# Patient Record
Sex: Male | Born: 1955 | Race: White | Hispanic: No | Marital: Married | Smoking: Never smoker
Health system: Southern US, Community
[De-identification: ages and names within clinical notes are randomized; demographics above are authoritative.]

## PROBLEM LIST (undated history)

## (undated) DIAGNOSIS — T7840XA Allergy, unspecified, initial encounter: Secondary | ICD-10-CM

## (undated) DIAGNOSIS — E039 Hypothyroidism, unspecified: Secondary | ICD-10-CM

## (undated) DIAGNOSIS — R7303 Prediabetes: Secondary | ICD-10-CM

## (undated) DIAGNOSIS — F419 Anxiety disorder, unspecified: Secondary | ICD-10-CM

## (undated) DIAGNOSIS — N50819 Testicular pain, unspecified: Secondary | ICD-10-CM

## (undated) DIAGNOSIS — E785 Hyperlipidemia, unspecified: Secondary | ICD-10-CM

## (undated) DIAGNOSIS — D496 Neoplasm of unspecified behavior of brain: Secondary | ICD-10-CM

## (undated) DIAGNOSIS — F32A Depression, unspecified: Secondary | ICD-10-CM

## (undated) DIAGNOSIS — F329 Major depressive disorder, single episode, unspecified: Secondary | ICD-10-CM

## (undated) DIAGNOSIS — K219 Gastro-esophageal reflux disease without esophagitis: Secondary | ICD-10-CM

## (undated) DIAGNOSIS — R51 Headache: Secondary | ICD-10-CM

## (undated) DIAGNOSIS — R0600 Dyspnea, unspecified: Secondary | ICD-10-CM

## (undated) DIAGNOSIS — E079 Disorder of thyroid, unspecified: Secondary | ICD-10-CM

## (undated) DIAGNOSIS — I669 Occlusion and stenosis of unspecified cerebral artery: Secondary | ICD-10-CM

## (undated) DIAGNOSIS — J189 Pneumonia, unspecified organism: Secondary | ICD-10-CM

## (undated) DIAGNOSIS — J45909 Unspecified asthma, uncomplicated: Secondary | ICD-10-CM

## (undated) DIAGNOSIS — M1990 Unspecified osteoarthritis, unspecified site: Secondary | ICD-10-CM

## (undated) DIAGNOSIS — C61 Malignant neoplasm of prostate: Principal | ICD-10-CM

## (undated) HISTORY — PX: OTHER SURGICAL HISTORY: SHX169

## (undated) HISTORY — DX: Unspecified asthma, uncomplicated: J45.909

## (undated) HISTORY — DX: Hyperlipidemia, unspecified: E78.5

## (undated) HISTORY — DX: Neoplasm of unspecified behavior of brain: D49.6

## (undated) HISTORY — DX: Allergy, unspecified, initial encounter: T78.40XA

## (undated) HISTORY — DX: Gastro-esophageal reflux disease without esophagitis: K21.9

## (undated) HISTORY — DX: Major depressive disorder, single episode, unspecified: F32.9

## (undated) HISTORY — DX: Depression, unspecified: F32.A

---

## 1991-12-31 DIAGNOSIS — D496 Neoplasm of unspecified behavior of brain: Secondary | ICD-10-CM

## 1991-12-31 HISTORY — PX: OTHER SURGICAL HISTORY: SHX169

## 1991-12-31 HISTORY — DX: Neoplasm of unspecified behavior of brain: D49.6

## 1999-10-11 ENCOUNTER — Encounter: Admission: RE | Admit: 1999-10-11 | Discharge: 1999-10-11 | Payer: Self-pay | Admitting: *Deleted

## 2001-05-08 ENCOUNTER — Encounter: Payer: Self-pay | Admitting: Endocrinology

## 2001-05-08 ENCOUNTER — Encounter: Admission: RE | Admit: 2001-05-08 | Discharge: 2001-05-08 | Payer: Self-pay | Admitting: Endocrinology

## 2003-07-08 ENCOUNTER — Encounter: Payer: Self-pay | Admitting: Endocrinology

## 2003-07-08 ENCOUNTER — Encounter: Admission: RE | Admit: 2003-07-08 | Discharge: 2003-07-08 | Payer: Self-pay | Admitting: Endocrinology

## 2004-11-15 ENCOUNTER — Ambulatory Visit: Payer: Self-pay | Admitting: Internal Medicine

## 2004-12-17 ENCOUNTER — Ambulatory Visit: Payer: Self-pay | Admitting: Internal Medicine

## 2006-02-10 ENCOUNTER — Ambulatory Visit: Payer: Self-pay | Admitting: Internal Medicine

## 2006-03-12 ENCOUNTER — Ambulatory Visit: Payer: Self-pay | Admitting: Internal Medicine

## 2006-03-13 ENCOUNTER — Encounter: Admission: RE | Admit: 2006-03-13 | Discharge: 2006-03-13 | Payer: Self-pay | Admitting: Internal Medicine

## 2006-07-29 ENCOUNTER — Ambulatory Visit (HOSPITAL_COMMUNITY): Admission: RE | Admit: 2006-07-29 | Discharge: 2006-07-29 | Payer: Self-pay | Admitting: Endocrinology

## 2006-11-18 ENCOUNTER — Ambulatory Visit: Payer: Self-pay | Admitting: Internal Medicine

## 2007-04-23 ENCOUNTER — Ambulatory Visit: Payer: Self-pay | Admitting: Internal Medicine

## 2008-03-18 ENCOUNTER — Ambulatory Visit: Payer: Self-pay | Admitting: Internal Medicine

## 2008-03-18 DIAGNOSIS — M25519 Pain in unspecified shoulder: Secondary | ICD-10-CM

## 2008-03-18 DIAGNOSIS — M542 Cervicalgia: Secondary | ICD-10-CM

## 2008-03-18 DIAGNOSIS — E039 Hypothyroidism, unspecified: Secondary | ICD-10-CM | POA: Insufficient documentation

## 2008-03-18 DIAGNOSIS — J45909 Unspecified asthma, uncomplicated: Secondary | ICD-10-CM

## 2008-05-02 ENCOUNTER — Telehealth (INDEPENDENT_AMBULATORY_CARE_PROVIDER_SITE_OTHER): Payer: Self-pay | Admitting: *Deleted

## 2008-05-05 ENCOUNTER — Telehealth (INDEPENDENT_AMBULATORY_CARE_PROVIDER_SITE_OTHER): Payer: Self-pay | Admitting: *Deleted

## 2008-06-27 ENCOUNTER — Telehealth (INDEPENDENT_AMBULATORY_CARE_PROVIDER_SITE_OTHER): Payer: Self-pay | Admitting: *Deleted

## 2008-07-08 ENCOUNTER — Encounter: Admission: RE | Admit: 2008-07-08 | Discharge: 2008-07-08 | Payer: Self-pay | Admitting: Neurosurgery

## 2008-10-13 ENCOUNTER — Ambulatory Visit: Payer: Self-pay | Admitting: Internal Medicine

## 2008-10-13 DIAGNOSIS — D179 Benign lipomatous neoplasm, unspecified: Secondary | ICD-10-CM | POA: Insufficient documentation

## 2008-10-14 ENCOUNTER — Telehealth (INDEPENDENT_AMBULATORY_CARE_PROVIDER_SITE_OTHER): Payer: Self-pay | Admitting: *Deleted

## 2008-11-02 ENCOUNTER — Encounter: Payer: Self-pay | Admitting: Internal Medicine

## 2008-12-15 ENCOUNTER — Telehealth (INDEPENDENT_AMBULATORY_CARE_PROVIDER_SITE_OTHER): Payer: Self-pay | Admitting: *Deleted

## 2009-03-14 ENCOUNTER — Telehealth (INDEPENDENT_AMBULATORY_CARE_PROVIDER_SITE_OTHER): Payer: Self-pay | Admitting: *Deleted

## 2009-07-24 ENCOUNTER — Ambulatory Visit: Payer: Self-pay | Admitting: Internal Medicine

## 2009-07-24 DIAGNOSIS — R351 Nocturia: Secondary | ICD-10-CM | POA: Insufficient documentation

## 2009-07-24 DIAGNOSIS — H6980 Other specified disorders of Eustachian tube, unspecified ear: Secondary | ICD-10-CM

## 2009-09-12 ENCOUNTER — Telehealth (INDEPENDENT_AMBULATORY_CARE_PROVIDER_SITE_OTHER): Payer: Self-pay | Admitting: *Deleted

## 2009-09-14 ENCOUNTER — Ambulatory Visit: Payer: Self-pay | Admitting: Internal Medicine

## 2009-09-14 DIAGNOSIS — J019 Acute sinusitis, unspecified: Secondary | ICD-10-CM | POA: Insufficient documentation

## 2009-12-06 ENCOUNTER — Ambulatory Visit: Payer: Self-pay | Admitting: Internal Medicine

## 2009-12-06 DIAGNOSIS — J069 Acute upper respiratory infection, unspecified: Secondary | ICD-10-CM | POA: Insufficient documentation

## 2010-02-19 ENCOUNTER — Telehealth (INDEPENDENT_AMBULATORY_CARE_PROVIDER_SITE_OTHER): Payer: Self-pay | Admitting: *Deleted

## 2010-10-23 ENCOUNTER — Encounter: Payer: Self-pay | Admitting: Internal Medicine

## 2010-12-20 ENCOUNTER — Telehealth: Payer: Self-pay | Admitting: Internal Medicine

## 2011-01-31 NOTE — Progress Notes (Signed)
Summary: Refill (Not on med list)  Phone Note Refill Request Message from:  Fax from Pharmacy on December 20, 2010 9:50 AM  Refills Requested: Medication #1:  PROAIR HFA 90 MCG INHALER USE 1 OR 2 SPRAYS EVERY 4 HOURS AS NEEDED [facility] - fax [phone]  Initial call taken by: Okey Regal [place],  December 20, 2010 9:52 AM  Follow-up for Phone Call        Not on med list, [facility]opp please advise.    **Patient is due for appointment** Follow-up by: Shonna Chock CMA,  December 20, 2010 10:56 AM  Additional Follow-up for Phone Call Additional follow up Details #1::        OK X1 Additional Follow-up by: Marga Melnick MD,  December 20, 2010 4:37 PM    New/Updated Medications: PROAIR HFA 108 (90 BASE) MCG/ACT AERS (ALBUTEROL SULFATE) USE 1 OR 2 SPRAYS EVERY 4 HOURS AS NEEDED Prescriptions: PROAIR HFA 108 (90 BASE) MCG/ACT AERS (ALBUTEROL SULFATE) USE 1 OR 2 SPRAYS EVERY 4 HOURS AS NEEDED  #1 month x 1   Entered by:   Lucious Groves CMA   Authorized by:   Marga Melnick MD   Signed by:   Lucious Groves CMA on 12/20/2010   Method used:   Faxed to ...       [facility]* (retail)       [address]       [place], [place]  [id number]       Ph: [phone]       Fax: [phone]   RxID:   [id number] PROAIR HFA 108 (90 BASE) MCG/ACT AERS (ALBUTEROL SULFATE) USE 1 OR 2 SPRAYS EVERY 4 HOURS AS NEEDED  #1 month x 1   Entered by:   Lucious Groves CMA   Authorized by:   Marga Melnick MD   Signed by:   Lucious Groves CMA on 12/20/2010   Method used:   Faxed to ...       [facility]* (retail)       [address]       [place], [place]  [id number]       Ph: [phone]       Fax: [phone]   RxID:   [id number]

## 2011-01-31 NOTE — Progress Notes (Signed)
Summary: Refill Request  Phone Note Refill Request Message from:  Pharmacy on [facility] Fax #: [phone]  Refills Requested: Medication #1:  ADVAIR DISKUS 250-50 MCG/DOSE  MISC 1 puff q 12 hours   Dosage confirmed as above?Dosage Confirmed   Last Refilled: 01/08/2010 Initial call taken by: Harold Barban,  February 19, 2010 8:57 AM    Prescriptions: ADVAIR DISKUS 250-50 MCG/DOSE  MISC (FLUTICASONE-SALMETEROL) 1 puff q 12 hours  #60 Each x 2   Entered by:   Shonna Chock   Authorized by:   Marga Melnick MD   Signed by:   Shonna Chock on 02/19/2010   Method used:   Electronically to        [facility]* (retail)       [address]       [place], [place]  [id number]       Ph: [phone]       Fax: [phone]   RxID:   [id number]

## 2011-01-31 NOTE — Miscellaneous (Signed)
Summary: [facility]  [facility]   Imported By: Lanelle Bal 11/07/2010 08:38:01  _____________________________________________________________________  External Attachment:    Type:   Image     Comment:   External Document

## 2011-03-29 ENCOUNTER — Other Ambulatory Visit: Payer: Self-pay | Admitting: Internal Medicine

## 2011-03-29 MED ORDER — FEXOFENADINE HCL 60 MG PO TABS: 60.0000 mg | ORAL_TABLET | Freq: Every day | ORAL | Status: DC

## 2011-03-29 MED ORDER — CARISOPRODOL 350 MG PO TABS: 350.0000 mg | ORAL_TABLET | Freq: Four times a day (QID) | ORAL | Status: AC | PRN

## 2011-03-29 MED ORDER — FLUTICASONE-SALMETEROL 250-50 MCG/DOSE IN AEPB: INHALATION_SPRAY | RESPIRATORY_TRACT | Status: DC

## 2011-03-29 MED ORDER — MONTELUKAST SODIUM 10 MG PO TABS: 10.0000 mg | ORAL_TABLET | Freq: Every day | ORAL | Status: DC

## 2011-05-17 NOTE — Assessment & Plan Note (Signed)
[facility]                        [place] OFFICE NOTE   Shannon, Kirkendall DREWEY BEGUE                         MRN:          [id number]  DATE:04/23/2007                            DOB:          01/13/1956    [facility]); date of birth [birth year]; unit #[id number] was  seen for followup of his reactive airways disease and extrinsic  rhinoconjunctivitis symptoms April 23, 2007.  His asthma is stable,  manifested by absence of paroxysmal nocturnal dyspnea > 2X /month or use  of a rescue inhaler more than twice a week.  He refills the albuterol  metered dose inhaler less than three times per year.   He has minor extrinsic symptoms for which he takes Allegra 60 mg as  needed.   He is intolerant to [facility] and has reactive airway symptoms with  feathers or dust.   His past medical history is unchanged.  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  Upper  endoscopy in 2005 was negative.  He also has dyslipidemia and  hypothyroidism and is followed by Dr. Ardyth Harps, Endocrinologist.   Uncles may have had asthma.  His father has had prostate cancer and  treated with radioactive implants.  His mother has had five-vessel  bypass.   He has never smoked and rarely drinks.   Other medications include:  1. Advair 100/50 every 12 hours.  2. Singulair 10 mg at bedtime.  3. Prevacid 30 mg daily.  4. Levothroid 75 mcg daily.  5. Neurontin 300 mg twice a day.  6. Etodolac 400 mg as needed.  7. Vytorin 10/40 at bedtime.   The review of systems reveals that his reflux symptoms are well  controlled with the Prevacid.  He specifically denies dysphagia or  rectal bleeding.Triggers for reflux were discussed; these would include  Etodolac.   He has had no prostate symptoms.  He believes that Dr. Evlyn Kanner has checked  his prostate within the last year.  He is unsure whether a PSA was done.   He is 5 feet 6 inches  and weighs 180, which is stable.  The  otolaryngologic exam was unremarkable.  Specifically, he does not have  nasal polyps.  Dental hygiene is good.   He has no lymphadenopathy about the head, neck or axilla.   An S4 is noted.  Chest is clear with no increased work of breathing or  wheezing.   Abdomen is nontender with no organomegaly.   Medications will be renewed for a year.  To guarantee continuity of  care, I will send a copy of this to Dr. Adrian Prince to verify that  digital rectal exam and PSA surveillance has been conducted in view of  his father's history of prostate cancer.  With such a history, it would  be important to begin the PSAs prior to age 77.     Titus Dubin. Alwyn Ren, MD,FACP,FCCP  Electronically Signed    [facility]  DD: 04/23/2007  DT: 04/23/2007  Job #: [id number]   cc:  Tera Mater. Evlyn Kanner, M.D.

## 2011-06-04 ENCOUNTER — Encounter: Payer: Self-pay | Admitting: Internal Medicine

## 2011-06-04 ENCOUNTER — Ambulatory Visit (INDEPENDENT_AMBULATORY_CARE_PROVIDER_SITE_OTHER): Payer: 59 | Admitting: Internal Medicine

## 2011-06-04 VITALS — BP 116/80 | HR 64 | Temp 98.2°F | Resp 14 | Ht 65.75 in | Wt 180.4 lb

## 2011-06-04 DIAGNOSIS — J45909 Unspecified asthma, uncomplicated: Secondary | ICD-10-CM

## 2011-06-04 DIAGNOSIS — R51 Headache: Secondary | ICD-10-CM

## 2011-06-04 DIAGNOSIS — K219 Gastro-esophageal reflux disease without esophagitis: Secondary | ICD-10-CM

## 2011-06-04 DIAGNOSIS — E785 Hyperlipidemia, unspecified: Secondary | ICD-10-CM

## 2011-06-04 DIAGNOSIS — Z0000 Encounter for general adult medical examination without abnormal findings: Secondary | ICD-10-CM

## 2011-06-04 DIAGNOSIS — D332 Benign neoplasm of brain, unspecified: Secondary | ICD-10-CM

## 2011-06-04 DIAGNOSIS — R519 Headache, unspecified: Secondary | ICD-10-CM

## 2011-06-04 DIAGNOSIS — E039 Hypothyroidism, unspecified: Secondary | ICD-10-CM

## 2011-06-04 MED ORDER — MONTELUKAST SODIUM 10 MG PO TABS: 10.0000 mg | ORAL_TABLET | Freq: Every day | ORAL | Status: DC

## 2011-06-04 MED ORDER — FLUTICASONE-SALMETEROL 250-50 MCG/DOSE IN AEPB: INHALATION_SPRAY | RESPIRATORY_TRACT | Status: DC

## 2011-06-04 MED ORDER — ALBUTEROL SULFATE HFA 108 (90 BASE) MCG/ACT IN AERS: 2.0000 | INHALATION_SPRAY | Freq: Four times a day (QID) | RESPIRATORY_TRACT | Status: DC | PRN

## 2011-06-04 NOTE — Progress Notes (Signed)
  Subjective:    Patient ID: Adam Sullivan, male    DOB: January 15, 1956, 55 y.o.   MRN: [id number]  HPI Adam Sullivan is here for refill of Asthma meds. Sputum production:no Hemoptysis:no Dyspnea (rest/exertional/PND):only DOE , but stable Wheezing:no Chest pain, edema, palpitations:no Treatment/efficacy: Use of rescue inhaler:2 X / month on average Use of maintenance inhaler:Advair bid with control of asthma Smoking:never Past medical history:  Allergies; seasonal : Singulair prn only Family history pulmonary disease: ? Uncle had asthma    Review of Systems GERD is well controlled with PPI; no dysphagia, hoarseness, rectal bleeding or melena. No GU symptoms ; Dr Evlyn Kanner monitors PSA. No Colonoscopy to date. SOC reviewed.     Objective:   Physical Exam General appearance is of good health and nourishment; no acute distress or increased work of breathing is present.  No  lymphadenopathy about the head, neck, or axilla noted. Pattern  alopecia  Eyes: No conjunctival inflammation or lid edema is present. There is no scleral icterus.  Ears:  External ear exam shows no significant lesions or deformities.  Otoscopic examination reveals clear canals, tympanic membranes are intact bilaterally without bulging, retraction, inflammation or discharge.  Nose:  External nasal examination shows no deformity or inflammation. Nasal mucosa are pink and moist without lesions or exudates. No septal dislocation or dislocation.No obstruction to airflow.   Oral exam: Dental hygiene is good; lips and gums are healthy appearing.There is no oropharyngeal erythema or exudate noted.   Neck:  No deformities, thyromegaly, masses, or tenderness noted.   Supple with full range of motion without pain. Shunt palpable R anterior neck  Heart:  Normal rate and regular rhythm. S1 and S2 normal without gallop, click, rub or other extra sounds.  Grade 1/2 systolic   R base   Lungs:Chest clear to auscultation; no wheezes,  rhonchi,rales ,or rubs present.No increased work of breathing.    Extremities:  No cyanosis, edema, or clubbing  noted    Skin: Warm & dry w/o jaundice or tenting. Fleshly polyp R neck area. Striae over abdomen          Assessment & Plan:  #1 asthma , well controlled #2 GERD #3 hypothyroidism #4 dyslipidemia Plan : renew meds Please consider Screening Colonoscopy

## 2011-06-04 NOTE — Patient Instructions (Signed)
Preventive Health Care: Attempt exercise at least 30-45 minutes a day,  3-4 days a week.  Eat a low-fat diet with lots of fruits and vegetables, up to 7-9 servings per day. Avoid obesity; your goal is waist measurement < 40 inches.Consume less than 40 grams of sugar per day from foods & drinks with High Fructose Corn Sugar as #2,3 or # 4 on label. Health Care Power of Attorney & Living Will. Complete if not in place ; these place you in charge of your health care decisions. Verify prescribing MD for all meds. Consider Screening Colonoscopy. Verify  PSA monitored by Dr Evlyn Kanner due to father's history.

## 2011-06-04 NOTE — Assessment & Plan Note (Signed)
As per Dr Evlyn Kanner

## 2011-07-22 ENCOUNTER — Other Ambulatory Visit: Payer: Self-pay | Admitting: Internal Medicine

## 2012-02-16 ENCOUNTER — Ambulatory Visit (INDEPENDENT_AMBULATORY_CARE_PROVIDER_SITE_OTHER): Payer: 59 | Admitting: Family Medicine

## 2012-02-16 DIAGNOSIS — G43909 Migraine, unspecified, not intractable, without status migrainosus: Secondary | ICD-10-CM

## 2012-02-16 MED ORDER — KETOROLAC TROMETHAMINE 60 MG/2ML IM SOLN
60.0000 mg | Freq: Once | INTRAMUSCULAR | Status: AC
  Administered 2012-02-16: 60 mg via INTRAMUSCULAR

## 2012-02-16 MED ORDER — PROMETHAZINE HCL 25 MG/ML IJ SOLN
25.0000 mg | Freq: Four times a day (QID) | INTRAMUSCULAR | Status: DC | PRN
  Administered 2012-02-16: 25 mg via INTRAMUSCULAR

## 2012-02-16 NOTE — Progress Notes (Signed)
  Subjective:    Patient ID: Adam Sullivan, male    DOB: [birth year], 56 y.o.   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  HA specialist was Dr. Charlott Holler, has since retired ([facility]).  On gabapentin for prophylactic med.  Ran out of etodolac, which he takes prn.  Started yesterday.  Making him nauseated.  Hasn't had migraine in several years.  Does have frequent headaches, but not like this one.  This one worse than usual headache - like a migraine.  Hurts to lay on right side of head - side shunt is on.  Pain bilateral, left > right.  Pressure.  Nausea.  Some disequilibrium.  NO vision disturbance.   No recent check up of shunt - hasn't seen neurosurgeon in years.    Review of Systems Negative except as per HPI     Objective:   Physical Exam  Constitutional: He is oriented to person, place, and time. He appears well-developed and well-nourished.  HENT:  Head: Normocephalic.  Right Ear: External ear normal.  Mouth/Throat: Oropharynx is clear and moist.  Eyes: Conjunctivae are normal. Pupils are equal, round, and reactive to light.  Fundoscopic exam:      The right eye shows no papilledema.       The left eye shows no papilledema.  Neck: Normal range of motion. Neck supple.  Cardiovascular: Normal rate, regular rhythm, normal heart sounds and intact distal pulses.   No murmur heard. Pulmonary/Chest: Effort normal and breath sounds normal.  Neurological: He is alert and oriented to person, place, and time. He has normal strength. No cranial nerve deficit.  Skin: Skin is warm and dry.          Assessment & Plan:  Migraine, complicated by shunt from h/o benign brain tumor.  Well-appearing here today with normal exam.  Toradol and phenergan.  If HA still there tomorrow, call and set up for CT scan +/- shunt series.  Also, advised [facility].

## 2012-02-20 ENCOUNTER — Telehealth: Payer: Self-pay

## 2012-02-20 NOTE — Telephone Encounter (Signed)
Patient of Dr. Georgiana Shore would like for her to do a referral for him to see a specialist Dr. Gerlene Fee concerning a shunt, that they discussed when he was her for an appt.

## 2012-02-20 NOTE — Telephone Encounter (Signed)
Please get more information - headache better or not?  What hospital system/office is this doctor with?

## 2012-02-22 NOTE — Telephone Encounter (Signed)
LMOM for pt to CB with status.

## 2012-02-25 NOTE — Telephone Encounter (Signed)
LMOM at H and C #s to CB

## 2012-03-16 ENCOUNTER — Other Ambulatory Visit: Payer: Self-pay | Admitting: Internal Medicine

## 2012-09-01 ENCOUNTER — Telehealth: Payer: Self-pay | Admitting: Internal Medicine

## 2012-09-01 MED ORDER — FLUTICASONE-SALMETEROL 250-50 MCG/DOSE IN AEPB: INHALATION_SPRAY | RESPIRATORY_TRACT | Status: DC

## 2012-09-01 NOTE — Telephone Encounter (Signed)
Patient will need to schedule a CPX  

## 2012-09-01 NOTE — Telephone Encounter (Signed)
Refill: Advair 250-50 diskus. Use 1 inhalation every 12 hours. Qty 60. Last fill 07-12-12 °

## 2012-10-02 ENCOUNTER — Other Ambulatory Visit: Payer: Self-pay

## 2012-10-02 ENCOUNTER — Other Ambulatory Visit: Payer: Self-pay | Admitting: Internal Medicine

## 2012-10-02 MED ORDER — FLUTICASONE-SALMETEROL 250-50 MCG/DOSE IN AEPB: INHALATION_SPRAY | RESPIRATORY_TRACT | Status: DC

## 2012-10-02 NOTE — Telephone Encounter (Signed)
I responded to this rx electronically. I also responded to a manual fax that was received, I informed pharmacy on manual fax that the system would not allow me to link patient due to difference in names, they have the patient listed as [name] (middle name) and we have him by his first and last name. After several attempts to link patient (they only way to respond to rx) I had to decline it for it would not accept it.  I called Kim ( pharmacist) and explained this process and she verbalized understanding and stated she finally received the response where we approved rx x 1.

## 2012-10-02 NOTE — Telephone Encounter (Signed)
Kim from [facility] is calling very upset that prescription has been denied multiple times stating that Alwyn Ren is not the patient's doctor. She would like you to call her back, sorry amy

## 2012-11-03 ENCOUNTER — Other Ambulatory Visit: Payer: Self-pay | Admitting: Internal Medicine

## 2012-11-23 ENCOUNTER — Ambulatory Visit (INDEPENDENT_AMBULATORY_CARE_PROVIDER_SITE_OTHER): Payer: 59 | Admitting: Internal Medicine

## 2012-11-23 ENCOUNTER — Encounter: Payer: Self-pay | Admitting: Internal Medicine

## 2012-11-23 VITALS — BP 126/80 | HR 63 | Temp 97.5°F | Wt 181.4 lb

## 2012-11-23 DIAGNOSIS — Z1211 Encounter for screening for malignant neoplasm of colon: Secondary | ICD-10-CM

## 2012-11-23 DIAGNOSIS — J45909 Unspecified asthma, uncomplicated: Secondary | ICD-10-CM

## 2012-11-23 MED ORDER — FLUTICASONE-SALMETEROL 250-50 MCG/DOSE IN AEPB: INHALATION_SPRAY | RESPIRATORY_TRACT | Status: DC

## 2012-11-23 NOTE — Assessment & Plan Note (Signed)
Despite having to use his rescue inhaler twice a week; he wants to stay with Advair. If rescue inhaler use increases or if another agent has a favorable position on insurance ;change in therapy will be pursued.

## 2012-11-23 NOTE — Progress Notes (Signed)
  Subjective:    Patient ID: Adam Sullivan, male    DOB: Jun 29, 1956, 56 y.o.   MRN: [id number]  HPI  His asthma has been essentially well controlled on his maintenance Advair. He does use his rescue inhaler on average of 2 times per week or less.   Review of Systems  Allergic:Some minor itchy eyes.No sneezing, postnasal drainage,  angioedema Constitution:No fever, chills, sweats ENT: No frontal headache, facial pain, nasal purulence, sore throat, dental pain Cardiovascular: No palpitations, edema, calf swelling/ pain Respiratory: No  sputum, pleuritic chest pain, PNDyspnea. Intermittent dyspnea & occasional  wheezing GI: Some heartburn/dyspepsia. No  dysphagia. Control good with Protonix. He has deferred a colonoscopy; but he wishes to pursue it at this time. He has no unexplained weight loss, melena, rectal bleeding. Derm: No rash, urticaria/hives Heme/lymph: No lymphadenopathy       Objective:   Physical Exam General appearance:good health ;well nourished; no acute distress or increased work of breathing is present.  No  lymphadenopathy about the head, neck, or axilla noted.   Eyes: No conjunctival inflammation or lid edema is present. There is no scleral icterus.  Ears:  External ear exam shows no significant lesions or deformities.  Otoscopic examination reveals clear canals, tympanic membranes are intact bilaterally without bulging, retraction, inflammation or discharge.  Nose:  External nasal examination shows no deformity or inflammation. Nasal mucosa are pink and moist without lesions or exudates Small orifices ; R septal  deviation.Slight obstruction to airflow on R.   Oral exam: Dental hygiene is good; lips and gums are healthy appearing.There is no oropharyngeal erythema or exudate noted.   Neck:  No deformities,  masses, or tenderness noted.     Heart:  Normal rate and regular rhythm. S1 and S2 normal without gallop, murmur, click, rub or other extra sounds.   Lungs:Chest  clear to auscultation; no wheezes, rhonchi,rales ,or rubs present.No increased work of breathing.    Extremities:  No cyanosis or clubbing  noted . Trace ankle edema   Skin: Warm & dry               Assessment & Plan:

## 2012-11-23 NOTE — Patient Instructions (Addendum)
Review and correct the record as indicated. Please share record with all medical staff seen.   If you activate My Chart; the results can be released to you as soon as they populate from the lab. If you choose not to use this program; the labs have to be reviewed, copied & mailed   causing a delay in getting the results to you.  

## 2012-12-01 ENCOUNTER — Encounter: Payer: Self-pay | Admitting: Internal Medicine

## 2013-01-22 ENCOUNTER — Encounter: Payer: 59 | Admitting: Internal Medicine

## 2013-02-09 ENCOUNTER — Ambulatory Visit (AMBULATORY_SURGERY_CENTER): Payer: 59 | Admitting: *Deleted

## 2013-02-09 ENCOUNTER — Encounter: Payer: Self-pay | Admitting: Internal Medicine

## 2013-02-09 VITALS — Ht 66.0 in | Wt 184.8 lb

## 2013-02-09 DIAGNOSIS — Z1211 Encounter for screening for malignant neoplasm of colon: Secondary | ICD-10-CM

## 2013-02-09 MED ORDER — MOVIPREP 100 G PO SOLR: ORAL | Status: DC

## 2013-02-18 ENCOUNTER — Encounter: Payer: Self-pay | Admitting: Internal Medicine

## 2013-02-18 ENCOUNTER — Ambulatory Visit (AMBULATORY_SURGERY_CENTER): Payer: 59 | Admitting: Internal Medicine

## 2013-02-18 VITALS — BP 153/76 | HR 57 | Temp 97.6°F | Resp 16 | Ht 66.0 in | Wt 184.0 lb

## 2013-02-18 DIAGNOSIS — Z1211 Encounter for screening for malignant neoplasm of colon: Secondary | ICD-10-CM

## 2013-02-18 MED ORDER — SODIUM CHLORIDE 0.9 % IV SOLN: 500.0000 mL | INTRAVENOUS | Status: DC

## 2013-02-18 NOTE — Op Note (Signed)
 [facility] [address] [place], [address]   COLONOSCOPY PROCEDURE REPORT  PATIENT: Adam Sullivan, Limbach  MR#: [id number] BIRTHDATE: 07/22/1956 , 56  yrs. old GENDER: Male ENDOSCOPIST: Roxy Cedar, MD REFERRED XB:[id number] Alwyn Ren, M.D. PROCEDURE DATE:  02/18/2013 PROCEDURE:   Colonoscopy, screening ASA CLASS:   Class II INDICATIONS:Average risk patient for colon cancer. MEDICATIONS: MAC sedation, administered by CRNA and propofol (Diprivan) 250mg  IV  DESCRIPTION OF PROCEDURE:   After the risks benefits and alternatives of the procedure were thoroughly explained, informed consent was obtained.  A digital rectal exam revealed no abnormalities of the rectum.   The LB [id number]-[id number]  endoscope was introduced through the anus and advanced to the cecum, which was identified by both the appendix and ileocecal valve. No adverse events experienced.   The quality of the prep was excellent, using MoviPrep  The instrument was then slowly withdrawn as the colon was fully examined.      COLON FINDINGS: The mucosa appeared normal in the terminal ileum. A normal appearing cecum, ileocecal valve, and appendiceal orifice were identified.  The ascending, hepatic flexure, transverse, splenic flexure, descending, sigmoid colon and rectum appeared unremarkable.  No polyps or cancers were seen.  Retroflexed views revealed no abnormalities. The time to cecum=3 minutes 26 seconds. Withdrawal time=8 minutes 09 seconds.  The scope was withdrawn and the procedure completed. COMPLICATIONS: There were no complications.  ENDOSCOPIC IMPRESSION: 1.   Normal mucosa in the terminal ileum 2.   Normal colon  RECOMMENDATIONS: 1. Continue current colorectal screening recommendations for "routine risk" patients with a repeat colonoscopy in 10 years.   eSigned:  Roxy Cedar, MD 02/18/2013 11:57 AM   cc: Pecola Lawless, MD and The Patient

## 2013-02-18 NOTE — Progress Notes (Signed)
Patient did not experience any of the following events: a burn prior to discharge; a fall within the facility; wrong site/side/patient/procedure/implant event; or a hospital transfer or hospital admission upon discharge from the facility. (G8907) Patient did not have preoperative order for IV antibiotic SSI prophylaxis. (G8918)  

## 2013-02-18 NOTE — Patient Instructions (Addendum)
Discharge instructions given with instructions given with verbal understanding. Normal exam. Resume previous medications. YOU HAD AN ENDOSCOPIC PROCEDURE TODAY AT THE [facility]: Refer to the procedure report that was given to you for any specific questions about what was found during the examination.  If the procedure report does not answer your questions, please call your gastroenterologist to clarify.  If you requested that your care partner not be given the details of your procedure findings, then the procedure report has been included in a sealed envelope for you to review at your convenience later.  YOU SHOULD EXPECT: Some feelings of bloating in the abdomen. Passage of more gas than usual.  Walking can help get rid of the air that was put into your GI tract during the procedure and reduce the bloating. If you had a lower endoscopy (such as a colonoscopy or flexible sigmoidoscopy) you may notice spotting of blood in your stool or on the toilet paper. If you underwent a bowel prep for your procedure, then you may not have a normal bowel movement for a few days.  DIET: Your first meal following the procedure should be a light meal and then it is ok to progress to your normal diet.  A half-sandwich or bowl of soup is an example of a good first meal.  Heavy or fried foods are harder to digest and may make you feel nauseous or bloated.  Likewise meals heavy in dairy and vegetables can cause extra gas to form and this can also increase the bloating.  Drink plenty of fluids but you should avoid alcoholic beverages for 24 hours.  ACTIVITY: Your care partner should take you home directly after the procedure.  You should plan to take it easy, moving slowly for the rest of the day.  You can resume normal activity the day after the procedure however you should NOT DRIVE or use heavy machinery for 24 hours (because of the sedation medicines used during the test).    SYMPTOMS TO REPORT  IMMEDIATELY: A gastroenterologist can be reached at any hour.  During normal business hours, 8:30 AM to 5:00 PM Monday through Friday, call [phone].  After hours and on weekends, please call the GI answering service at [phone] who will take a message and have the physician on call contact you.   Following lower endoscopy (colonoscopy or flexible sigmoidoscopy):  Excessive amounts of blood in the stool  Significant tenderness or worsening of abdominal pains  Swelling of the abdomen that is new, acute  Fever of 100F or higher  FOLLOW UP: If any biopsies were taken you will be contacted by phone or by letter within the next 1-3 weeks.  Call your gastroenterologist if you have not heard about the biopsies in 3 weeks.  Our staff will call the home number listed on your records the next business day following your procedure to check on you and address any questions or concerns that you may have at that time regarding the information given to you following your procedure. This is a courtesy call and so if there is no answer at the home number and we have not heard from you through the emergency physician on call, we will assume that you have returned to your regular daily activities without incident.  SIGNATURES/CONFIDENTIALITY: You and/or your care partner have signed paperwork which will be entered into your electronic medical record.  These signatures attest to the fact that that the information above on your After Visit Summary  has been reviewed and is understood.  Full responsibility of the confidentiality of this discharge information lies with you and/or your care-partner. 

## 2013-02-19 ENCOUNTER — Telehealth: Payer: Self-pay

## 2013-02-19 NOTE — Telephone Encounter (Signed)
  Follow up Call-  Call back number 02/18/2013  Post procedure Call Back phone  # [phone]  Permission to leave phone message Yes     Patient questions:  Do you have a fever, pain , or abdominal swelling? no Pain Score  0 *  Have you tolerated food without any problems? yes  Have you been able to return to your normal activities? yes  Do you have any questions about your discharge instructions: Diet   no Medications  no Follow up visit  no  Do you have questions or concerns about your Care? no  Actions: * If pain score is 4 or above: No action needed, pain <4.

## 2013-07-01 DIAGNOSIS — C61 Malignant neoplasm of prostate: Secondary | ICD-10-CM | POA: Insufficient documentation

## 2013-07-01 HISTORY — DX: Malignant neoplasm of prostate: C61

## 2013-08-25 ENCOUNTER — Encounter: Payer: Self-pay | Admitting: Radiation Oncology

## 2013-08-25 NOTE — Progress Notes (Signed)
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 no  Pacemaker/ICD? no  Possible current pregnancy? na  Is the patient on methotrexate? no  Current Complaints / other details: librarian, married, 2 sons

## 2013-08-26 ENCOUNTER — Encounter: Payer: Self-pay | Admitting: Radiation Oncology

## 2013-08-26 ENCOUNTER — Ambulatory Visit
Admission: RE | Admit: 2013-08-26 | Discharge: 2013-08-26 | Disposition: A | Discharge status: Home / Self Care | Payer: 59 | Source: Ambulatory Visit | Attending: Radiation Oncology | Admitting: Radiation Oncology

## 2013-08-26 VITALS — BP 137/69 | HR 63 | Temp 97.9°F | Ht 66.0 in | Wt 180.9 lb

## 2013-08-26 DIAGNOSIS — C61 Malignant neoplasm of prostate: Secondary | ICD-10-CM

## 2013-08-26 HISTORY — DX: Malignant neoplasm of prostate: C61

## 2013-08-26 HISTORY — DX: Disorder of thyroid, unspecified: E07.9

## 2013-08-26 HISTORY — DX: Testicular pain, unspecified: N50.819

## 2013-08-26 NOTE — Progress Notes (Signed)
[facility] Radiation Oncology NEW PATIENT EVALUATION  Name: Adam Sullivan MRN: [id number]  Date:   08/26/2013           DOB: 1956-03-17  Status: outpatient   CC: Marga Melnick, MD  Sebastian Ache, MD Dr. Adrian Prince   REFERRING PHYSICIAN: Sebastian Ache, MD   DIAGNOSIS:  Stage TI C. favorable risk adenocarcinoma prostate.  HISTORY OF PRESENT ILLNESS:  Adam Sullivan is a 57 y.o. male who is seen today for the courtesy of Dr. Berneice Heinrich for discussion of possible radiation in the management of his stage TI C. favorable risk adenocarcinoma prostate. He underwent PSA screening with a positive family history of prostate cancer in his father who underwent seed implantation many years ago here at [facility]. His PSA was 2.14 in  2008, rising to 2.73 by 2011, and more recently 4.2 in June 2014. He was seen by Dr. Berneice Heinrich who performed ultrasound-guided biopsies on 07/01/2013. He had Gleason score 6 (3+3) involving less than 5% of one core from right lateral mid gland, 60% of one core from the right lateral apex, 20% of one core from the right apex. His gland volume was 13.4 cc. He is doing well from a GU and GI standpoint. His I PSS score is  6. 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  PREVIOUS RADIATION THERAPY: History of postoperative radiation therapy to his pineal tumor bed approximately 13 years ago at [facility]. I may have given his postoperative radiation therapy and his records will be reviewed.   PAST MEDICAL HISTORY:  has a past medical history of Asthma; Allergy; Depression; GERD (gastroesophageal reflux disease); Hyperlipidemia; Hypertension; Brain tumor (1993); Prostate cancer (07/01/13); Thyroid disease; and Orchalgia.     PAST SURGICAL HISTORY:  Past Surgical History  Procedure Laterality Date  . Removal brain tumor   1993    pineal tumor, benign  . Brain shunt  1993     FAMILY HISTORY: family history includes Asthma in his maternal uncle; Cancer in his father; Coronary artery disease (age of onset: 77) in his mother; Testicular cancer in his father. There is no history of Colon cancer or Stomach cancer.   SOCIAL HISTORY:  reports that he has never smoked. He has never used smokeless tobacco. He reports that  drinks alcohol. He reports that he does not use illicit drugs.   ALLERGIES: Dust mite extract and Erythromycin   MEDICATIONS:  Current Outpatient Prescriptions  Medication Sig Dispense Refill  . metaxalone (SKELAXIN) 800 MG tablet Take 800 mg by mouth 3 (three) times daily.      . montelukast (SINGULAIR) 10 MG tablet Take 10 mg by mouth at bedtime.      Marland Kitchen albuterol (VENTOLIN HFA) 108 (90 BASE) MCG/ACT inhaler Inhale 2 puffs into the lungs every 6 (six) hours as needed for wheezing. 1-2 puffs every 4-6 hrs prn  18 g  2  . etodolac (LODINE) 400 MG tablet Take 400 mg by mouth as needed. Dr.Freeman      . ezetimibe-simvastatin (VYTORIN) 10-40 MG per tablet Take 1 tablet by mouth at bedtime.        . fexofenadine (ALLEGRA) 180 MG tablet Take 180 mg by mouth as needed.      . Fluticasone-Salmeterol (ADVAIR DISKUS) 250-50 MCG/DOSE AEPB one inhalation every 12 hours; gargle and spit after use  60 each  11  . gabapentin (NEURONTIN) 300 MG capsule Take 300 mg by mouth 3 (three) times daily.        Marland Kitchen levothyroxine (SYNTHROID, LEVOTHROID) 75 MCG tablet Take 75 mcg by mouth daily.        . pantoprazole (PROTONIX) 40 MG tablet Take 40 mg by mouth 2 (two) times daily.        Marland Kitchen tiZANidine (ZANAFLEX) 4 MG tablet Take 4 mg by mouth. 1/2-2 by mouth three times daily as needed for headache. RX'ed by Dr.Freeman       No current facility-administered medications for this encounter.     REVIEW OF SYSTEMS:  Pertinent items are noted in HPI.    PHYSICAL EXAM:  height is 5\' 6"  (1.676 m) and weight is 180 lb 14.4  oz (82.056 kg). His temperature is 97.9 F (36.6 C). His blood pressure is 137/69 and his pulse is 63.   Alert and oriented. He's not examined today.   LABORATORY DATA:  No results found for this basename: WBC, HGB, HCT, MCV, PLT   No results found for this basename: NA, K, CL, CO2   No results found for this basename: ALT, AST, GGT, ALKPHOS, BILITOT   PSA 4.2 from 06/07/2013   IMPRESSION: Clinical stage TI C. favorable risk adenocarcinoma prostate. I explained to the patient and his wife that his prognosis is related to his stage, PSA level, and Gleason score. All are favorable. Other prognostic factors include PSA doubling time and disease volume, and these are also favorable. We discussed surgery versus close surveillance versus radiation therapy. Considering his relatively young age of 24, I do recommend curative treatment. Radiation therapy options are limited to external beam/IMRT since his gland is too small for seed implantation. We discussed the potential acute and late toxicities of external beam/IMRT. My personal bias is towards surgery in patients under age 21.  Surgery  is well tolerated. His prognosis is excellent. It would be extremely unlikely that he would require post prostatectomy radiation therapy.   PLAN: As discussed above.  I spent 40 minutes minutes face to face with the patient and more than 50% of that time was spent in counseling and/or coordination of care.

## 2013-08-26 NOTE — Addendum Note (Signed)
Encounter addended by: Delynn Flavin, RN on: 08/26/2013  2:19 PM<BR>     Documentation filed: Inpatient Document Flowsheet

## 2013-09-09 ENCOUNTER — Other Ambulatory Visit: Payer: Self-pay | Admitting: Urology

## 2013-09-20 ENCOUNTER — Other Ambulatory Visit: Payer: Self-pay | Admitting: Urology

## 2013-09-27 ENCOUNTER — Other Ambulatory Visit: Payer: Self-pay | Admitting: Urology

## 2013-09-29 ENCOUNTER — Encounter (HOSPITAL_COMMUNITY): Admission: RE | Payer: Self-pay | Source: Ambulatory Visit

## 2013-09-29 ENCOUNTER — Inpatient Hospital Stay (HOSPITAL_COMMUNITY): Admission: RE | Admit: 2013-09-29 | Payer: 59 | Source: Ambulatory Visit | Admitting: Urology

## 2013-09-29 SURGERY — ROBOTIC ASSISTED LAPAROSCOPIC RADICAL PROSTATECTOMY
Anesthesia: General

## 2013-10-19 ENCOUNTER — Encounter (HOSPITAL_COMMUNITY): Payer: Self-pay | Admitting: Pharmacy Technician

## 2013-10-19 NOTE — Patient Instructions (Signed)
[address]OS[address]FLEET  10/19/2013   Your procedure is scheduled on: 10/22/13  Report to [facility] at 5:15 AM.  Call this number if you have problems the morning of surgery [phone]: [phone]   Remember: please bring inhaler on the day of surgery   Do not eat food or drink liquids After Midnight.     Take these medicines the morning of surgery with A SIP OF WATER: gabapentin, synthroid, protonix, Bactrim    Do not wear jewelry, make-up or nail polish.  Do not wear lotions, powders, or perfumes. You may wear deodorant.  Do not shave 48 hours prior to surgery. Men may shave face and neck.  Do not bring valuables to the hospital.  Contacts, dentures or bridgework may not be worn into surgery.  Leave suitcase in the car. After surgery it may be brought to your room.  For patients admitted to the hospital, checkout time is 11:00 AM the day of discharge.    Please read over the following fact sheets that you were given: blood fact sheet Birdie Sons, RN  pre op nurse call if needed [phone]    FAILURE TO FOLLOW THESE INSTRUCTIONS MAY RESULT IN CANCELLATION OF YOUR SURGERY   Patient Signature: ___________________________________________

## 2013-10-19 NOTE — Progress Notes (Signed)
LOV note 09/20/13 Dr. Evlyn Kanner on chart

## 2013-10-20 ENCOUNTER — Encounter (HOSPITAL_COMMUNITY): Payer: Self-pay

## 2013-10-20 ENCOUNTER — Encounter (HOSPITAL_COMMUNITY)
Admission: RE | Admit: 2013-10-20 | Discharge: 2013-10-20 | Disposition: A | Discharge status: Home / Self Care | Payer: 59 | Source: Ambulatory Visit | Attending: Urology | Admitting: Urology

## 2013-10-20 ENCOUNTER — Ambulatory Visit (HOSPITAL_COMMUNITY)
Admission: RE | Admit: 2013-10-20 | Discharge: 2013-10-20 | Disposition: A | Discharge status: Home / Self Care | Payer: 59 | Source: Ambulatory Visit | Attending: Urology | Admitting: Urology

## 2013-10-20 DIAGNOSIS — Z01818 Encounter for other preprocedural examination: Secondary | ICD-10-CM | POA: Insufficient documentation

## 2013-10-20 DIAGNOSIS — Z01812 Encounter for preprocedural laboratory examination: Secondary | ICD-10-CM | POA: Insufficient documentation

## 2013-10-20 HISTORY — DX: Pneumonia, unspecified organism: J18.9

## 2013-10-20 HISTORY — DX: Occlusion and stenosis of unspecified cerebral artery: I66.9

## 2013-10-20 HISTORY — DX: Headache: R51

## 2013-10-20 HISTORY — DX: Anxiety disorder, unspecified: F41.9

## 2013-10-20 LAB — BASIC METABOLIC PANEL
BUN: 13 mg/dL (ref 6–23)
CO2: 27 mEq/L (ref 19–32)
Chloride: 96 mEq/L (ref 96–112)
Glucose, Bld: 94 mg/dL (ref 70–99)
Potassium: 4.3 mEq/L (ref 3.5–5.1)
Sodium: 132 mEq/L — ABNORMAL LOW (ref 135–145)

## 2013-10-20 LAB — ABO/RH: ABO/RH(D): O POS

## 2013-10-20 LAB — CBC
HCT: 38 % — ABNORMAL LOW (ref 39.0–52.0)
Hemoglobin: 12.9 g/dL — ABNORMAL LOW (ref 13.0–17.0)
RBC: 4.37 MIL/uL (ref 4.22–5.81)

## 2013-10-21 MED ORDER — GENTAMICIN SULFATE 40 MG/ML IJ SOLN
360.0000 mg | INTRAVENOUS | Status: AC
  Administered 2013-10-22: 360 mg via INTRAVENOUS
  Filled 2013-10-21: qty 9

## 2013-10-21 NOTE — Anesthesia Preprocedure Evaluation (Addendum)
Anesthesia Evaluation  Patient identified by MRN, date of birth, ID band Patient awake    Reviewed: Allergy & Precautions, H&P , NPO status , Patient's Chart, lab work & pertinent test results  Airway Mallampati: II TM Distance: >3 FB Neck ROM: full    Dental no notable dental hx. (+) Teeth Intact and Dental Advisory Given   Pulmonary asthma ,  Severe asthma breath sounds clear to auscultation  Pulmonary exam normal       Cardiovascular Exercise Tolerance: Good negative cardio ROS  Rhythm:regular Rate:Normal     Neuro/Psych  Headaches, Anxiety Depression Brain tumor.  Blood clots in brain negative psych ROS   GI/Hepatic negative GI ROS, Neg liver ROS, GERD-  Medicated and Controlled,  Endo/Other  negative endocrine ROSHypothyroidism   Renal/GU negative Renal ROS  negative genitourinary   Musculoskeletal   Abdominal   Peds  Hematology negative hematology ROS (+)   Anesthesia Other Findings   Reproductive/Obstetrics negative OB ROS                          Anesthesia Physical Anesthesia Plan  ASA: III  Anesthesia Plan: General   Post-op Pain Management:    Induction: Intravenous  Airway Management Planned: Oral ETT  Additional Equipment:   Intra-op Plan:   Post-operative Plan: Extubation in OR  Informed Consent: I have reviewed the patients History and Physical, chart, labs and discussed the procedure including the risks, benefits and alternatives for the proposed anesthesia with the patient or authorized representative who has indicated his/her understanding and acceptance.   Dental Advisory Given  Plan Discussed with: CRNA and Surgeon  Anesthesia Plan Comments:         Anesthesia Quick Evaluation

## 2013-10-22 ENCOUNTER — Encounter (HOSPITAL_COMMUNITY)
Admission: RE | Disposition: A | Discharge status: Home / Self Care | Payer: Self-pay | Source: Ambulatory Visit | Attending: Urology

## 2013-10-22 ENCOUNTER — Inpatient Hospital Stay (HOSPITAL_COMMUNITY)
Admission: RE | Admit: 2013-10-22 | Discharge: 2013-10-23 | DRG: 708 | Disposition: A | Discharge status: Home / Self Care | Payer: 59 | Source: Ambulatory Visit | Attending: Urology | Admitting: Urology

## 2013-10-22 ENCOUNTER — Encounter (HOSPITAL_COMMUNITY): Payer: Self-pay | Admitting: *Deleted

## 2013-10-22 ENCOUNTER — Encounter (HOSPITAL_COMMUNITY): Payer: 59 | Admitting: Anesthesiology

## 2013-10-22 ENCOUNTER — Inpatient Hospital Stay (HOSPITAL_COMMUNITY): Payer: 59 | Admitting: Anesthesiology

## 2013-10-22 DIAGNOSIS — C61 Malignant neoplasm of prostate: Principal | ICD-10-CM | POA: Diagnosis present

## 2013-10-22 DIAGNOSIS — Z79899 Other long term (current) drug therapy: Secondary | ICD-10-CM

## 2013-10-22 DIAGNOSIS — K219 Gastro-esophageal reflux disease without esophagitis: Secondary | ICD-10-CM | POA: Diagnosis present

## 2013-10-22 DIAGNOSIS — F411 Generalized anxiety disorder: Secondary | ICD-10-CM | POA: Diagnosis present

## 2013-10-22 DIAGNOSIS — F329 Major depressive disorder, single episode, unspecified: Secondary | ICD-10-CM | POA: Diagnosis present

## 2013-10-22 DIAGNOSIS — E039 Hypothyroidism, unspecified: Secondary | ICD-10-CM | POA: Diagnosis present

## 2013-10-22 DIAGNOSIS — J45909 Unspecified asthma, uncomplicated: Secondary | ICD-10-CM | POA: Diagnosis present

## 2013-10-22 DIAGNOSIS — F3289 Other specified depressive episodes: Secondary | ICD-10-CM | POA: Diagnosis present

## 2013-10-22 DIAGNOSIS — E785 Hyperlipidemia, unspecified: Secondary | ICD-10-CM | POA: Diagnosis present

## 2013-10-22 HISTORY — PX: ROBOT ASSISTED LAPAROSCOPIC RADICAL PROSTATECTOMY: SHX5141

## 2013-10-22 HISTORY — PX: LYMPHADENECTOMY: SHX5960

## 2013-10-22 LAB — CBC
HCT: 36.9 % — ABNORMAL LOW (ref 39.0–52.0)
Hemoglobin: 13.1 g/dL (ref 13.0–17.0)
MCH: 30.7 pg (ref 26.0–34.0)
MCV: 86.4 fL (ref 78.0–100.0)
RBC: 4.27 MIL/uL (ref 4.22–5.81)
WBC: 8.2 10*3/uL (ref 4.0–10.5)

## 2013-10-22 LAB — BASIC METABOLIC PANEL
BUN: 9 mg/dL (ref 6–23)
CO2: 26 mEq/L (ref 19–32)
Calcium: 8.6 mg/dL (ref 8.4–10.5)
Chloride: 93 mEq/L — ABNORMAL LOW (ref 96–112)
Creatinine, Ser: 0.93 mg/dL (ref 0.50–1.35)
Glucose, Bld: 160 mg/dL — ABNORMAL HIGH (ref 70–99)
Sodium: 130 mEq/L — ABNORMAL LOW (ref 135–145)

## 2013-10-22 LAB — TYPE AND SCREEN: Antibody Screen: NEGATIVE

## 2013-10-22 SURGERY — ROBOTIC ASSISTED LAPAROSCOPIC RADICAL PROSTATECTOMY
Anesthesia: General | Site: Abdomen | Wound class: Clean Contaminated

## 2013-10-22 MED ORDER — LACTATED RINGERS IV SOLN
INTRAVENOUS | Status: DC | PRN
  Administered 2013-10-22 (×2): via INTRAVENOUS

## 2013-10-22 MED ORDER — SODIUM CHLORIDE 0.9 % IJ SOLN
INTRAMUSCULAR | Status: AC
  Filled 2013-10-22: qty 20

## 2013-10-22 MED ORDER — PANTOPRAZOLE SODIUM 40 MG PO TBEC
40.0000 mg | DELAYED_RELEASE_TABLET | Freq: Two times a day (BID) | ORAL | Status: DC
  Administered 2013-10-22 – 2013-10-23 (×2): 40 mg via ORAL
  Filled 2013-10-22 (×3): qty 1

## 2013-10-22 MED ORDER — MIDAZOLAM HCL 5 MG/5ML IJ SOLN
INTRAMUSCULAR | Status: DC | PRN
  Administered 2013-10-22: 2 mg via INTRAVENOUS

## 2013-10-22 MED ORDER — KCL IN DEXTROSE-NACL 20-5-0.9 MEQ/L-%-% IV SOLN
INTRAVENOUS | Status: DC
  Administered 2013-10-22 – 2013-10-23 (×2): via INTRAVENOUS
  Filled 2013-10-22 (×4): qty 1000

## 2013-10-22 MED ORDER — DEXAMETHASONE SODIUM PHOSPHATE 10 MG/ML IJ SOLN
INTRAMUSCULAR | Status: DC | PRN
  Administered 2013-10-22: 10 mg via INTRAVENOUS

## 2013-10-22 MED ORDER — MOMETASONE FURO-FORMOTEROL FUM 100-5 MCG/ACT IN AERO
2.0000 | INHALATION_SPRAY | Freq: Two times a day (BID) | RESPIRATORY_TRACT | Status: DC
  Administered 2013-10-22 – 2013-10-23 (×2): 2 via RESPIRATORY_TRACT
  Filled 2013-10-22: qty 8.8

## 2013-10-22 MED ORDER — BUPIVACAINE LIPOSOME 1.3 % IJ SUSP
INTRAMUSCULAR | Status: DC | PRN
  Administered 2013-10-22: 20 mL

## 2013-10-22 MED ORDER — SULFAMETHOXAZOLE-TMP DS 800-160 MG PO TABS
1.0000 | ORAL_TABLET | Freq: Two times a day (BID) | ORAL | Status: DC
  Administered 2013-10-22 – 2013-10-23 (×2): 1 via ORAL
  Filled 2013-10-22 (×3): qty 1

## 2013-10-22 MED ORDER — FENTANYL CITRATE 0.05 MG/ML IJ SOLN
INTRAMUSCULAR | Status: DC | PRN
  Administered 2013-10-22 (×2): 100 ug via INTRAVENOUS
  Administered 2013-10-22: 50 ug via INTRAVENOUS

## 2013-10-22 MED ORDER — ACETAMINOPHEN 500 MG PO TABS
1000.0000 mg | ORAL_TABLET | Freq: Four times a day (QID) | ORAL | Status: AC
  Administered 2013-10-22 – 2013-10-23 (×3): 1000 mg via ORAL
  Filled 2013-10-22 (×3): qty 2

## 2013-10-22 MED ORDER — OXYCODONE HCL 5 MG PO TABS: 5.0000 mg | ORAL_TABLET | ORAL | Status: DC | PRN

## 2013-10-22 MED ORDER — POTASSIUM CHLORIDE CRYS ER 20 MEQ PO TBCR
20.0000 meq | EXTENDED_RELEASE_TABLET | Freq: Once | ORAL | Status: AC
  Administered 2013-10-22: 20 meq via ORAL
  Filled 2013-10-22 (×2): qty 1

## 2013-10-22 MED ORDER — VANCOMYCIN HCL IN DEXTROSE 1-5 GM/200ML-% IV SOLN
INTRAVENOUS | Status: AC
  Filled 2013-10-22: qty 200

## 2013-10-22 MED ORDER — ALBUTEROL SULFATE HFA 108 (90 BASE) MCG/ACT IN AERS
INHALATION_SPRAY | RESPIRATORY_TRACT | Status: DC | PRN
  Administered 2013-10-22: 5 via RESPIRATORY_TRACT

## 2013-10-22 MED ORDER — PROPOFOL 10 MG/ML IV BOLUS
INTRAVENOUS | Status: DC | PRN
  Administered 2013-10-22: 170 mg via INTRAVENOUS

## 2013-10-22 MED ORDER — BUPIVACAINE LIPOSOME 1.3 % IJ SUSP
20.0000 mL | Freq: Once | INTRAMUSCULAR | Status: DC
  Filled 2013-10-22: qty 20

## 2013-10-22 MED ORDER — LACTATED RINGERS IV SOLN: INTRAVENOUS | Status: DC

## 2013-10-22 MED ORDER — LIDOCAINE HCL (CARDIAC) 20 MG/ML IV SOLN
INTRAVENOUS | Status: DC | PRN
  Administered 2013-10-22: 80 mg via INTRAVENOUS

## 2013-10-22 MED ORDER — VANCOMYCIN HCL IN DEXTROSE 1-5 GM/200ML-% IV SOLN
1000.0000 mg | INTRAVENOUS | Status: AC
  Administered 2013-10-22: 1000 mg via INTRAVENOUS

## 2013-10-22 MED ORDER — HYDROMORPHONE HCL PF 1 MG/ML IJ SOLN: 0.2500 mg | INTRAMUSCULAR | Status: DC | PRN

## 2013-10-22 MED ORDER — HEPARIN SODIUM (PORCINE) 5000 UNIT/ML IJ SOLN
5000.0000 [IU] | Freq: Three times a day (TID) | INTRAMUSCULAR | Status: DC
  Administered 2013-10-22 – 2013-10-23 (×3): 5000 [IU] via SUBCUTANEOUS
  Filled 2013-10-22 (×5): qty 1

## 2013-10-22 MED ORDER — ONDANSETRON HCL 4 MG/2ML IJ SOLN
4.0000 mg | INTRAMUSCULAR | Status: DC | PRN
  Administered 2013-10-22: 4 mg via INTRAVENOUS
  Filled 2013-10-22: qty 2

## 2013-10-22 MED ORDER — DOCUSATE SODIUM 100 MG PO CAPS
100.0000 mg | ORAL_CAPSULE | Freq: Two times a day (BID) | ORAL | Status: DC
  Administered 2013-10-22 – 2013-10-23 (×2): 100 mg via ORAL
  Filled 2013-10-22 (×3): qty 1

## 2013-10-22 MED ORDER — METAXALONE 800 MG PO TABS
800.0000 mg | ORAL_TABLET | Freq: Three times a day (TID) | ORAL | Status: DC
  Administered 2013-10-22 – 2013-10-23 (×3): 800 mg via ORAL
  Filled 2013-10-22 (×5): qty 1

## 2013-10-22 MED ORDER — SENNA 8.6 MG PO TABS
1.0000 | ORAL_TABLET | Freq: Two times a day (BID) | ORAL | Status: DC
  Administered 2013-10-22 – 2013-10-23 (×2): 8.6 mg via ORAL
  Filled 2013-10-22 (×2): qty 1

## 2013-10-22 MED ORDER — SUCCINYLCHOLINE CHLORIDE 20 MG/ML IJ SOLN
INTRAMUSCULAR | Status: DC | PRN
  Administered 2013-10-22: 100 mg via INTRAVENOUS

## 2013-10-22 MED ORDER — GABAPENTIN 300 MG PO CAPS
300.0000 mg | ORAL_CAPSULE | Freq: Three times a day (TID) | ORAL | Status: DC
  Administered 2013-10-22 – 2013-10-23 (×4): 300 mg via ORAL
  Filled 2013-10-22 (×5): qty 1

## 2013-10-22 MED ORDER — HYDROMORPHONE HCL PF 1 MG/ML IJ SOLN
INTRAMUSCULAR | Status: DC | PRN
  Administered 2013-10-22 (×2): 0.5 mg via INTRAVENOUS

## 2013-10-22 MED ORDER — LEVOTHYROXINE SODIUM 75 MCG PO TABS
75.0000 ug | ORAL_TABLET | Freq: Every day | ORAL | Status: DC
  Administered 2013-10-23: 08:00:00 75 ug via ORAL
  Filled 2013-10-22 (×2): qty 1

## 2013-10-22 MED ORDER — HYDROMORPHONE HCL PF 1 MG/ML IJ SOLN: 0.5000 mg | INTRAMUSCULAR | Status: DC | PRN

## 2013-10-22 MED ORDER — EZETIMIBE-SIMVASTATIN 10-40 MG PO TABS
1.0000 | ORAL_TABLET | Freq: Every day | ORAL | Status: DC
  Administered 2013-10-22: 1 via ORAL
  Filled 2013-10-22 (×2): qty 1

## 2013-10-22 MED ORDER — EPHEDRINE SULFATE 50 MG/ML IJ SOLN
INTRAMUSCULAR | Status: DC | PRN
  Administered 2013-10-22: 10 mg via INTRAVENOUS
  Administered 2013-10-22: 15 mg via INTRAVENOUS
  Administered 2013-10-22: 10 mg via INTRAVENOUS
  Administered 2013-10-22: 5 mg via INTRAVENOUS

## 2013-10-22 MED ORDER — GLYCOPYRROLATE 0.2 MG/ML IJ SOLN
INTRAMUSCULAR | Status: DC | PRN
  Administered 2013-10-22: .8 mg via INTRAVENOUS
  Administered 2013-10-22: 0.2 mg via INTRAVENOUS

## 2013-10-22 MED ORDER — NEOSTIGMINE METHYLSULFATE 1 MG/ML IJ SOLN
INTRAMUSCULAR | Status: DC | PRN
  Administered 2013-10-22: 5 mg via INTRAVENOUS

## 2013-10-22 MED ORDER — KETAMINE HCL 10 MG/ML IJ SOLN
INTRAMUSCULAR | Status: DC | PRN
  Administered 2013-10-22: 20 mg via INTRAVENOUS

## 2013-10-22 MED ORDER — LACTATED RINGERS IR SOLN
Status: DC | PRN
  Administered 2013-10-22: 1000 mL

## 2013-10-22 MED ORDER — ALBUTEROL SULFATE HFA 108 (90 BASE) MCG/ACT IN AERS
2.0000 | INHALATION_SPRAY | Freq: Four times a day (QID) | RESPIRATORY_TRACT | Status: DC | PRN
  Filled 2013-10-22: qty 6.7

## 2013-10-22 MED ORDER — ROCURONIUM BROMIDE 100 MG/10ML IV SOLN
INTRAVENOUS | Status: DC | PRN
  Administered 2013-10-22: 5 mg via INTRAVENOUS
  Administered 2013-10-22: 60 mg via INTRAVENOUS

## 2013-10-22 MED ORDER — ONDANSETRON HCL 4 MG/2ML IJ SOLN
INTRAMUSCULAR | Status: DC | PRN
  Administered 2013-10-22: 4 mg via INTRAVENOUS

## 2013-10-22 SURGICAL SUPPLY — 58 items
ADH SKN CLS APL DERMABOND .7 (GAUZE/BANDAGES/DRESSINGS) ×2
CABLE HIGH FREQUENCY MONO STRZ (ELECTRODE) ×1 IMPLANT
CANISTER SUCTION 2500CC (MISCELLANEOUS) ×3 IMPLANT
CATH FOLEY 2WAY SLVR 18FR 30CC (CATHETERS) ×3 IMPLANT
CATH TIEMANN FOLEY 18FR 5CC (CATHETERS) ×3 IMPLANT
CHLORAPREP W/TINT 26ML (MISCELLANEOUS) ×3 IMPLANT
CLIP LIGATING HEM O LOK PURPLE (MISCELLANEOUS) ×6 IMPLANT
CLIP LIGATING HEMO LOK XL GOLD (MISCELLANEOUS) ×3 IMPLANT
CLOTH BEACON ORANGE TIMEOUT ST (SAFETY) ×3 IMPLANT
CONT SPECI 4OZ STER CLIK (MISCELLANEOUS) ×3 IMPLANT
CORD HIGH FREQUENCY UNIPOLAR (ELECTROSURGICAL) ×2 IMPLANT
COVER SURGICAL LIGHT HANDLE (MISCELLANEOUS) ×3 IMPLANT
COVER TIP SHEARS 8 DVNC (MISCELLANEOUS) ×2 IMPLANT
COVER TIP SHEARS 8MM DA VINCI (MISCELLANEOUS) ×1
CUTTER ECHEON FLEX ENDO 45 340 (ENDOMECHANICALS) ×3 IMPLANT
DECANTER SPIKE VIAL GLASS SM (MISCELLANEOUS) ×3 IMPLANT
DERMABOND ADVANCED (GAUZE/BANDAGES/DRESSINGS) ×1
DERMABOND ADVANCED .7 DNX12 (GAUZE/BANDAGES/DRESSINGS) ×2 IMPLANT
DISSECT BALLN SPACEMKR + OVL (BALLOONS) ×3
DISSECTOR BALLN SPACEMKR + OVL (BALLOONS) IMPLANT
DRAPE CAMERA CLOSED 9X96 (DRAPES) ×1 IMPLANT
DRAPE SURG IRRIG POUCH 19X23 (DRAPES) ×3 IMPLANT
DRSG TEGADERM 2-3/8X2-3/4 SM (GAUZE/BANDAGES/DRESSINGS) ×12 IMPLANT
DRSG TEGADERM 4X4.75 (GAUZE/BANDAGES/DRESSINGS) ×6 IMPLANT
DRSG TEGADERM 6X8 (GAUZE/BANDAGES/DRESSINGS) ×6 IMPLANT
ELECT REM PT RETURN 9FT ADLT (ELECTROSURGICAL) ×3
ELECTRODE REM PT RTRN 9FT ADLT (ELECTROSURGICAL) ×2 IMPLANT
GAUZE SPONGE 2X2 8PLY STRL LF (GAUZE/BANDAGES/DRESSINGS) ×2 IMPLANT
GLOVE BIO SURGEON STRL SZ 6.5 (GLOVE) ×3 IMPLANT
GLOVE BIOGEL M STRL SZ7.5 (GLOVE) ×9 IMPLANT
GOWN PREVENTION PLUS LG XLONG (DISPOSABLE) ×6 IMPLANT
GOWN STRL REIN XL XLG (GOWN DISPOSABLE) ×6 IMPLANT
HOLDER FOLEY CATH W/STRAP (MISCELLANEOUS) ×3 IMPLANT
IV LACTATED RINGERS 1000ML (IV SOLUTION) ×3 IMPLANT
KIT ACCESSORY DA VINCI DISP (KITS) ×1
KIT ACCESSORY DVNC DISP (KITS) ×2 IMPLANT
KIT PROCEDURE DA VINCI SI (MISCELLANEOUS) ×1
KIT PROCEDURE DVNC SI (MISCELLANEOUS) ×2 IMPLANT
NDL INSUFFLATION 14GA 120MM (NEEDLE) ×1 IMPLANT
NEEDLE INSUFFLATION 14GA 120MM (NEEDLE) ×3 IMPLANT
NEEDLE SPNL 22GX7 SPINOC (NEEDLE) ×3 IMPLANT
PACK ROBOT UROLOGY CUSTOM (CUSTOM PROCEDURE TRAY) ×3 IMPLANT
RELOAD GREEN ECHELON 45 (STAPLE) ×3 IMPLANT
SCISSORS LAP 5X45 EPIX DISP (ENDOMECHANICALS) ×2 IMPLANT
SET TUBE IRRIG SUCTION NO TIP (IRRIGATION / IRRIGATOR) ×3 IMPLANT
SOLUTION ELECTROLUBE (MISCELLANEOUS) ×3 IMPLANT
SPONGE GAUZE 2X2 STER 10/PKG (GAUZE/BANDAGES/DRESSINGS) ×1
SPONGE LAP 4X18 X RAY DECT (DISPOSABLE) ×3 IMPLANT
SUT ETHILON 3 0 PS 1 (SUTURE) ×3 IMPLANT
SUT MNCRL AB 4-0 PS2 18 (SUTURE) ×6 IMPLANT
SUT PDS AB 1 CT1 27 (SUTURE) ×6 IMPLANT
SUT VICRYL 0 UR6 27IN ABS (SUTURE) ×3 IMPLANT
SUT VLOC BARB 180 ABS3/0GR12 (SUTURE) ×9
SUTURE VLOC BRB 180 ABS3/0GR12 (SUTURE) IMPLANT
SYR 27GX1/2 1ML LL SAFETY (SYRINGE) ×3 IMPLANT
TOWEL OR NON WOVEN STRL DISP B (DISPOSABLE) ×3 IMPLANT
TROCAR 12M 150ML BLUNT (TROCAR) ×3 IMPLANT
WATER STERILE IRR 1500ML POUR (IV SOLUTION) ×6 IMPLANT

## 2013-10-22 NOTE — H&P (Signed)
Adam Sullivan is an 57 y.o. male.    Chief Complaint: PRe-OP Robotic Prostatectomy  HPI:   1 -  Prostate Cancer - Pt wtih 3 cores Gleason 6 (up to 60%) by prostate biopsy 06/2013 on w/y PSA 4.2. Pt's father with prostate cancer s/p brachy. We have previously discussed managment opitons in detail and he obtained very informative opinion form Adam Sullivan with radiation oncology as well.   PMH sig for hypothyroid, resection of benign brain tumor and VP shunt in 1990s. No CV disesease. No blood thinners.   Today Adam Sullivan is seen to proceed with robotic prostatectomy.   Past Medical History  Diagnosis Date  . Allergy   . Hyperlipidemia   . Brain tumor 1993  . Prostate cancer 07/01/13    gleason 6  . Thyroid disease     hypothyroidism  . Orchalgia   . Anxiety   . Depression     situational  . Asthma     severe, Dr. Alwyn Sullivan  . GERD (gastroesophageal reflux disease)     severe  . Headache(784.0)   . Blood clots in brain     history of   . Pneumonia     hx of    Past Surgical History  Procedure Laterality Date  . Removal brain tumor  1993    pineal tumor, benign  . Brain shunt  1993    put in, removed, put in again  . Removed blood clot      x2    Family History  Problem Relation Age of Onset  . Asthma Maternal Uncle   . Cancer Father     prostate  cancer  . Testicular cancer Father   . Coronary artery disease Mother 24    5 vessel CABG  . Colon cancer Neg Hx   . Stomach cancer Neg Hx    Social History:  reports that he has never smoked. He has never used smokeless tobacco. He reports that he drinks alcohol. He reports that he does not use illicit drugs.  Allergies:  Allergies  Allergen Reactions  . Chocolate   . Cola   . Dust Mite Extract   . Erythromycin Other (See Comments)    Pt unsure of what the reaction was. Happen as a child.     Medications Prior to Admission  Medication Sig Dispense Refill  . albuterol (PROVENTIL HFA;VENTOLIN HFA) 108 (90 BASE) MCG/ACT  inhaler Inhale 2 puffs into the lungs every 6 (six) hours as needed for wheezing.      Marland Kitchen etodolac (LODINE) 400 MG tablet Take 400 mg by mouth daily as needed (pain). Dr.Freeman      . ezetimibe-simvastatin (VYTORIN) 10-40 MG per tablet Take 1 tablet by mouth at bedtime.        . Fluticasone-Salmeterol (ADVAIR) 250-50 MCG/DOSE AEPB Inhale 1 puff into the lungs every 12 (twelve) hours.      . gabapentin (NEURONTIN) 300 MG capsule Take 300 mg by mouth 3 (three) times daily.        Marland Kitchen levothyroxine (SYNTHROID, LEVOTHROID) 75 MCG tablet Take 75 mcg by mouth daily before breakfast.       . metaxalone (SKELAXIN) 800 MG tablet Take 800 mg by mouth 3 (three) times daily as needed for pain.       . montelukast (SINGULAIR) 10 MG tablet Take 10 mg by mouth at bedtime.      . pantoprazole (PROTONIX) 40 MG tablet Take 40 mg by mouth 2 (two) times daily.        Marland Kitchen  sulfamethoxazole-trimethoprim (BACTRIM DS) 800-160 MG per tablet Take 1 tablet by mouth 2 (two) times daily.        Results for orders placed during the hospital encounter of 10/20/13 (from the past 48 hour(s))  CBC     Status: Abnormal   Collection Time    10/20/13  9:40 AM      Result Value Range   WBC 5.3  4.0 - 10.5 K/uL   RBC 4.37  4.22 - 5.81 MIL/uL   Hemoglobin 12.9 (*) 13.0 - 17.0 g/dL   HCT 65.7 (*) 84.6 - 96.2 %   MCV 87.0  78.0 - 100.0 fL   MCH 29.5  26.0 - 34.0 pg   MCHC 33.9  30.0 - 36.0 g/dL   RDW 95.2  84.1 - 32.4 %   Platelets 256  150 - 400 K/uL  BASIC METABOLIC PANEL     Status: Abnormal   Collection Time    10/20/13  9:40 AM      Result Value Range   Sodium 132 (*) 135 - 145 mEq/L   Potassium 4.3  3.5 - 5.1 mEq/L   Chloride 96  96 - 112 mEq/L   CO2 27  19 - 32 mEq/L   Glucose, Bld 94  70 - 99 mg/dL   BUN 13  6 - 23 mg/dL   Creatinine, Ser 4.01  0.50 - 1.35 mg/dL   Calcium 9.5  8.4 - 02.7 mg/dL   GFR calc non Af Amer >90  >90 mL/min   GFR calc Af Amer >90  >90 mL/min   Comment: (NOTE)     The eGFR has been  calculated using the CKD EPI equation.     This calculation has not been validated in all clinical situations.     eGFR's persistently <90 mL/min signify possible Chronic Kidney     Disease.  TYPE AND SCREEN     Status: None   Collection Time    10/20/13  9:40 AM      Result Value Range   ABO/RH(D) O POS     Antibody Screen NEG     Sample Expiration 11/03/2013    ABO/RH     Status: None   Collection Time    10/20/13  9:40 AM      Result Value Range   ABO/RH(D) O POS     Dg Chest 2 View  10/20/2013   CLINICAL DATA:  Preop for prostatectomy 03/13/2006  EXAM: CHEST  2 VIEW  COMPARISON:  03/13/2006  FINDINGS: Cardiomediastinal silhouette is stable. No acute infiltrate or pleural effusion. No pulmonary edema. Mild degenerative changes thoracic spine. Stable right chest wall artifact probable previous shunt catheter.  IMPRESSION: No active cardiopulmonary disease.   Electronically Signed   By: Adam Sullivan M.D.   On: 10/20/2013 11:00    Review of Systems  Constitutional: Negative.  Negative for fever and chills.  HENT: Negative.   Eyes: Negative.   Respiratory: Negative.   Cardiovascular: Negative.   Gastrointestinal: Negative.   Genitourinary: Negative.   Musculoskeletal: Negative.   Skin: Negative.   Neurological: Negative.   Endo/Heme/Allergies: Negative.   Psychiatric/Behavioral: Negative.     Blood pressure 149/92, pulse 71, temperature 97.5 F (36.4 C), temperature source Oral, resp. rate 18, SpO2 100.00%. Physical Exam  Constitutional: He is oriented to person, place, and time. He appears well-developed and well-nourished.  HENT:  Head: Normocephalic and atraumatic.  Eyes: EOM are normal. Pupils are equal, round, and reactive to light.  Neck: Normal range of motion. Neck supple.  Cardiovascular: Normal rate.   Respiratory: Effort normal.  GI: Soft. Bowel sounds are normal.  Genitourinary: Penis normal.  Musculoskeletal: Normal range of motion.  Neurological: He is  alert and oriented to person, place, and time.  Skin: Skin is warm and dry.  Psychiatric: He has a normal mood and affect. His behavior is normal. Judgment and thought content normal.     Assessment/Plan  1 - Prostate Cancer -  We rediscussed the diagnosis of prostate cancer and managment options including the inciteful points raised by Dr. Dayton Scrape. He wants to proceed with surgery.   We rediscussed prostatectomy and specifically robotic prostatectomy with bilateral pelvic lymphadenectomy being the technique that I most commonly perform. I showed the patient on their abdomen the approximately 6 small incision (trocar) sites as well as presumed extraction sites with robotic approach as well as possible open incision sites should open conversion be necessary. We rediscussed peri-operative risks including bleeding, infection, deep vein thrombosis, pulmonary embolism, compartment syndrome, nuropathy / neuropraxia, heart attack, stroke, death, as well as long-term risks such as non-cure / need for additional therapy. We specifically readdressed that the procedure would compromise urinary control leading to stress incontinence which typically resolves with time and pelvic rehabilitation (Kegel's, etc..), but can sometimes be permanent and require additional therapy including surgery. We also specifically readdressed sexual sequellae including significant erectile dysfunction which typically partially resolves with time but can also be permanent and require additional therapy including surgery.   We rediscussed the typical hospital course including usual 1-2 night hospitalization, discharge with foley catheter in place usually for 1-2 weeks before voiding trial as well as usually 2 week recovery until able to perform most non-strenuous activity and 6 weeks until able to return to most jobs and more strenuous activity such as exercise.   Given his h/o VP shunt. He started Bactrim 3 days prior, and will have Vanc  + Gent peri-op prophylaxis. Additionally I will attempt pre-peritoneal approach if possible. He understands he will be at higher risk of shunt infection perioperatively regardless of approach.   Sir Mallis 10/22/2013, 5:59 AM

## 2013-10-22 NOTE — Preoperative (Signed)
Beta Blockers   Reason not to administer Beta Blockers:Not Applicable 

## 2013-10-22 NOTE — Brief Op Note (Signed)
10/22/2013  11:11 AM  PATIENT:  Adam Sullivan  57 y.o. male  PRE-OPERATIVE DIAGNOSIS:  PROSTATE CANCER  POST-OPERATIVE DIAGNOSIS:  PROSTATE CANCER  PROCEDURE:  Procedure(s) with comments: ROBOTIC ASSISTED LAPAROSCOPIC RADICAL PROSTATECTOMY   PRE- PERITONEAL APPROACH (N/A) - 3.5 HRS  LYMPHADENECTOMY (Bilateral)  SURGEON:  Surgeon(s) and Role:    * Sebastian Ache, MD - Primary    * Crist Fat, MD - Assisting  PHYSICIAN ASSISTANT:   ASSISTANTS: Cordella Register MD  ANESTHESIA:   local and general  EBL:  Total I/O In: -  Out: 150 [Blood:150]  BLOOD ADMINISTERED:none  DRAINS: JP in RLQ to bulb suction   LOCAL MEDICATIONS USED:  MARCAINE     SPECIMEN:  Source of Specimen:  1 - Rt obturator nodes, 2 - Lt obturator nodes, 3 - prostatectomy  DISPOSITION OF SPECIMEN:  PATHOLOGY  COUNTS:  YES  TOURNIQUET:  * No tourniquets in log *  DICTATION: .Other Dictation: Dictation Number [id number]  PLAN OF CARE: Admit to inpatient   PATIENT DISPOSITION:  PACU - hemodynamically stable.   Delay start of Pharmacological VTE agent (>24hrs) due to surgical blood loss or risk of bleeding: yes

## 2013-10-22 NOTE — Transfer of Care (Signed)
Immediate Anesthesia Transfer of Care Note  Patient: Adam Sullivan  Procedure(s) Performed: Procedure(s) with comments: ROBOTIC ASSISTED LAPAROSCOPIC RADICAL PROSTATECTOMY   PRE- PERITONEAL APPROACH (N/A) - 3.5 HRS  LYMPHADENECTOMY (Bilateral)  Patient Location: PACU  Anesthesia Type:General  Level of Consciousness: sedated, patient cooperative and responds to stimulation  Airway & Oxygen Therapy: Patient connected to face mask  Post-op Assessment: Report given to PACU RN and Post -op Vital signs reviewed and stable  Post vital signs: Reviewed and stable  Complications: No apparent anesthesia complications

## 2013-10-22 NOTE — Progress Notes (Signed)
Pt has done well since arriving to unit, he has however been very sleepy. He does arouse and answer questions, awake for 2-4 minutes and then back to sleep. Wife at bedside.

## 2013-10-22 NOTE — Anesthesia Postprocedure Evaluation (Signed)
  Anesthesia Post-op Note  Patient: Adam Sullivan  Procedure(s) Performed: Procedure(s) (LRB): ROBOTIC ASSISTED LAPAROSCOPIC RADICAL PROSTATECTOMY   PRE- PERITONEAL APPROACH (N/A) LYMPHADENECTOMY (Bilateral)  Patient Location: PACU  Anesthesia Type: General  Level of Consciousness: awake and alert   Airway and Oxygen Therapy: Patient Spontanous Breathing  Post-op Pain: mild  Post-op Assessment: Post-op Vital signs reviewed, Patient's Cardiovascular Status Stable, Respiratory Function Stable, Patent Airway and No signs of Nausea or vomiting  Last Vitals:  Filed Vitals:   10/22/13 1230  BP: 136/80  Pulse: 66  Temp: 36.4 C  Resp: 12    Post-op Vital Signs: stable   Complications: No apparent anesthesia complications

## 2013-10-23 LAB — BASIC METABOLIC PANEL
BUN: 9 mg/dL (ref 6–23)
CO2: 24 mEq/L (ref 19–32)
Calcium: 8.2 mg/dL — ABNORMAL LOW (ref 8.4–10.5)
Chloride: 97 mEq/L (ref 96–112)
Creatinine, Ser: 0.73 mg/dL (ref 0.50–1.35)
GFR calc non Af Amer: >90 mL/min (ref >90)
Glucose, Bld: 152 mg/dL — ABNORMAL HIGH (ref 70–99)
Potassium: 4.2 mEq/L (ref 3.5–5.1)
Sodium: 130 mEq/L — ABNORMAL LOW (ref 135–145)

## 2013-10-23 MED ORDER — SULFAMETHOXAZOLE-TMP DS 800-160 MG PO TABS: 1.0000 | ORAL_TABLET | Freq: Two times a day (BID) | ORAL | Status: DC

## 2013-10-23 MED ORDER — SENNOSIDES-DOCUSATE SODIUM 8.6-50 MG PO TABS: 1.0000 | ORAL_TABLET | Freq: Two times a day (BID) | ORAL | Status: DC

## 2013-10-23 MED ORDER — OXYCODONE-ACETAMINOPHEN 5-325 MG PO TABS: 1.0000 | ORAL_TABLET | ORAL | Status: DC | PRN

## 2013-10-23 NOTE — Discharge Summary (Signed)
Physician Discharge Summary  Patient ID: Adam Sullivan MRN: [id number] DOB/AGE: January 29, 1956 57 y.o.  Admit date: 10/22/2013 Discharge date: 10/23/2013  Admission Diagnoses: Low-Risk Prostate Cancer  Discharge Diagnoses: Low-Risk Prostate Cancer Active Problems:   * No active hospital problems. *   Discharged Condition: good  Hospital Course:   1 - Low-Risk Prostate Cancer - pt underwent robotic radical prostatectomy with bilateral pelvic lymphadenectomy on the day of admission without acute complications using pre-peritoneal technique. He was admitted the the 4th floor Urology service post-op. By POD1, the day of discharge, he was ambulatory, pain controlled, making excellent urine via foley with minimal JP output, tollerating regular diet, and felt to be adequate for discharge.   Consults: None  Significant Diagnostic Studies: labs: pathology - pending  Treatments: surgery:  robotic radical prostatectomy with bilateral pelvic lymphadenectomy  10/22/2013  Discharge Exam: Blood pressure 143/68, pulse 75, temperature 98 F (36.7 C), temperature source Oral, resp. rate 18, height 5\' 6"  (1.676 m), weight 82.3 kg (181 lb 7 oz), SpO2 100.00%. General appearance: alert, cooperative, appears stated age and family at bedside Head: Normocephalic, without obvious abnormality, atraumatic Eyes: conjunctivae/corneas clear. PERRL, EOM's intact. Fundi benign. Ears: normal TM's and external ear canals both ears Nose: Nares normal. Septum midline. Mucosa normal. No drainage or sinus tenderness. Throat: lips, mucosa, and tongue normal; teeth and gums normal Neck: no adenopathy, no carotid bruit, no JVD, supple, symmetrical, trachea midline and thyroid not enlarged, symmetric, no tenderness/mass/nodules Back: symmetric, no curvature. ROM normal. No CVA tenderness. Resp: clear to auscultation bilaterally Chest wall: no tenderness Cardio: regular rate and rhythm, S1, S2 normal, no murmur, click, rub or  gallop GI: soft, non-tender; bowel sounds normal; no masses,  no organomegaly Male genitalia: normal, foley c/d/i with clear urine.  Extremities: extremities normal, atraumatic, no cyanosis or edema Pulses: 2+ and symmetric Skin: Skin color, texture, turgor normal. No rashes or lesions Lymph nodes: Cervical, supraclavicular, and axillary nodes normal. Neurologic: Grossly normal Incision/Wound: Incision sites all c/d/i. JP removed and dry dressing applied.   Disposition: Final discharge disposition not confirmed     Medication List         albuterol 108 (90 BASE) MCG/ACT inhaler  Commonly known as:  PROVENTIL HFA;VENTOLIN HFA  Inhale 2 puffs into the lungs every 6 (six) hours as needed for wheezing.     etodolac 400 MG tablet  Commonly known as:  LODINE  Take 400 mg by mouth daily as needed (pain). Dr.Freeman     ezetimibe-simvastatin 10-40 MG per tablet  Commonly known as:  VYTORIN  Take 1 tablet by mouth at bedtime.     Fluticasone-Salmeterol 250-50 MCG/DOSE Aepb  Commonly known as:  ADVAIR  Inhale 1 puff into the lungs every 12 (twelve) hours.     gabapentin 300 MG capsule  Commonly known as:  NEURONTIN  Take 300 mg by mouth 3 (three) times daily.     levothyroxine 75 MCG tablet  Commonly known as:  SYNTHROID, LEVOTHROID  Take 75 mcg by mouth daily before breakfast.     metaxalone 800 MG tablet  Commonly known as:  SKELAXIN  Take 800 mg by mouth 3 (three) times daily as needed for pain.     montelukast 10 MG tablet  Commonly known as:  SINGULAIR  Take 10 mg by mouth at bedtime.     oxyCODONE-acetaminophen 5-325 MG per tablet  Commonly known as:  ROXICET  Take 1 tablet by mouth every 4 (four) hours as needed for  pain. Post-operatively     pantoprazole 40 MG tablet  Commonly known as:  PROTONIX  Take 40 mg by mouth 2 (two) times daily.     senna-docusate 8.6-50 MG per tablet  Commonly known as:  Senokot-S  Take 1 tablet by mouth 2 (two) times daily. While  taking pain meds to prevent constipation.     sulfamethoxazole-trimethoprim 800-160 MG per tablet  Commonly known as:  BACTRIM DS  Take 1 tablet by mouth 2 (two) times daily. X 7 days to prevent infection     sulfamethoxazole-trimethoprim 800-160 MG per tablet  Commonly known as:  BACTRIM DS  Take 1 tablet by mouth 2 (two) times daily.           Follow-up Information   Follow up with Sebastian Ache, MD On 11/01/2013. (at 9 AM for MD visit and catheter removal)    Specialty:  Urology   Contact information:   [address] [place] [address] [phone]       Signed: Sebastian Ache 10/23/2013, 4:56 PM

## 2013-10-23 NOTE — Progress Notes (Signed)
Pt aaox3.  Pt sit on side of bed without difficulty.  Pt ambulated in hall without difficulty

## 2013-10-23 NOTE — Progress Notes (Signed)
Given large foley bag and 2 leg bags, instructed how to use both.

## 2013-10-23 NOTE — Op Note (Signed)
NAME:  Adam Sullivan, Adam Sullivan NO.:  [id number]  MEDICAL RECORD NO.:  [id number]  LOCATION:  1442                         FACILITY:  [facility]  PHYSICIAN:  Sebastian Ache, MD     DATE OF BIRTH:  08-10-1956  DATE OF PROCEDURE:  10/22/2013 DATE OF DISCHARGE:                              OPERATIVE REPORT   PREOPERATIVE DIAGNOSIS:  Low-risk prostate cancer.  PROCEDURE: 1. Robotic-assisted laparoscopic prostatectomy, preperitoneal     approach. 2. Bilateral limited pelvic lymphadenectomy.  ASSISTANT:  Berniece Salines, MD  DRAINS:  Jackson-Pratt drain to bulb suction.  SPECIMENS: 1. Right obturator lymph nodes. 2. Left obturator lymph nodes. 3. Periprostatic fat. 4. Prostatectomy.  ESTIMATED BLOOD LOSS:  150 mL.  COMPLICATIONS:  None.  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  Options were discussed for management including active surveillance versus ablative therapies versus surgical extirpation and he had talked with several providers including radiation oncologist and decided to proceed with surgery.  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  We discussed using a preperitoneal technique to reduce risks of contamination of the shunt.  He wished to proceed.  Informed consent was obtained and placed in medical record.  PROCEDURE IN DETAIL:  The patient is being Adam Sullivan and procedure being robotic prostatectomy was confirmed.  Procedure was carried out. Time-out was performed.  Intravenous antibiotics were administered. General endotracheal anesthesia was introduced.  The patient placed in a low lithotomy position.  Sterile field was created by prepping and draping the patient's penis, perineum, and proximal thighs using iodine as well as infraxiphoid abdomen using chlorhexidine gluconate.  He was further passed on the operative  table by tucking his arms and placing segments of 3-inch tape across his chest.  He was placed in the steep Trendelenburg position and found to be suitably stable.  Foley catheter was placed per urethra to straight drain.  Next, 1.5 cm incision was made approximately 2 fingerbreadths below the area of the umbilicus. Incision was carried down to the fascia, which was incised coldly. Next, digital dissection was performed developing the space of Retzius followed by placement of the balloon dilation apparatus which was then inflated under laparoscopic guidance to 55 pumps.  This very well- developed space of Retzius and bilateral internal rings and pubic ramus could easily be seen.  This was held for 90 seconds and released.  Next, using digital guidance, additional ports were placed as follows; right paramedian 8-mm robotic port, right far lateral 12 mm assist port, left paramedian 8-mm robotic port, left far lateral 8-mm robotic port, and right paramedian 5 mm suction port.  The spacemaker camera port was then inserted with the balloon inflated to 30 mL, pneumopreperitoneum was achieved.  Robot was then docked and passed through drawing checks. Laparoscopic examination revealed excellent development of the preperitoneal space, as well as excellent placement of all ports within the preperitoneal space.  Attention was directed at pelvic lymphadenectomy, first in the right side, all of fibrofatty tissue confines the right external iliac vein pelvic side wall.  Obturator nerve  root were carefully mobilized, lymphostasis was achieved with cold clips.  This was set aside labeled with right pelvic lymphadenectomy. Mirror image pelvic lymphadenectomy was performed on the left side, taking great care to avoid injury to the obturator nerve, which did not occur on both sides.  This was inspected following lymphadenectomy.  Next, the endopelvic fascia was carefully swept away from the lateral edge  of the prostate bilaterally from the base to apex orientation. This exposed to the area of the dorsal venous complex, which was controlled with endovascular stapler.  Area of the bladder neck was identified by moving Foley catheter back and forth, periprostatic fat was removed from this area and set aside for permanent pathology.  The bladder neck was then incised in a anterior-posterior direction, thus separating it from the base of the prostate.  Posterior dissection was performed by entering a plane directly posterior to the inferior lip of the bladder neck and dissecting directly posterior.  Bilateral seminal vesicles were encountered, dissected to the tip with gentle superior traction.  Bilateral vas deferens were dissected for distance of 4 cm ligated and placed on gentle superior traction.  Posterior dissection was performed by developing the plane of Denonvilliers' from a base to apex orientation.  This exposed the bilateral prostatic pedicles.  Next, bilateral nerve sparing was performed, first on the right side by sweeping the presumed neurovascular pedicles away from the apex and lateral aspect of the prostate.  This further exposed the vascular pedicles, which had been controlled using sequential clipping.  Mirror image dissection was performed on the left side revealing left-sided nerve sparing, and cold control of the left vascular pedicles.  Next, anterior and apical dissection was performed by placing the prostate in gentle inferior-superior traction.  The membranous urethra was seen, caudally ligated leaving what appeared to be an adequate membranous urethral stump.  This completely freed up, the prostatectomy specimen was placed in EndoCatch bag for later retrieval.  Digital rectal exam was then performed by surgeon with left finger and no rectal violation was noted.  Next, posterior reconstruction was performed using a single V-Loc suture bringing the posterior  urethral plate into apposition to the posterior bladder neck.  Next, mucosa-to-mucosa anastomosis was performed using double-armed V- Loc suture from 6 o'clock to 12 o'clock position, by moving a Foley catheter back and forth.  This appeared to be watertight.  The catheter was irrigated and confirmed this at the 12 o'clock position.  Additional stitch was taken to the puboprostatic ligaments thus performing anterior reconstruction.  Following these maneuvers, hemostasis appeared excellent.  All sponge and needle counts were correct.  The far right lateral 12 mm port was closed using a Carter-Thomason suture passer and Vicryl.  Closed suction drain was brought through the previous left lateral most robotic port site into the area of the space of Retzius. Robot was then undocked.  Specimen was retrieved by extending camera port site inferiorly for a total distance of approximately 2 cm removing the prostatectomy specimen and setting aside for permanent pathology. This site was closed to the level of fascia using interrupted PDS x3 and closed the Scarpa's using running Vicryl.  All incisions were then infiltrated with dilute lipolyzed  Marcaine and closed the level of skin using subcuticular Monocryl followed by Dermabond.  Procedure was then terminated.  The patient tolerated the procedure well.  There were no immediate periprocedural complications.  The patient was taken to the postanesthesia care unit in stable condition.  ______________________________ Sebastian Ache, MD     TM/MEDQ  D:  10/22/2013  T:  10/22/2013  Job:  [id number]

## 2013-10-25 ENCOUNTER — Encounter (HOSPITAL_COMMUNITY): Payer: Self-pay | Admitting: Urology

## 2013-12-31 NOTE — Addendum Note (Signed)
Encounter addended by: Andria Rhein, RN on: 12/31/2013  8:35 AM<BR>     Documentation filed: Inpatient Document Flowsheet

## 2014-05-16 ENCOUNTER — Other Ambulatory Visit: Payer: Self-pay | Admitting: Internal Medicine

## 2014-05-19 ENCOUNTER — Encounter: Payer: Self-pay | Admitting: Internal Medicine

## 2014-05-19 ENCOUNTER — Ambulatory Visit (INDEPENDENT_AMBULATORY_CARE_PROVIDER_SITE_OTHER): Payer: 59 | Admitting: Internal Medicine

## 2014-05-19 VITALS — BP 122/94 | HR 70 | Temp 97.7°F | Resp 13 | Ht 66.0 in | Wt 181.8 lb

## 2014-05-19 DIAGNOSIS — R7309 Other abnormal glucose: Secondary | ICD-10-CM

## 2014-05-19 DIAGNOSIS — C61 Malignant neoplasm of prostate: Secondary | ICD-10-CM

## 2014-05-19 DIAGNOSIS — D62 Acute posthemorrhagic anemia: Secondary | ICD-10-CM

## 2014-05-19 DIAGNOSIS — J45909 Unspecified asthma, uncomplicated: Secondary | ICD-10-CM

## 2014-05-19 MED ORDER — MONTELUKAST SODIUM 10 MG PO TABS
10.0000 mg | ORAL_TABLET | Freq: Every day | ORAL | Status: DC
Start: 2014-05-19 — End: 2015-02-02

## 2014-05-19 MED ORDER — ALBUTEROL SULFATE HFA 108 (90 BASE) MCG/ACT IN AERS: 2.0000 | INHALATION_SPRAY | Freq: Four times a day (QID) | RESPIRATORY_TRACT | Status: DC | PRN

## 2014-05-19 MED ORDER — FLUTICASONE-SALMETEROL 250-50 MCG/DOSE IN AEPB: 1.0000 | INHALATION_SPRAY | Freq: Two times a day (BID) | RESPIRATORY_TRACT | Status: DC

## 2014-05-19 NOTE — Assessment & Plan Note (Signed)
Refill meds

## 2014-05-19 NOTE — Progress Notes (Signed)
Pre visit review using our clinic review tool, if applicable. No additional management support is needed unless otherwise documented below in the visit note. 

## 2014-05-19 NOTE — Progress Notes (Signed)
   Subjective:    Patient ID: Adam Sullivan, male    DOB: September 21, 1956, 58 y.o.   MRN: [id number]  HPI   He is here to refill his albuterol and Advair.  His asthma is well controlled. He typically uses his rescue inhaler less than once a week. At max he will use it 3 times a week.  He has been gargling after using his Advair.  He does have some exertional dyspnea but he does not exercise.  He is followed by Dr. Forde Dandy for his thyroid issues. He reaffirms his lipids are checked at that office also. He sees Dr. Domingo Cocking for his chronic headaches.  Review of the chart documents significant elevation of glucose on two occasions. On 09/27/13 his glucose was 152.  He was also documented to be anemic on 10/23/13 with hemoglobin of 11.9 hematocrit 34.1. Apparently this was in the context of prostate resection by Dr.Manny. He was also under the impression that he had a colonoscopy during that admission.     Review of Systems  He denies cough or sputum production.  He has no chest pain or palpitations.  He denies paroxysmal nocturnal dyspnea.     Objective:   Physical Exam  General appearance:good health ;well nourished; no acute distress or increased work of breathing is present.  No  lymphadenopathy about the head, neck, or axilla noted. Postsurgical cranial deficits.  Eyes: No conjunctival inflammation or lid edema is present. There is no scleral icterus.Ptosis left eye.  Ears:  External ear exam shows no significant lesions or deformities.  Otoscopic examination reveals clear canals, tympanic membranes are intact bilaterally without bulging, retraction, inflammation or discharge.  Nose:  External nasal examination shows no deformity or inflammation. Nasal mucosa are pink and moist without lesions or exudates. No septal dislocation or deviation.No obstruction to airflow.   Oral exam: Dental hygiene is good; lips and gums are healthy appearing.There is no oropharyngeal erythema or exudate  noted.   Neck:  No deformities, thyromegaly, masses, or tenderness noted.   Supple with full range of motion without pain.   Heart:  Normal rate and regular rhythm. S1 and S2 normal without gallop, murmur, click, rub or other extra sounds.   Lungs:Low grade scattered wheezes w/o rhonchi,rales ,or rubs .No increased work of breathing.    Extremities:  No cyanosis or clubbing  noted .  Trace edema at the sock line.   Skin: Warm & dry w/o jaundice or tenting.         Assessment & Plan:  #1 asthma, history suggests good control despite some wheezing today #2 elevated glucose; A1c declined . He'll F/U with Dr Forde Dandy #3 anemia, post op. Verification of colonoscopy status requested

## 2014-05-19 NOTE — Patient Instructions (Addendum)
Asthma involves both smooth muscle spasm as well as airway inflammation. The rescue inhaler albuterol will treat the muscle spasm but should not be used more than every 4 - 6 hours on an as needed basis. Excessive use can lead to cardiac irregularities which can be significant. Medications containing anti-inflammatory agents such as Symbicort, Dulera, Advair, or the inhaled steroids do treat the inflammatory component. The should be used at maximal doses during acute flares. If stable they can be used at the lowest maintenance dose as prevention. Infection is a secondary complication of asthma and the main thrust should be treating the inflammation and bronchospasm. Please verify colonoscopy done in 2014. See Dr Forde Dandy about elevated glucose

## 2014-05-20 ENCOUNTER — Telehealth: Payer: Self-pay

## 2014-05-20 ENCOUNTER — Encounter: Payer: Self-pay | Admitting: Internal Medicine

## 2014-05-20 DIAGNOSIS — E119 Type 2 diabetes mellitus without complications: Secondary | ICD-10-CM | POA: Insufficient documentation

## 2014-05-20 DIAGNOSIS — R739 Hyperglycemia, unspecified: Secondary | ICD-10-CM | POA: Insufficient documentation

## 2014-05-20 NOTE — Telephone Encounter (Signed)
05/19/14 office note has been faxed to Dr Carrolyn Meiers

## 2014-05-20 NOTE — Telephone Encounter (Signed)
Message copied by Shelly Coss on Fri May 20, 2014  8:07 AM ------      Message from: Hendricks Limes      Created: Fri May 20, 2014  6:23 AM       Please FAX office visit 5/21 to Dr Carrolyn Meiers ------

## 2015-01-19 ENCOUNTER — Institutional Professional Consult (permissible substitution): Payer: 59 | Admitting: Pulmonary Disease

## 2015-01-25 ENCOUNTER — Ambulatory Visit: Payer: Self-pay | Admitting: Internal Medicine

## 2015-02-02 ENCOUNTER — Institutional Professional Consult (permissible substitution): Payer: Self-pay | Admitting: Pulmonary Disease

## 2015-02-02 ENCOUNTER — Ambulatory Visit (INDEPENDENT_AMBULATORY_CARE_PROVIDER_SITE_OTHER): Payer: 59 | Admitting: Internal Medicine

## 2015-02-02 ENCOUNTER — Encounter: Payer: Self-pay | Admitting: Internal Medicine

## 2015-02-02 VITALS — BP 124/80 | HR 70 | Temp 98.7°F | Ht 65.0 in | Wt 181.0 lb

## 2015-02-02 DIAGNOSIS — J4520 Mild intermittent asthma, uncomplicated: Secondary | ICD-10-CM

## 2015-02-02 DIAGNOSIS — L309 Dermatitis, unspecified: Secondary | ICD-10-CM | POA: Insufficient documentation

## 2015-02-02 DIAGNOSIS — R739 Hyperglycemia, unspecified: Secondary | ICD-10-CM

## 2015-02-02 DIAGNOSIS — K219 Gastro-esophageal reflux disease without esophagitis: Secondary | ICD-10-CM

## 2015-02-02 MED ORDER — FLUTICASONE-SALMETEROL 250-50 MCG/DOSE IN AEPB: 1.0000 | INHALATION_SPRAY | Freq: Two times a day (BID) | RESPIRATORY_TRACT | Status: DC

## 2015-02-02 MED ORDER — MOMETASONE FUROATE 0.1 % EX OINT: TOPICAL_OINTMENT | Freq: Two times a day (BID) | CUTANEOUS | Status: DC

## 2015-02-02 MED ORDER — MONTELUKAST SODIUM 10 MG PO TABS: 10.0000 mg | ORAL_TABLET | Freq: Every day | ORAL | Status: DC

## 2015-02-02 NOTE — Assessment & Plan Note (Signed)
No change in therapy

## 2015-02-02 NOTE — Assessment & Plan Note (Signed)
[place] bid

## 2015-02-02 NOTE — Progress Notes (Signed)
Pre visit review using our clinic review tool, if applicable. No additional management support is needed unless otherwise documented below in the visit note. 

## 2015-02-02 NOTE — Assessment & Plan Note (Signed)
A1c

## 2015-02-02 NOTE — Patient Instructions (Addendum)
Please sign a release of records to Dr Forde Dandy for records related to diabetes & cholesterol

## 2015-02-02 NOTE — Progress Notes (Signed)
   Subjective:    Patient ID: Adam Sullivan, male    DOB: [birth year], 59 y.o.   MRN: [id number]  HPI  His chief concern is rash that he's had intermittently over the last 1.5 years at the right medial malleolar area. This is pruritic and burning. He had used Neosporin remotely without benefit. He's used cortisone 10 which helps if he uses it 2-3 times per day. With interruption of therapy the symptoms recur. There has been no associated fever, chills, sweats,vesicles or purulence.   His asthma has been well controlled; he uses rescue inhaler 2-3 times per month at max. He is on maintenance Advair 1 inhalation twice a day as well as generics Singulair with good control. He denies any paroxysmal nocturnal dyspnea.   Reflux is well controlled on the protein pump inhibitor. He is concerned as his brother had anal cancer and died recently. Colonoscopy done 2/14. He has no GI symptoms.   He has had hyperglycemia & dyslipidemia. He states Dr Forde Dandy monitors his thyroid , glucose & lipids. Copies of labs requested for EMR inclusion.  Review of Systems  Extrinsic symptoms of itchy, watery eyes, sneezing, or angioedema are denied. There is no significant cough, sputum production, wheezing,or  paroxysmal nocturnal dyspnea. Unexplained weight loss, abdominal pain, significant dyspepsia, dysphagia, melena, rectal bleeding, or persistently small caliber stools are denied.    Objective:   Physical Exam  Pertinent positive findings include: He has pattern alopecia. There is a operative scar which is well-healed over the superior right for head. Gynecomastia is noted. He has a grade 1 systolic murmur at the right base when supine. He has crepitus of his knees. Arches are high. Toenails are thickened and deformed. He has a dry erythematous lesion 8 x 4 cm of the right medial ankle area. There is no maceration or purulence.  General appearance :adequately nourished; in no distress. Eyes: No conjunctival  inflammation or scleral icterus is present. Oral exam: Dental hygiene is good. Lips and gums are healthy appearing.There is no oropharyngeal erythema or exudate noted.  Heart:  Normal rate and regular rhythm. S1 and S2 normal without gallop, click, rub or other extra sounds   Lungs:Chest clear to auscultation; no wheezes, rhonchi,rales ,or rubs present.No increased work of breathing.  Abdomen: bowel sounds normal, soft and non-tender without masses, organomegaly or hernias noted.  No guarding or rebound. Vascular : all pulses equal ; no bruits present. Skin:Warm & dry.  No jaundice or tenting Lymphatic: No lymphadenopathy is noted about the head, neck, axilla areas.  Neuro: Strength, tone & DTRs normal.         Assessment & Plan:  #1 eczematoid dermatitis  #2 asthma, well-controlled new progress   #3 reflux, controlled  #4 family history of anal cancer. Colonoscopy will be pursued.

## 2015-03-03 ENCOUNTER — Encounter: Payer: Self-pay | Admitting: Internal Medicine

## 2015-03-03 ENCOUNTER — Ambulatory Visit (INDEPENDENT_AMBULATORY_CARE_PROVIDER_SITE_OTHER): Payer: 59 | Admitting: Internal Medicine

## 2015-03-03 VITALS — BP 132/100 | HR 56 | Temp 97.7°F | Ht 65.0 in | Wt 180.4 lb

## 2015-03-03 DIAGNOSIS — R030 Elevated blood-pressure reading, without diagnosis of hypertension: Secondary | ICD-10-CM

## 2015-03-03 DIAGNOSIS — R51 Headache: Secondary | ICD-10-CM

## 2015-03-03 DIAGNOSIS — R509 Fever, unspecified: Secondary | ICD-10-CM

## 2015-03-03 DIAGNOSIS — R519 Headache, unspecified: Secondary | ICD-10-CM

## 2015-03-03 MED ORDER — CEPHALEXIN 500 MG PO CAPS: 500.0000 mg | ORAL_CAPSULE | Freq: Two times a day (BID) | ORAL | Status: DC

## 2015-03-03 NOTE — Progress Notes (Signed)
Pre visit review using our clinic review tool, if applicable. No additional management support is needed unless otherwise documented below in the visit note. 

## 2015-03-03 NOTE — Progress Notes (Signed)
   Subjective:    Patient ID: Adam Sullivan, male    DOB: 02/08/1956, 59 y.o.   MRN: [id number]  HPI Today he developed acute headache over the frontal areas and crown described as dull up to level VI. He took pain medicine from his headache specialist,Dr Domingo Cocking, with improvement. He had associated profound sweating; he felt hot but did not measured his fever. He's had no localizing signs or symptoms otherwise ;although he did miss a few days of pressing his interventricular shunt to equilibrate pressure. Normally he does this 2 times a week.  He has taken the flu shot.   Review of Systems  Facial pain , nasal purulence, dental pain, sore throat , otic pain or otic discharge denied.  Cough, sputum production, hemoptysis, or pleuritic pain denied.  No diarrhea present.Cola colored urine or clay colored stools denied.  Dysuria, pyuria, or hematuria not present.  No vaginal discharge or bleeding noted.  No new rashes, pustules, vesicles.  No redness or swelling of joints.  No significant travel, pets, or tick exposures.     Objective:   Physical Exam Pertinent or positive findings include : Pattern alopecia is present. Shunt is palpable in the right neck and over the right crown.No associated cellulitis,swelling or pain. Abdomen is protuberant with striae.  Reflexes are hyper reactive ,1. 5-2 plus but equal  Normal range of motion of the neck & lower extremities w/o meningismus.  General appearance:Adequately nourished; no acute distress or increased work of breathing is present.  No  lymphadenopathy about the head, neck, or axilla noted.  Eyes: No conjunctival inflammation or lid edema is present. There is no scleral icterus. Ears:  External ear exam shows no significant lesions or deformities.  Otoscopic examination reveals clear canals, tympanic membranes are intact bilaterally without bulging, retraction, inflammation or discharge. Nose:  External nasal examination shows no  deformity or inflammation. Nasal mucosa are pink and moist without lesions or exudates. No septal dislocation or deviation.No obstruction to airflow.  Oral exam: Dental hygiene is good; lips and gums are healthy appearing.There is no oropharyngeal erythema or exudate noted.  Neck:  No deformities, thyromegaly, masses, or tenderness noted.    Heart:  Normal rate and regular rhythm. S1 and S2 normal without gallop, murmur, click, rub or other extra sounds. Lungs:Chest clear to auscultation; no wheezes, rhonchi,rales ,or rubs present. Extremities:  No cyanosis, edema, or clubbing  noted  Skin: Warm & dry w/o jaundice or tenting.     Assessment & Plan:  #1 fever without localizing signs or symptoms #2 headache #3 elevated BP w/o dx of hypertension  See orders and after visit summary

## 2015-03-03 NOTE — Patient Instructions (Addendum)
  Please keep a diary recording all episodes of chills ,fever or sweats.Actually measure temperature if these occur. Also record any associated symptoms such as headache, rash, change in bowels, pain with urination, etc as this may help define cause of chills or fever.    Fill the  prescription for antibiotic if fever, discolored nasal or chest secretions or significant pain above & below eyes appear in the next 48-72 hours.    Minimal Blood Pressure Goal= AVERAGE < 140/90;  Ideal is an AVERAGE < 135/85. This AVERAGE should be calculated from @ least 5-7 BP readings taken @ different times of day on different days of week. You should not respond to isolated BP readings , but rather the AVERAGE for that week .Please bring your  blood pressure cuff to office visits to verify that it is reliable.It  can also be checked against the blood pressure device at the pharmacy. Finger or wrist cuffs are not dependable; an arm cuff is.

## 2015-12-14 ENCOUNTER — Encounter: Payer: Self-pay | Admitting: Internal Medicine

## 2015-12-14 ENCOUNTER — Ambulatory Visit (INDEPENDENT_AMBULATORY_CARE_PROVIDER_SITE_OTHER): Payer: 59 | Admitting: Internal Medicine

## 2015-12-14 VITALS — BP 134/86 | HR 66 | Temp 98.1°F | Ht 65.0 in | Wt 178.0 lb

## 2015-12-14 DIAGNOSIS — J4520 Mild intermittent asthma, uncomplicated: Secondary | ICD-10-CM

## 2015-12-14 MED ORDER — FLUTICASONE-SALMETEROL 250-50 MCG/DOSE IN AEPB: 1.0000 | INHALATION_SPRAY | Freq: Two times a day (BID) | RESPIRATORY_TRACT | Status: DC

## 2015-12-14 MED ORDER — MONTELUKAST SODIUM 10 MG PO TABS: 10.0000 mg | ORAL_TABLET | Freq: Every day | ORAL | Status: DC

## 2015-12-14 MED ORDER — ALBUTEROL SULFATE HFA 108 (90 BASE) MCG/ACT IN AERS: 2.0000 | INHALATION_SPRAY | Freq: Four times a day (QID) | RESPIRATORY_TRACT | Status: DC | PRN

## 2015-12-14 NOTE — Progress Notes (Signed)
Pre visit review using our clinic review tool, if applicable. No additional management support is needed unless otherwise documented below in the visit note. 

## 2015-12-14 NOTE — Progress Notes (Signed)
   Subjective:    Patient ID: Adam Sullivan, male    DOB: 11/03/1956, 59 y.o.   MRN: [id number]  HPI Rhinoconjunctivitis symptoms are mainly seasonal although he does have some pet exposures perennially. He will have mild intermittent symptomatology of itchy, watery eyes, and sneezing. He will have occasional shortness of breath or wheezing; but this is less than twice a week on average. He will use a rescue inhaler with the shortness of breath.  He has been stable using Advair 1 elevation twice a day and the Singulair and the evening.  He also has some reflux symptoms which are for the most part controlled by Protonix.   Review of Systems Frontal headache, facial pain , nasal purulence, dental pain, sore throat , otic pain or otic discharge denied. No fever , chills or sweats. Unexplained weight loss, abdominal pain, significant dyspepsia, dysphagia, melena, rectal bleeding, or persistently small caliber stools are denied.    Objective:   Physical Exam Pertinent or positive findings include: Pattern alopecia is present. There is a surgical deficit over the right lateral forehead. Nares are slightly dry. Striae are noted over the abdomen. He has mild crepitus of the knees.  General appearance :adequately nourished; in no distress.  Eyes: No conjunctival inflammation or scleral icterus is present.  Oral exam:  Lips and gums are healthy appearing.There is no oropharyngeal erythema or exudate noted. Dental hygiene is good.  Heart:  Normal rate and regular rhythm. S1 and S2 normal without gallop, murmur, click, rub or other extra sounds    Lungs:Chest clear to auscultation; no wheezes, rhonchi,rales ,or rubs present.No increased work of breathing.   Abdomen: bowel sounds normal, soft and non-tender without masses, organomegaly or hernias noted.  No guarding or rebound.   Vascular : all pulses equal ; no bruits present.  Skin:Warm & dry.  Intact without suspicious lesions or rashes ; no  tenting or jaundice   Lymphatic: No lymphadenopathy is noted about the head, neck, axilla.   Neuro: Strength, tone normal.    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

## 2015-12-14 NOTE — Patient Instructions (Signed)
Plain Mucinex (NOT D) for thick secretions ;force NON dairy fluids .   Nasal cleansing in the shower as discussed with lather of mild shampoo.After 10 seconds wash off lather while  exhaling through nostrils. Make sure that all residual soap is removed to prevent irritation.  Flonase OR Nasacort AQ 1 spray in each nostril twice a day as needed. Use the "crossover" technique into opposite nostril spraying toward opposite ear @ 45 degree angle, not straight up into nostril.  Plain Allegra (NOT D )  160 daily , Loratidine 10 mg , OR Zyrtec 10 mg @ bedtime  as needed for itchy eyes & sneezing.  Reflux of gastric acid may be asymptomatic as this may occur mainly during sleep.The triggers for reflux  include stress; the "aspirin family" ; alcohol; peppermint; and caffeine (coffee, tea, cola, and chocolate). The aspirin family would include aspirin and the nonsteroidal agents such as ibuprofen &  Naproxen. Tylenol would not cause reflux. If having symptoms ; food & drink should be avoided for @ least 2 hours before going to bed.  

## 2016-03-05 ENCOUNTER — Other Ambulatory Visit: Payer: Self-pay | Admitting: Internal Medicine

## 2016-03-05 NOTE — Telephone Encounter (Signed)
Patients pcp is showing to be dr south---i have routed rx request for advair to dr Forde Dandy

## 2016-09-23 ENCOUNTER — Encounter: Payer: Self-pay | Admitting: Internal Medicine

## 2016-09-23 ENCOUNTER — Ambulatory Visit (INDEPENDENT_AMBULATORY_CARE_PROVIDER_SITE_OTHER): Payer: Commercial Managed Care - HMO | Admitting: Internal Medicine

## 2016-09-23 VITALS — BP 154/90 | HR 53 | Temp 98.0°F | Resp 16 | Ht 65.0 in | Wt 178.0 lb

## 2016-09-23 DIAGNOSIS — J4520 Mild intermittent asthma, uncomplicated: Secondary | ICD-10-CM

## 2016-09-23 DIAGNOSIS — R519 Headache, unspecified: Secondary | ICD-10-CM

## 2016-09-23 DIAGNOSIS — Z23 Encounter for immunization: Secondary | ICD-10-CM

## 2016-09-23 DIAGNOSIS — R739 Hyperglycemia, unspecified: Secondary | ICD-10-CM

## 2016-09-23 DIAGNOSIS — E039 Hypothyroidism, unspecified: Secondary | ICD-10-CM

## 2016-09-23 DIAGNOSIS — E785 Hyperlipidemia, unspecified: Secondary | ICD-10-CM

## 2016-09-23 DIAGNOSIS — K219 Gastro-esophageal reflux disease without esophagitis: Secondary | ICD-10-CM | POA: Diagnosis not present

## 2016-09-23 DIAGNOSIS — R51 Headache: Secondary | ICD-10-CM

## 2016-09-23 DIAGNOSIS — Z8546 Personal history of malignant neoplasm of prostate: Secondary | ICD-10-CM | POA: Insufficient documentation

## 2016-09-23 DIAGNOSIS — I10 Essential (primary) hypertension: Secondary | ICD-10-CM | POA: Insufficient documentation

## 2016-09-23 DIAGNOSIS — R030 Elevated blood-pressure reading, without diagnosis of hypertension: Secondary | ICD-10-CM

## 2016-09-23 MED ORDER — SYNTHROID 75 MCG PO TABS: 75.0000 ug | ORAL_TABLET | Freq: Every day | ORAL | 5 refills | Status: DC

## 2016-09-23 MED ORDER — MONTELUKAST SODIUM 10 MG PO TABS: 10.0000 mg | ORAL_TABLET | Freq: Every day | ORAL | 11 refills | Status: DC

## 2016-09-23 MED ORDER — CETIRIZINE HCL 10 MG PO CAPS: 10.0000 mg | ORAL_CAPSULE | Freq: Every day | ORAL | Status: DC

## 2016-09-23 NOTE — Progress Notes (Signed)
Pre visit review using our clinic review tool, if applicable. No additional management support is needed unless otherwise documented below in the visit note. 

## 2016-09-23 NOTE — Assessment & Plan Note (Addendum)
Check tsh  Titrate med dose if needed Managed by Dr Forde Dandy, but we will check

## 2016-09-23 NOTE — Assessment & Plan Note (Signed)
GERD controlled Continue daily medication  

## 2016-09-23 NOTE — Assessment & Plan Note (Addendum)
Overall controlled Continue singulair, zyrtec, advair, albuterol as needed Flu vaccine today

## 2016-09-23 NOTE — Assessment & Plan Note (Signed)
Not following with urology  Will check psa 

## 2016-09-23 NOTE — Assessment & Plan Note (Signed)
Taking vytorin - continue Check lipid panel

## 2016-09-23 NOTE — Assessment & Plan Note (Signed)
?   Fasting or not Check a1c

## 2016-09-23 NOTE — Progress Notes (Signed)
Subjective:    Patient ID: Adam Sullivan, male    DOB: Aug 03, 1956, 60 y.o.   MRN: [id number]  HPI He is here to establish with a new pcp.   He is here for follow up.  Asthma:  He uses the advair twice a day.  He uses a albuterol inhaler twice a week.  He takes Singulair at night.    GERD:  He is taking his medication daily as prescribed.  He denies any GERD symptoms and feels his GERD is well controlled.   Hypothyroidism:  He is seeing an endocrinologist - Dr Forde Dandy. He is taking his medication daily.  He denies any recent changes in energy or weight that are unexplained.  He has been less active which may contribute to the weight gain.    Medications and allergies reviewed with patient and updated if appropriate.  Patient Active Problem List   Diagnosis Date Noted  . History of prostate cancer 09/23/2016  . Hyperglycemia 05/20/2014  . Brain tumor (benign) ([place]) 06/04/2011  . GERD (gastroesophageal reflux disease) 06/04/2011  . Dyslipidemia 06/04/2011  . Headache disorder 06/04/2011  . Hypothyroidism 03/18/2008  . Asthma 03/18/2008    Current Outpatient Prescriptions on File Prior to Visit  Medication Sig Dispense Refill  . ADVAIR DISKUS 250-50 MCG/DOSE AEPB USE 1 PUFF EVERY 12 HOURS. 60 each 0  . albuterol (PROVENTIL HFA;VENTOLIN HFA) 108 (90 BASE) MCG/ACT inhaler Inhale 2 puffs into the lungs every 6 (six) hours as needed for wheezing. 1 Inhaler 2  . ezetimibe-simvastatin (VYTORIN) 10-40 MG per tablet Take 1 tablet by mouth at bedtime.      . gabapentin (NEURONTIN) 300 MG capsule Take 300 mg by mouth 3 (three) times daily.      Marland Kitchen levothyroxine (SYNTHROID, LEVOTHROID) 75 MCG tablet Take 75 mcg by mouth daily before breakfast.     . montelukast (SINGULAIR) 10 MG tablet Take 1 tablet (10 mg total) by mouth at bedtime. 30 tablet 11  . pantoprazole (PROTONIX) 40 MG tablet Take 40 mg by mouth daily.       No current facility-administered medications on file prior to visit.      Past Medical History:  Diagnosis Date  . Allergy   . Anxiety   . Asthma    severe, Dr. Linna Darner  . Blood clots in brain    history of   . Brain tumor ([place]) 1993  . Depression    situational  . GERD (gastroesophageal reflux disease)    severe  . Headache(784.0)   . Hyperlipidemia   . Orchalgia   . Pneumonia    hx of  . Prostate cancer ([facility]) 07/01/13   gleason 6  . Thyroid disease    hypothyroidism    Past Surgical History:  Procedure Laterality Date  . brain shunt  1993   put in, removed, put in again  . LYMPHADENECTOMY Bilateral 10/22/2013   Procedure: LYMPHADENECTOMY;  Surgeon: Alexis Frock, MD;  Location: [facility];  Service: Urology;  Laterality: Bilateral;  . 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 removed blood clot     X 2  . ROBOT ASSISTED LAPAROSCOPIC RADICAL PROSTATECTOMY N/A 10/22/2013   Procedure: ROBOTIC ASSISTED LAPAROSCOPIC RADICAL PROSTATECTOMY   PRE- PERITONEAL APPROACH;  Surgeon: Alexis Frock, MD;  Location: [facility];  Service: Urology;  Laterality: N/A;  3.5 HRS     Social History   Social History  . Marital status: Married    Spouse name:  N/A  . Number of children: N/A  . Years of education: N/A   Social History Main Topics  . Smoking status: Never Smoker  . Smokeless tobacco: Never Used  . Alcohol use Yes     Comment: Rarely  . Drug use: No  . Sexual activity: Not Asked   Other Topics Concern  . None   Social History Narrative   Exercise: none    Family History  Problem Relation Age of Onset  . Cancer Brother     anal cancer  . Asthma Maternal Uncle   . Cancer Father     prostate  cancer  . Testicular cancer Father   . Coronary artery disease Mother 74    5 vessel CABG  . Colon cancer Neg Hx   . Stomach cancer Neg Hx     Review of Systems  Constitutional: Negative for chills and fever.  Eyes: Positive for visual disturbance (blurriness at times).  Respiratory: Positive for cough (infrequent), shortness of  breath and wheezing (occasional).   Cardiovascular: Negative for chest pain, palpitations and leg swelling.  Gastrointestinal: Negative for abdominal pain and nausea.  Genitourinary: Negative for difficulty urinating, dysuria and hematuria.  Neurological: Positive for light-headedness (at times) and headaches. Negative for dizziness, weakness and numbness.       Objective:   Vitals:   09/23/16 1509  BP: (!) 154/90  Pulse: (!) 53  Resp: 16  Temp: 98 F (36.7 C)   Filed Weights   09/23/16 1509  Weight: 178 lb (80.7 kg)   Body mass index is 29.62 kg/m.   Physical Exam Constitutional: Appears well-developed and well-nourished. No distress.  HENT:  Head: Normocephalic and atraumatic.  Neck: VP shunt in place on right.  Neck supple. No tracheal deviation present. No thyromegaly present.  Cardiovascular: Normal rate, regular rhythm and normal heart sounds.   No murmur heard.  No carotid bruit  Pulmonary/Chest: Effort normal and breath sounds normal. No respiratory distress. No has no wheezes. No rales.  Musculoskeletal: No edema.  Lymphadenopathy: No cervical adenopathy.  Skin: Skin is warm and dry. Not diaphoretic.  Psychiatric: Normal mood and affect. Behavior is normal.       Assessment & Plan:   See Problem List for Assessment and Plan of chronic medical problems.  Flu vaccine   F/u  Annually, sooner if needed

## 2016-09-23 NOTE — Assessment & Plan Note (Addendum)
Seeing a specialist Taking gabapentin daily Takes tizanidine as needed for severe headaches

## 2016-09-23 NOTE — Patient Instructions (Addendum)
Your goal BP is less than 140/90 on average  Test(s) ordered today. Your results will be released to [place] (or called to you) after review, usually within 72hours after test completion. If any changes need to be made, you will be notified at that same time.  All other Health Maintenance issues reviewed.   All recommended immunizations and age-appropriate screenings are up-to-date or discussed.  Flu vaccine administered today.   Medications reviewed and updated.  No changes recommended at this time.  Your prescription(s) have been submitted to your pharmacy. Please take as directed and contact our office if you believe you are having problem(s) with the medication(s).   Please followup in one year

## 2016-09-23 NOTE — Assessment & Plan Note (Signed)
Advised him to monitor - discussed goal Low sodium diet Start exercise Lose weight Return if BP elevated

## 2016-10-08 ENCOUNTER — Telehealth: Payer: Self-pay | Admitting: Internal Medicine

## 2016-10-08 NOTE — Telephone Encounter (Signed)
Rec'd from [facility] forward 5 pages to Dr.Burns

## 2016-11-26 ENCOUNTER — Institutional Professional Consult (permissible substitution): Payer: Commercial Managed Care - HMO | Admitting: Neurology

## 2017-03-19 ENCOUNTER — Encounter: Payer: Self-pay | Admitting: Internal Medicine

## 2017-03-19 ENCOUNTER — Ambulatory Visit (INDEPENDENT_AMBULATORY_CARE_PROVIDER_SITE_OTHER): Payer: Commercial Managed Care - HMO | Admitting: Internal Medicine

## 2017-03-19 ENCOUNTER — Ambulatory Visit (INDEPENDENT_AMBULATORY_CARE_PROVIDER_SITE_OTHER)
Admission: RE | Admit: 2017-03-19 | Discharge: 2017-03-19 | Disposition: A | Discharge status: Home / Self Care | Payer: Commercial Managed Care - HMO | Source: Ambulatory Visit | Attending: Internal Medicine | Admitting: Internal Medicine

## 2017-03-19 DIAGNOSIS — M25552 Pain in left hip: Secondary | ICD-10-CM | POA: Diagnosis not present

## 2017-03-19 DIAGNOSIS — M25559 Pain in unspecified hip: Secondary | ICD-10-CM | POA: Insufficient documentation

## 2017-03-19 MED ORDER — MELOXICAM 7.5 MG PO TABS: 7.5000 mg | ORAL_TABLET | Freq: Every day | ORAL | 0 refills | Status: DC

## 2017-03-19 NOTE — Assessment & Plan Note (Signed)
Left upper thigh / hip pain x 1 month No injury ? Arthritis, vs muscle/ligament strain Xray today Refer to dr Tamala Julian for further evaluation

## 2017-03-19 NOTE — Progress Notes (Signed)
Pre visit review using our clinic review tool, if applicable. No additional management support is needed unless otherwise documented below in the visit note. 

## 2017-03-19 NOTE — Progress Notes (Signed)
Subjective:    Patient ID: Adam Sullivan, male    DOB: [birth year], 61 y.o.   MRN: [id number]  HPI The patient is here for an acute visit for leg pain.  Left upper thigh pain:  It started about one month ago.  He denies any trauma or injury.  It is mostly in the upper latearl leg.  It hurts with walking, putting weight on it, up and down stairs.  No pain with laying or stitting. No radiation down leg or numbness/tingling.  no back pain.  Some weakness in left leg.  He has taking tylenol for it, which helps a little.  He has not tried advil.    Medications and allergies reviewed with patient and updated if appropriate.  Patient Active Problem List   Diagnosis Date Noted  . Hip pain 03/19/2017  . History of prostate cancer 09/23/2016  . Elevated blood pressure (not hypertension) 09/23/2016  . Hyperglycemia 05/20/2014  . Brain tumor (benign) ([facility]) 06/04/2011  . GERD (gastroesophageal reflux disease) 06/04/2011  . Dyslipidemia 06/04/2011  . Headache disorder 06/04/2011  . Hypothyroidism 03/18/2008  . Asthma 03/18/2008    Current Outpatient Prescriptions on File Prior to Visit  Medication Sig Dispense Refill  . ADVAIR DISKUS 250-50 MCG/DOSE AEPB USE 1 PUFF EVERY 12 HOURS. 60 each 0  . albuterol (PROVENTIL HFA;VENTOLIN HFA) 108 (90 BASE) MCG/ACT inhaler Inhale 2 puffs into the lungs every 6 (six) hours as needed for wheezing. 1 Inhaler 2  . ezetimibe-simvastatin (VYTORIN) 10-40 MG per tablet Take 1 tablet by mouth at bedtime.      . gabapentin (NEURONTIN) 300 MG capsule Take 300 mg by mouth 3 (three) times daily.      . montelukast (SINGULAIR) 10 MG tablet Take 1 tablet (10 mg total) by mouth at bedtime. 30 tablet 11  . pantoprazole (PROTONIX) 40 MG tablet Take 40 mg by mouth daily.      Marland Kitchen tiZANidine (ZANAFLEX) 4 MG tablet Take 4 mg by mouth every 6 (six) hours as needed for muscle spasms.     No current facility-administered medications on file prior to visit.     Past Medical  History:  Diagnosis Date  . Allergy   . Anxiety   . Asthma    severe, Dr. Linna Darner  . Blood clots in brain    history of   . Brain tumor ([facility]) 1993  . Depression    situational  . GERD (gastroesophageal reflux disease)    severe  . Headache(784.0)   . Hyperlipidemia   . Orchalgia   . Pneumonia    hx of  . Prostate cancer ([place] [facility]) 07/01/13   gleason 6  . Thyroid disease    hypothyroidism    Past Surgical History:  Procedure Laterality Date  . brain shunt  1993   put in, removed, put in again  . LYMPHADENECTOMY Bilateral 10/22/2013   Procedure: LYMPHADENECTOMY;  Surgeon: Alexis Frock, MD;  Location: [facility];  Service: Urology;  Laterality: Bilateral;  . 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 removed blood clot     X 2  . ROBOT ASSISTED LAPAROSCOPIC RADICAL PROSTATECTOMY N/A 10/22/2013   Procedure: ROBOTIC ASSISTED LAPAROSCOPIC RADICAL PROSTATECTOMY   PRE- PERITONEAL APPROACH;  Surgeon: Alexis Frock, MD;  Location: [facility];  Service: Urology;  Laterality: N/A;  3.5 HRS     Social History   Social History  . Marital status: Married    Spouse name: N/A  .  Number of children: N/A  . Years of education: N/A   Social History Main Topics  . Smoking status: Never Smoker  . Smokeless tobacco: Never Used  . Alcohol use Yes     Comment: Rarely  . Drug use: No  . Sexual activity: Not Asked   Other Topics Concern  . None   Social History Narrative   Exercise: none    Family History  Problem Relation Age of Onset  . Cancer Brother     anal cancer  . Asthma Maternal Uncle   . Cancer Father     prostate  cancer  . Testicular cancer Father   . Coronary artery disease Mother 86    5 vessel CABG  . Colon cancer Neg Hx   . Stomach cancer Neg Hx     Review of Systems  Constitutional: Negative for chills and fever.  Cardiovascular: Negative for leg swelling.  Musculoskeletal: Negative for back pain.  Skin: Negative for color change.  Neurological:  Positive for weakness (left leg  ). Negative for numbness.       Objective:   Vitals:   03/19/17 1137  BP: (!) 148/74  Pulse: 70  Temp: 98.1 F (36.7 C)   Wt Readings from Last 3 Encounters:  03/19/17 182 lb 8 oz (82.8 kg)  09/23/16 178 lb (80.7 kg)  12/14/15 178 lb (80.7 kg)   Body mass index is 30.37 kg/m.   Physical Exam  Constitutional: He appears well-developed and well-nourished. No distress.  Musculoskeletal: He exhibits no edema.  Pain with palpation proximal lateral left thigh and lateral thigh, increased pain with flexion, adduction and abduction, no pain with straight leg raise   Neurological:  Normal sensation left lower leg  Skin: Skin is warm and dry. He is not diaphoretic.         Assessment & Plan:    See Problem List for Assessment and Plan of chronic medical problems.

## 2017-03-19 NOTE — Patient Instructions (Addendum)
Have an xray of your hip.  We will call you with the results.   Start meloxicam for your hip pain.    Make an appointment with Dr Tamala Julian for further evaluation.

## 2017-03-20 NOTE — Telephone Encounter (Signed)
FYI

## 2017-04-08 ENCOUNTER — Ambulatory Visit: Payer: Commercial Managed Care - HMO | Admitting: Family Medicine

## 2017-04-30 NOTE — Progress Notes (Signed)
[facility] [address], [place] [address] Phone: [phone] Subjective:    I'm seeing this patient by the request  of:  Binnie Rail, MD   CC: left hip pain  [id number]  Adam Sullivan is a 61 y.o. male coming in with complaint of left hip pain.  Patient states that it seems to be more of the thigh pain. 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 Patient rates the severity of the pain is 6 out of 10 but does seem to be improving over the course last week. Seems to be more on the anterior thigh and groin pain. Patient states that it's somewhat worse with activity but sometimes seems to begin better as well. Denies any association with bowel habits.    patient did have x-rays taken on 03/19/2017. These were independently visualized by me. Left hip showed no significant bony abnormality but patient does have a shunt catheter fragments noted. Patient does not have moderate to severe degenerative disc disease of the lumbar spine noted.  Past Medical History:  Diagnosis Date  . Allergy   . Anxiety   . Asthma    severe, Dr. Linna Darner  . Blood clots in brain    history of   . Brain tumor ([place]) 1993  . Depression    situational  . GERD (gastroesophageal reflux disease)    severe  . Headache(784.0)   . Hyperlipidemia   . Orchalgia   . Pneumonia    hx of  . Prostate cancer ([place]) 07/01/13   gleason 6  . Thyroid disease    hypothyroidism   Past Surgical History:  Procedure Laterality Date  . brain shunt  1993   put in, removed, put in again  . LYMPHADENECTOMY Bilateral 10/22/2013   Procedure: LYMPHADENECTOMY;  Surgeon: Alexis Frock, MD;  Location: [facility];  Service: Urology;  Laterality: Bilateral;  . 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 removed blood clot     X 2  . ROBOT ASSISTED LAPAROSCOPIC RADICAL PROSTATECTOMY N/A 10/22/2013   Procedure: ROBOTIC ASSISTED  LAPAROSCOPIC RADICAL PROSTATECTOMY   PRE- PERITONEAL APPROACH;  Surgeon: Alexis Frock, MD;  Location[facility];  Service: Urology;  Laterality: N/A;  3.5 HRS    Social History   Social History  . Marital status: Married    Spouse name: N/A  . Number of children: N/A  . Years of education: N/A   Social History Main Topics  . Smoking status: Never Smoker  . Smokeless tobacco: Never Used  . Alcohol use Yes     Comment: Rarely  . Drug use: No  . Sexual activity: Not on file   Other Topics Concern  . Not on file   Social History Narrative   Exercise: none   Allergies  Allergen Reactions  . Chocolate   . Cola   . Dust Mite Extract   . Erythromycin Other (See Comments)    Pt unsure of what the reaction was. Happen as a child.    Family History  Problem Relation Age of Onset  . Cancer Brother     anal cancer  . Asthma Maternal Uncle   . Cancer Father     prostate  cancer  . Testicular cancer Father   . Coronary artery disease Mother 56    5 vessel CABG  . Colon cancer Neg Hx   .  Stomach cancer Neg Hx     Past medical history, social, surgical and family history all reviewed in electronic medical record.  No pertanent information unless stated regarding to the chief complaint.   Review of Systems:Review of systems updated and as accurate as of 04/30/17  No , visual changes, nausea, vomiting, diarrhea, constipation, dizziness, abdominal pain, skin rash, fevers, chills, night sweats, weight loss, swollen lymph nodes, body aches, joint swelling, chest pain, shortness of breath, mood changes.  Positive muscle aches positive headaches  Objective  There were no vitals taken for this visit. Systems examined below as of 04/30/17   General: No apparent distress alert and oriented x3 mood and affect normal, dressed appropriately.  HEENT: Pupils equal, extraocular movements intact  Respiratory: Patient's speak in full sentences and does not appear short of breath    Cardiovascular: No lower extremity edema, non tender, no erythema  Skin: Warm dry intact with no signs of infection or rash on extremities or on axial skeleton.  Abdomen: Soft nontender  Neuro: Cranial nerves II through XII are intact, neurovascularly intact in all extremities with 2+ DTRs and 2+ pulses.  Lymph: No lymphadenopathy of posterior or anterior cervical chain or axillae bilaterally.  Gait normal with good balance and coordination.  MSK:  Non tender with full range of motion and good stability and symmetric strength and tone of shoulders, elbows, wrist,  knee and ankles bilaterally.  Hip: Left ROM IR: 25 Deg, ER: 45 Deg, Flexion: 120 Deg, Extension: 100 Deg, Abduction: 45 Deg, Adduction: 15 Deg Strength IR: 4/5, ER: 5/5, Flexion: 5/5, Extension: 5/5, Abduction: 5/5, Adduction: 4/5 with increasing pain Pelvic alignment unremarkable to inspection and palpation. Standing hip rotation and gait without trendelenburg sign / unsteadiness. Greater trochanter without tenderness to palpation. No tenderness over piriformis and greater trochanter. No pain with FABER or FADIR. No SI joint tenderness and normal minimal SI movement.  Procedure note [id number]; 15 minutes spent for Therapeutic exercises as stated in above notes.  This included exercises focusing on stretching, strengthening, with significant focus on eccentric aspects.Hip strengthening exercises which included:  Pelvic tilt/bracing to help with proper recruitment of the lower abs and pelvic floor muscles with theraband.  Glute strengthening to properly contract glutes without over-engaging low back and hamstrings - prone hip extension and glute bridge exercises Proper stretching techniques to increase effectiveness for the hip flexors, groin, quads, piriformic and low back when appropriate      Proper technique shown and discussed handout in great detail with ATC.  All questions were discussed and answered.     Impression and  Recommendations:     This case required medical decision making of moderate complexity.      Note: This dictation was prepared with Dragon dictation along with smaller phrase technology. Any transcriptional errors that result from this process are unintentional.

## 2017-05-01 ENCOUNTER — Encounter: Payer: Self-pay | Admitting: Family Medicine

## 2017-05-01 ENCOUNTER — Ambulatory Visit (INDEPENDENT_AMBULATORY_CARE_PROVIDER_SITE_OTHER): Payer: Commercial Managed Care - HMO | Admitting: Family Medicine

## 2017-05-01 DIAGNOSIS — S76212A Strain of adductor muscle, fascia and tendon of left thigh, initial encounter: Secondary | ICD-10-CM | POA: Diagnosis not present

## 2017-05-01 NOTE — Patient Instructions (Signed)
Good to see you.   Ice 20 minutes 2 times daily. Usually after activity and before bed. Exercises 3 times a week.  Avoid heavy lifting.  Thigh compression sleeve would be great daily for next 2 weeks then with activity after that for a month Over the counter Vitamin D 2000 IU daily  Turmeric 500mg  daily but watch for reflux See me again in 4 weeks to make sure you are better

## 2017-05-01 NOTE — Assessment & Plan Note (Signed)
Patient does have what appeared to be more on the left groin strain. Patient has had back issues before and this could be some radicular symptoms. No sign of any type of hernia that can also be contributing to this. Does not appear to be a intra-articular hip pathology. Discussed with patient about home exercises and patient work with Product/process development scientist in greater detail. We discussed icing regimen in which activities to do in which ones to avoid. Patient well try conservative therapy and follow-up with me again in 4 weeks.

## 2017-05-01 NOTE — Progress Notes (Signed)
Pre-visit discussion using our clinic review tool. No additional management support is needed unless otherwise documented below in the visit note.  

## 2017-05-29 ENCOUNTER — Ambulatory Visit: Payer: Commercial Managed Care - HMO | Admitting: Family Medicine

## 2017-06-11 DIAGNOSIS — R51 Headache: Secondary | ICD-10-CM | POA: Diagnosis not present

## 2017-06-19 ENCOUNTER — Ambulatory Visit: Payer: Commercial Managed Care - HMO | Admitting: Family Medicine

## 2017-11-13 ENCOUNTER — Other Ambulatory Visit: Payer: Self-pay | Admitting: Internal Medicine

## 2017-11-13 DIAGNOSIS — J4520 Mild intermittent asthma, uncomplicated: Secondary | ICD-10-CM

## 2017-11-18 ENCOUNTER — Other Ambulatory Visit: Payer: Self-pay | Admitting: Internal Medicine

## 2017-11-18 DIAGNOSIS — J4520 Mild intermittent asthma, uncomplicated: Secondary | ICD-10-CM

## 2018-04-08 DIAGNOSIS — D649 Anemia, unspecified: Secondary | ICD-10-CM | POA: Diagnosis not present

## 2018-04-08 DIAGNOSIS — C61 Malignant neoplasm of prostate: Secondary | ICD-10-CM | POA: Diagnosis not present

## 2018-04-08 DIAGNOSIS — E230 Hypopituitarism: Secondary | ICD-10-CM | POA: Diagnosis not present

## 2018-04-08 DIAGNOSIS — E871 Hypo-osmolality and hyponatremia: Secondary | ICD-10-CM | POA: Diagnosis not present

## 2018-05-27 ENCOUNTER — Other Ambulatory Visit: Payer: Self-pay | Admitting: Internal Medicine

## 2018-05-27 DIAGNOSIS — J4520 Mild intermittent asthma, uncomplicated: Secondary | ICD-10-CM
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
## 2018-09-21 ENCOUNTER — Ambulatory Visit: Payer: 59 | Admitting: Internal Medicine

## 2018-09-21 ENCOUNTER — Encounter: Payer: Self-pay | Admitting: Internal Medicine

## 2018-09-21 VITALS — BP 178/100 | HR 63 | Temp 98.4°F | Resp 16 | Ht 65.0 in | Wt 176.0 lb

## 2018-09-21 DIAGNOSIS — R519 Headache, unspecified: Secondary | ICD-10-CM

## 2018-09-21 DIAGNOSIS — R413 Other amnesia: Secondary | ICD-10-CM

## 2018-09-21 DIAGNOSIS — R2681 Unsteadiness on feet: Secondary | ICD-10-CM | POA: Insufficient documentation

## 2018-09-21 DIAGNOSIS — I10 Essential (primary) hypertension: Secondary | ICD-10-CM

## 2018-09-21 DIAGNOSIS — R51 Headache: Secondary | ICD-10-CM | POA: Diagnosis not present

## 2018-09-21 DIAGNOSIS — J4520 Mild intermittent asthma, uncomplicated: Secondary | ICD-10-CM

## 2018-09-21 DIAGNOSIS — H02402 Unspecified ptosis of left eyelid: Secondary | ICD-10-CM | POA: Insufficient documentation

## 2018-09-21 DIAGNOSIS — M25512 Pain in left shoulder: Secondary | ICD-10-CM | POA: Insufficient documentation

## 2018-09-21 DIAGNOSIS — R269 Unspecified abnormalities of gait and mobility: Secondary | ICD-10-CM | POA: Insufficient documentation

## 2018-09-21 MED ORDER — LISINOPRIL 10 MG PO TABS: 10.0000 mg | ORAL_TABLET | Freq: Every day | ORAL | 3 refills | Status: DC

## 2018-09-21 MED ORDER — ALBUTEROL SULFATE HFA 108 (90 BASE) MCG/ACT IN AERS: INHALATION_SPRAY | RESPIRATORY_TRACT | 5 refills | Status: DC

## 2018-09-21 MED ORDER — MONTELUKAST SODIUM 10 MG PO TABS: 10.0000 mg | ORAL_TABLET | Freq: Every day | ORAL | 0 refills | Status: DC

## 2018-09-21 NOTE — Assessment & Plan Note (Signed)
Is more concerned of his family than his-has been ongoing for years Likely more a processing and attention/listening issue than true short-term memory problem Discussed possible evaluation by neuropsychology-he deferred today we will just monitor

## 2018-09-21 NOTE — Assessment & Plan Note (Signed)
Well controlled Continue Advair twice daily, Singulair nightly and albuterol as needed Refill sent

## 2018-09-21 NOTE — Patient Instructions (Addendum)
No immunizations administered today.   Medications reviewed and updated.  Changes include :   Starting lisinopril for your blood pressure.    Your prescription(s) have been submitted to your pharmacy. Please take as directed and contact our office if you believe you are having problem(s) with the medication(s).   Please followup in one month for a physical     Hypertension Hypertension, commonly called high blood pressure, is when the force of blood pumping through the arteries is too strong. The arteries are the blood vessels that carry blood from the heart throughout the body. Hypertension forces the heart to work harder to pump blood and may cause arteries to become narrow or stiff. Having untreated or uncontrolled hypertension can cause heart attacks, strokes, kidney disease, and other problems. A blood pressure reading consists of a higher number over a lower number. Ideally, your blood pressure should be below 120/80. The first ("top") number is called the systolic pressure. It is a measure of the pressure in your arteries as your heart beats. The second ("bottom") number is called the diastolic pressure. It is a measure of the pressure in your arteries as the heart relaxes. What are the causes? The cause of this condition is not known. What increases the risk? Some risk factors for high blood pressure are under your control. Others are not. Factors you can change  Smoking.  Having type 2 diabetes mellitus, high cholesterol, or both.  Not getting enough exercise or physical activity.  Being overweight.  Having too much fat, sugar, calories, or salt (sodium) in your diet.  Drinking too much alcohol. Factors that are difficult or impossible to change  Having chronic kidney disease.  Having a family history of high blood pressure.  Age. Risk increases with age.  Race. You may be at higher risk if you are African-American.  Gender. Men are at higher risk than women  before age 24. After age 38, women are at higher risk than men.  Having obstructive sleep apnea.  Stress. What are the signs or symptoms? Extremely high blood pressure (hypertensive crisis) may cause:  Headache.  Anxiety.  Shortness of breath.  Nosebleed.  Nausea and vomiting.  Severe chest pain.  Jerky movements you cannot control (seizures).  How is this diagnosed? This condition is diagnosed by measuring your blood pressure while you are seated, with your arm resting on a surface. The cuff of the blood pressure monitor will be placed directly against the skin of your upper arm at the level of your heart. It should be measured at least twice using the same arm. Certain conditions can cause a difference in blood pressure between your right and left arms. Certain factors can cause blood pressure readings to be lower or higher than normal (elevated) for a short period of time:  When your blood pressure is higher when you are in a health care provider's office than when you are at home, this is called white coat hypertension. Most people with this condition do not need medicines.  When your blood pressure is higher at home than when you are in a health care provider's office, this is called masked hypertension. Most people with this condition may need medicines to control blood pressure.  If you have a high blood pressure reading during one visit or you have normal blood pressure with other risk factors:  You may be asked to return on a different day to have your blood pressure checked again.  You may be asked to  monitor your blood pressure at home for 1 week or longer.  If you are diagnosed with hypertension, you may have other blood or imaging tests to help your health care provider understand your overall risk for other conditions. How is this treated? This condition is treated by making healthy lifestyle changes, such as eating healthy foods, exercising more, and reducing your  alcohol intake. Your health care provider may prescribe medicine if lifestyle changes are not enough to get your blood pressure under control, and if:  Your systolic blood pressure is above 130.  Your diastolic blood pressure is above 80.  Your personal target blood pressure may vary depending on your medical conditions, your age, and other factors. Follow these instructions at home: Eating and drinking  Eat a diet that is high in fiber and potassium, and low in sodium, added sugar, and fat. An example eating plan is called the DASH (Dietary Approaches to Stop Hypertension) diet. To eat this way: ? Eat plenty of fresh fruits and vegetables. Try to fill half of your plate at each meal with fruits and vegetables. ? Eat whole grains, such as whole wheat pasta, brown rice, or whole grain bread. Fill about one quarter of your plate with whole grains. ? Eat or drink low-fat dairy products, such as skim milk or low-fat yogurt. ? Avoid fatty cuts of meat, processed or cured meats, and poultry with skin. Fill about one quarter of your plate with lean proteins, such as fish, chicken without skin, beans, eggs, and tofu. ? Avoid premade and processed foods. These tend to be higher in sodium, added sugar, and fat.  Reduce your daily sodium intake. Most people with hypertension should eat less than 1,500 mg of sodium a day.  Limit alcohol intake to no more than 1 drink a day for nonpregnant women and 2 drinks a day for men. One drink equals 12 oz of beer, 5 oz of wine, or 1 oz of hard liquor. Lifestyle  Work with your health care provider to maintain a healthy body weight or to lose weight. Ask what an ideal weight is for you.  Get at least 30 minutes of exercise that causes your heart to beat faster (aerobic exercise) most days of the week. Activities may include walking, swimming, or biking.  Include exercise to strengthen your muscles (resistance exercise), such as pilates or lifting weights, as part  of your weekly exercise routine. Try to do these types of exercises for 30 minutes at least 3 days a week.  Do not use any products that contain nicotine or tobacco, such as cigarettes and e-cigarettes. If you need help quitting, ask your health care provider.  Monitor your blood pressure at home as told by your health care provider.  Keep all follow-up visits as told by your health care provider. This is important. Medicines  Take over-the-counter and prescription medicines only as told by your health care provider. Follow directions carefully. Blood pressure medicines must be taken as prescribed.  Do not skip doses of blood pressure medicine. Doing this puts you at risk for problems and can make the medicine less effective.  Ask your health care provider about side effects or reactions to medicines that you should watch for. Contact a health care provider if:  You think you are having a reaction to a medicine you are taking.  You have headaches that keep coming back (recurring).  You feel dizzy.  You have swelling in your ankles.  You have trouble with your  vision. Get help right away if:  You develop a severe headache or confusion.  You have unusual weakness or numbness.  You feel faint.  You have severe pain in your chest or abdomen.  You vomit repeatedly.  You have trouble breathing. Summary  Hypertension is when the force of blood pumping through your arteries is too strong. If this condition is not controlled, it may put you at risk for serious complications.  Your personal target blood pressure may vary depending on your medical conditions, your age, and other factors. For most people, a normal blood pressure is less than 120/80.  Hypertension is treated with lifestyle changes, medicines, or a combination of both. Lifestyle changes include weight loss, eating a healthy, low-sodium diet, exercising more, and limiting alcohol. This information is not intended to  replace advice given to you by your health care provider. Make sure you discuss any questions you have with your health care provider. Document Released: 12/16/2005 Document Revised: 11/13/2016 Document Reviewed: 11/13/2016 Elsevier Interactive Patient Education  [employer].

## 2018-09-21 NOTE — Assessment & Plan Note (Signed)
New diagnosis Blood pressure has been elevated on several occasions Encouraged him to start monitoring blood pressure at home Start lisinopril 10 mg daily-discussed possible side effect of dry cough He will follow-up in 4 weeks Blood work at that time Stressed low-sodium diet, regular exercise and weight loss
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
## 2018-09-21 NOTE — Assessment & Plan Note (Signed)
Taking gabapentin daily and tizanidine only as needed Headaches are controlled

## 2018-09-21 NOTE — Assessment & Plan Note (Signed)
He has some perceived unsteadiness typically only with going up and down stairs and getting up and down off the floor-he describes it more of a insecurity No issues with walking Not currently exercising-encouraged regular exercise His father does have Parkinson's and his fear is this is a precursor to that, but no other symptoms and unlikely related to Parkinson's Deferred neurology referral Will monitor for now

## 2018-09-21 NOTE — Assessment & Plan Note (Signed)
Range of motion of eyes and strength of eyelids intact-drooping is likely more age-related Advised seeing his eye doctor for further evaluation and consideration of treatment

## 2018-10-01 ENCOUNTER — Ambulatory Visit: Payer: 59 | Admitting: Family Medicine

## 2018-10-12 NOTE — Progress Notes (Signed)
[facility] [address], [place] [address] Phone: [phone] Subjective:    I Kandace Blitz am serving as a Education administrator for Dr. Hulan Saas.   I'm seeing this patient by the request  of:   Binnie Rail, MD   CC: Left shoulder pain  [id number]  SHEMUEL HARKLEROAD is a 62 y.o. male coming in with complaint of left shoulder pain. No neck pain noted. No numbness and tingling.   Onset- 6 months Location- Deltoid Character- Sharp Aggravating factors- Abduction Reliving factors- Tylenol, Ice  Severity-6 out of 10 in severity     Past Medical History:  Diagnosis Date  . Allergy   . Anxiety   . Asthma    severe, Dr. Linna Darner  . Blood clots in brain    history of   . Brain tumor ([place]) 1993  . Depression    situational  . GERD (gastroesophageal reflux disease)    severe  . Headache(784.0)   . Hyperlipidemia   . Orchalgia   . Pneumonia    hx of  . Prostate cancer ([facility]) 07/01/13   gleason 6  . Thyroid disease    hypothyroidism   Past Surgical History:  Procedure Laterality Date  . brain shunt  1993   put in, removed, put in again  . LYMPHADENECTOMY Bilateral 10/22/2013   Procedure: LYMPHADENECTOMY;  Surgeon: Alexis Frock, MD;  Location: [facility];  Service: Urology;  Laterality: Bilateral;  . 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 removed blood clot     X 2  . ROBOT ASSISTED LAPAROSCOPIC RADICAL PROSTATECTOMY N/A 10/22/2013   Procedure: ROBOTIC ASSISTED LAPAROSCOPIC RADICAL PROSTATECTOMY   PRE- PERITONEAL APPROACH;  Surgeon: Alexis Frock, MD;  Location: [facility];  Service: Urology;  Laterality: N/A;  3.5 HRS    Social History   Socioeconomic History  . Marital status: Married    Spouse name: Not on file  . Number of children: Not on file  . Years of education: Not on file  . Highest education level: Not on file  Occupational History  . Not on file  Social Needs  . Financial resource strain: Not on file  . Food  insecurity:    Worry: Not on file    Inability: Not on file  . Transportation needs:    Medical: Not on file    Non-medical: Not on file  Tobacco Use  . Smoking status: Never Smoker  . Smokeless tobacco: Never Used  Substance and Sexual Activity  . Alcohol use: Yes    Comment: Rarely  . Drug use: No  . Sexual activity: Not on file  Lifestyle  . Physical activity:    Days per week: Not on file    Minutes per session: Not on file  . Stress: Not on file  Relationships  . Social connections:    Talks on phone: Not on file    Gets together: Not on file    Attends religious service: Not on file    Active member of club or organization: Not on file    Attends meetings of clubs or organizations: Not on file    Relationship status: Not on file  Other Topics Concern  . Not on file  Social History Narrative   Exercise: none   Allergies  Allergen Reactions  . Chocolate   . Cola   . Dust Mite Extract   . Erythromycin Other (See Comments)    Pt  unsure of what the reaction was. Happen as a child.    Family History  Problem Relation Age of Onset  . Cancer Brother        anal cancer  . Asthma Maternal Uncle   . Cancer Father        prostate  cancer  . Testicular cancer Father   . Coronary artery disease Mother 38       5 vessel CABG  . Colon cancer Neg Hx   . Stomach cancer Neg Hx     Current Outpatient Medications (Endocrine & Metabolic):  .  SYNTHROID 100 MCG tablet,   Current Outpatient Medications (Cardiovascular):  .  ezetimibe-simvastatin (VYTORIN) 10-40 MG per tablet, Take 1 tablet by mouth at bedtime.   Marland Kitchen  lisinopril (PRINIVIL,ZESTRIL) 10 MG tablet, Take 1 tablet (10 mg total) by mouth daily.  Current Outpatient Medications (Respiratory):  Marland Kitchen  ADVAIR DISKUS 250-50 MCG/DOSE AEPB, USE 1 PUFF EVERY 12 HOURS. Marland Kitchen  albuterol (PROAIR HFA) 108 (90 Base) MCG/ACT inhaler, USE @ PUFFS EVERY 6 HOURS AS NEEDED FOR WHEEZING .  montelukast (SINGULAIR) 10 MG tablet, Take 1 tablet  (10 mg total) by mouth at bedtime. -- Office visit needed for further refills    Current Outpatient Medications (Other):  .  gabapentin (NEURONTIN) 300 MG capsule, Take 300 mg by mouth 3 (three) times daily.   .  pantoprazole (PROTONIX) 40 MG tablet, Take 40 mg by mouth daily.   Marland Kitchen  tiZANidine (ZANAFLEX) 4 MG tablet, Take 4 mg by mouth every 6 (six) hours as needed for muscle spasms.    Past medical history, social, surgical and family history all reviewed in electronic medical record.  No pertanent information unless stated regarding to the chief complaint.   Review of Systems:  No headache, visual changes, nausea, vomiting, diarrhea, constipation, dizziness, abdominal pain, skin rash, fevers, chills, night sweats, weight loss, swollen lymph nodes, body aches, joint swelling,  chest pain, shortness of breath, mood changes.  Positive muscle aches  Objective  Blood pressure (!) 150/92, pulse (!) 56, height 5\' 5"  (1.651 m), weight 177 lb (80.3 kg), SpO2 98 %.   General: No apparent distress alert and oriented x3 mood and affect normal, dressed appropriately.  HEENT: Pupils equal, extraocular movements intact  Respiratory: Patient's speak in full sentences and does not appear short of breath  Cardiovascular: No lower extremity edema, non tender, no erythema  Skin: Warm dry intact with no signs of infection or rash on extremities or on axial skeleton.  Abdomen: Soft nontender  Neuro: Cranial nerves II through XII are intact, neurovascularly intact in all extremities with 2+ DTRs and 2+ pulses.  Lymph: No lymphadenopathy of posterior or anterior cervical chain or axillae bilaterally.  Gait normal with good balance and coordination.  MSK:  Non tender with full range of motion and good stability and symmetric strength and tone of  elbows, wrist, hip, knee and ankles bilaterally.  Shoulder: left Inspection reveals no abnormalities, atrophy or asymmetry. Palpation is normal with no tenderness  over AC joint or bicipital groove. ROM is full in all planes passively. Rotator cuff strength normal throughout. signs of impingement with positive Neer and Hawkin's tests, but negative empty can sign. Speeds and Yergason's tests normal. Positive O'Brien's positive crossover Normal scapular function observed. No painful arc and no drop arm sign. No apprehension sign Contralateral shoulder unremarkable  MSK US performed of: left This study was ordered, performed, and interpreted by Charlann Boxer D.O.  Shoulder:  Supraspinatus: Hypoechoic changes within the tendon sheath but no true tear appreciated bursal bulge seen with shoulder abduction on impingement view. Infraspinatus:  Appears normal on long and transverse views. Significant increase in Doppler flow Subscapularis:  Appears normal on long and transverse views. Positive bursa  AC joint: Mild arthritis Glenohumeral Joint:  Appears normal without effusion. Glenoid Labrum:  Intact without visualized tears. Biceps Tendon: Trace effusion noted  Impression: Subacromial bursitis mild tendinitis     [id number]; 15 additional minutes spent for Therapeutic exercises as stated in above notes.  This included exercises focusing on stretching, strengthening, with significant focus on eccentric aspects.   Long term goals include an improvement in range of motion, strength, endurance as well as avoiding reinjury. Patient's frequency would include in 1-2 times a day, 3-5 times a week for a duration of 6-12 weeks. Shoulder Exercises that included:  Basic scapular stabilization to include adduction and depression of scapula Scaption, focusing on proper movement and good control Internal and External rotation utilizing a theraband, with elbow tucked at side entire time Rows with theraband which was givne   Proper technique shown and discussed handout in great detail with ATC.  All questions were discussed and answered.   Impression and Recommendations:      This case required medical decision making of moderate complexity. The above documentation has been reviewed and is accurate and complete Lyndal Pulley, DO       Note: This dictation was prepared with Dragon dictation along with smaller phrase technology. Any transcriptional errors that result from this process are unintentional.

## 2018-10-13 ENCOUNTER — Ambulatory Visit: Payer: 59 | Admitting: Family Medicine

## 2018-10-13 ENCOUNTER — Encounter: Payer: Self-pay | Admitting: Family Medicine

## 2018-10-13 ENCOUNTER — Ambulatory Visit: Payer: Self-pay

## 2018-10-13 VITALS — BP 150/92 | HR 56 | Ht 65.0 in | Wt 177.0 lb

## 2018-10-13 DIAGNOSIS — M25512 Pain in left shoulder: Secondary | ICD-10-CM

## 2018-10-13 DIAGNOSIS — G8929 Other chronic pain: Secondary | ICD-10-CM | POA: Diagnosis not present

## 2018-10-13 DIAGNOSIS — M7582 Other shoulder lesions, left shoulder: Secondary | ICD-10-CM

## 2018-10-13 DIAGNOSIS — M778 Other enthesopathies, not elsewhere classified: Secondary | ICD-10-CM

## 2018-10-13 NOTE — Assessment & Plan Note (Signed)
Rotator cuff tendinitis.  Discussed icing regimen and home exercises.  Topical anti-inflammatories given, discussed ergonomics.  Discussed which activities to do which wants to avoid.  Increase activity slowly over the course the next several days.  Follow-up again in 4 to 8 weeks

## 2018-10-13 NOTE — Patient Instructions (Signed)
Good to see you.  Ice 20 minutes 2 times daily. Usually after activity and before bed. Exercises 3 times a week.  pennsaid pinkie amount topically 2 times daily as needed.  Stay active Keep hands within peripheral vision with all activity  See me again in 4 weeks nd make sure you are doing much better!

## 2018-10-22 NOTE — Progress Notes (Signed)
Subjective:    Patient ID: Adam Sullivan, male    DOB: [birth year], 62 y.o.   MRN: [id number]  HPI He is here for a physical exam.   We started him on a blood pressure medication and his last visit.  He is taking it daily.  He denies side effects.  He is not monitoring his blood pressure.  He states he has been trying a little better and has been more active.  He is active at work and walks a lot, but he has been taking the stairs more and has lost a little weight.  He has no concerns.  Medications and allergies reviewed with patient and updated if appropriate.  Patient Active Problem List   Diagnosis Date Noted  . Left shoulder tendinitis 10/13/2018  . Left shoulder pain 09/21/2018  . Unsteady 09/21/2018  . Poor short-term memory 09/21/2018  . Drooping eyelid disease, left 09/21/2018  . Strain of groin, left, initial encounter 05/01/2017  . History of prostate cancer 09/23/2016  . Essential hypertension 09/23/2016  . Hyperglycemia 05/20/2014  . Brain tumor (benign) ([facility]) 06/04/2011  . GERD (gastroesophageal reflux disease) 06/04/2011  . Dyslipidemia 06/04/2011  . Headache disorder 06/04/2011  . Hypothyroidism 03/18/2008  . Asthma 03/18/2008    Current Outpatient Medications on File Prior to Visit  Medication Sig Dispense Refill  . ADVAIR DISKUS 250-50 MCG/DOSE AEPB USE 1 PUFF EVERY 12 HOURS. 60 each 0  . albuterol (PROAIR HFA) 108 (90 Base) MCG/ACT inhaler USE @ PUFFS EVERY 6 HOURS AS NEEDED FOR WHEEZING 8.5 g 5  . ezetimibe-simvastatin (VYTORIN) 10-40 MG per tablet Take 1 tablet by mouth at bedtime.      . gabapentin (NEURONTIN) 300 MG capsule Take 300 mg by mouth 3 (three) times daily.      Marland Kitchen lisinopril (PRINIVIL,ZESTRIL) 10 MG tablet Take 1 tablet (10 mg total) by mouth daily. 90 tablet 3  . montelukast (SINGULAIR) 10 MG tablet Take 1 tablet (10 mg total) by mouth at bedtime. -- Office visit needed for further refills 30 tablet 0  . pantoprazole (PROTONIX) 40 MG tablet  Take 40 mg by mouth daily.      Marland Kitchen SYNTHROID 100 MCG tablet     . tiZANidine (ZANAFLEX) 4 MG tablet Take 4 mg by mouth every 6 (six) hours as needed for muscle spasms.     No current facility-administered medications on file prior to visit.     Past Medical History:  Diagnosis Date  . Allergy   . Anxiety   . Asthma    severe, Dr. Linna Darner  . Blood clots in brain    history of   . Brain tumor ([facility]) 1993  . Depression    situational  . GERD (gastroesophageal reflux disease)    severe  . Headache(784.0)   . Hyperlipidemia   . Orchalgia   . Pneumonia    hx of  . Prostate cancer ([facility]) 07/01/13   gleason 6  . Thyroid disease    hypothyroidism    Past Surgical History:  Procedure Laterality Date  . brain shunt  1993   put in, removed, put in again  . LYMPHADENECTOMY Bilateral 10/22/2013   Procedure: LYMPHADENECTOMY;  Surgeon: Alexis Frock, MD;  Location: [facility];  Service: Urology;  Laterality: Bilateral;  . 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 removed blood clot     X 2  . ROBOT ASSISTED LAPAROSCOPIC RADICAL PROSTATECTOMY N/A 10/22/2013   Procedure: ROBOTIC  ASSISTED LAPAROSCOPIC RADICAL PROSTATECTOMY   PRE- PERITONEAL APPROACH;  Surgeon: Alexis Frock, MD;  Location: [facility];  Service: Urology;  Laterality: N/A;  3.5 HRS     Social History   Socioeconomic History  . Marital status: Married    Spouse name: Not on file  . Number of children: Not on file  . Years of education: Not on file  . Highest education level: Not on file  Occupational History  . Not on file  Social Needs  . Financial resource strain: Not on file  . Food insecurity:    Worry: Not on file    Inability: Not on file  . Transportation needs:    Medical: Not on file    Non-medical: Not on file  Tobacco Use  . Smoking status: Never Smoker  . Smokeless tobacco: Never Used  Substance and Sexual Activity  . Alcohol use: Yes    Comment: Rarely  . Drug use: No  . Sexual activity:  Not on file  Lifestyle  . Physical activity:    Days per week: Not on file    Minutes per session: Not on file  . Stress: Not on file  Relationships  . Social connections:    Talks on phone: Not on file    Gets together: Not on file    Attends religious service: Not on file    Active member of club or organization: Not on file    Attends meetings of clubs or organizations: Not on file    Relationship status: Not on file  Other Topics Concern  . Not on file  Social History Narrative   Exercise: none    Family History  Problem Relation Age of Onset  . Cancer Brother        anal cancer  . Asthma Maternal Uncle   . Cancer Father        prostate  cancer  . Testicular cancer Father   . Coronary artery disease Mother 56       5 vessel CABG  . Colon cancer Neg Hx   . Stomach cancer Neg Hx     Review of Systems  Constitutional: Negative for chills and fever.  Eyes: Negative for visual disturbance.  Respiratory: Negative for cough, shortness of breath and wheezing.   Cardiovascular: Negative for chest pain, palpitations and leg swelling.  Gastrointestinal: Negative for abdominal pain, blood in stool, constipation, diarrhea and nausea.       Rare gerd  - controlled with protonix  Genitourinary: Negative for difficulty urinating, dysuria and hematuria.  Musculoskeletal: Positive for arthralgias (left shoulder).  Skin: Negative for color change and rash.  Neurological: Positive for light-headedness and headaches (occasional).  Psychiatric/Behavioral: Positive for dysphoric mood (mild, controlling it - does not want medication ). The patient is not nervous/anxious.        Objective:   Vitals:   10/23/18 0859  BP: (!) 142/84  Pulse: (!) 58  Resp: 16  Temp: 98.6 F (37 C)  SpO2: 97%   Filed Weights   10/23/18 0859  Weight: 173 lb 12.8 oz (78.8 kg)   Body mass index is 28.92 kg/m.  BP Readings from Last 3 Encounters:  10/23/18 (!) 142/84  10/13/18 (!) 150/92    09/21/18 (!) 178/100    Wt Readings from Last 3 Encounters:  10/23/18 173 lb 12.8 oz (78.8 kg)  10/13/18 177 lb (80.3 kg)  09/21/18 176 lb (79.8 kg)     Physical Exam Constitutional: He appears well-developed  and well-nourished. No distress.  HENT:  Head: Normocephalic and atraumatic.  Right Ear: External ear normal.  Left Ear: External ear normal.  Mouth/Throat: Oropharynx is clear and moist.  Normal ear canals and TM b/l  Eyes: Conjunctivae and EOM are normal.  Neck: Neck supple. No tracheal deviation present. No thyromegaly present.  No carotid bruit  Cardiovascular: Normal rate, regular rhythm, normal heart sounds and intact distal pulses.   No murmur heard. Pulmonary/Chest: Effort normal and breath sounds normal. No respiratory distress. He has no wheezes. He has no rales.  Abdominal: Soft. He exhibits no distension. There is no tenderness.  Genitourinary: deferred  Musculoskeletal: He exhibits no edema.  Lymphadenopathy:   He has no cervical adenopathy.  Skin: Skin is warm and dry. He is not diaphoretic.  Psychiatric: He has a normal mood and affect. His behavior is normal.         Assessment & Plan:   Physical exam: Screening blood work ordered Immunizations flu vaccine up-to-date, discussed shingles vaccine - denies ever having chickenpox - will check titers, deferred pneumonia vaccine Colonoscopy   up-to-date Eye exams  Not up to date - recommended eye exam EKG   done 2014 Exercise  Very active at work, taking stairs, no other exercise-stressed continuing increased activity Weight  Lost some weight - has cut back on processed food-continue weight loss efforts Skin  No concerns Substance abuse   none   See Problem List for Assessment and Plan of chronic medical problems.  Follow-up in 6 months

## 2018-10-22 NOTE — Patient Instructions (Addendum)
Your BP should be less than 140/90.  Check your BP if possible.   Make an eye appointment.     Tests ordered today. Your results will be released to [facility] (or called to you) after review, usually within 72hours after test completion. If any changes need to be made, you will be notified at that same time.  All other Health Maintenance issues reviewed.   All recommended immunizations and age-appropriate screenings are up-to-date or discussed.  No immunizations administered today.   Medications reviewed and updated.  Changes include :   none   Please followup in 6 months   Health Maintenance, Male A healthy lifestyle and preventive care is important for your health and wellness. Ask your health care provider about what schedule of regular examinations is right for you. What should I know about weight and diet? Eat a Healthy Diet  Eat plenty of vegetables, fruits, whole grains, low-fat dairy products, and lean protein.  Do not eat a lot of foods high in solid fats, added sugars, or salt.  Maintain a Healthy Weight Regular exercise can help you achieve or maintain a healthy weight. You should:  Do at least 150 minutes of exercise each week. The exercise should increase your heart rate and make you sweat (moderate-intensity exercise).  Do strength-training exercises at least twice a week.  Watch Your Levels of Cholesterol and Blood Lipids  Have your blood tested for lipids and cholesterol every 5 years starting at 60 years of age. If you are at high risk for heart disease, you should start having your blood tested when you are 60 years old. You may need to have your cholesterol levels checked more often if: ? Your lipid or cholesterol levels are high. ? You are older than 60 years of age. ? You are at high risk for heart disease.  What should I know about cancer screening? Many types of cancers can be detected early and may often be prevented. Lung Cancer  You should be  screened every year for lung cancer if: ? You are a current smoker who has smoked for at least 30 years. ? You are a former smoker who has quit within the past 15 years.  Talk to your health care provider about your screening options, when you should start screening, and how often you should be screened.  Colorectal Cancer  Routine colorectal cancer screening usually begins at 60 years of age and should be repeated every 5-10 years until you are 60 years old. You may need to be screened more often if early forms of precancerous polyps or small growths are found. Your health care provider may recommend screening at an earlier age if you have risk factors for colon cancer.  Your health care provider may recommend using home test kits to check for hidden blood in the stool.  A small camera at the end of a tube can be used to examine your colon (sigmoidoscopy or colonoscopy). This checks for the earliest forms of colorectal cancer.  Prostate and Testicular Cancer  Depending on your age and overall health, your health care provider may do certain tests to screen for prostate and testicular cancer.  Talk to your health care provider about any symptoms or concerns you have about testicular or prostate cancer.  Skin Cancer  Check your skin from head to toe regularly.  Tell your health care provider about any new moles or changes in moles, especially if: ? There is a change in a mole's size,  shape, or color. ? You have a mole that is larger than a pencil eraser.  Always use sunscreen. Apply sunscreen liberally and repeat throughout the day.  Protect yourself by wearing long sleeves, pants, a wide-brimmed hat, and sunglasses when outside.  What should I know about heart disease, diabetes, and high blood pressure?  If you are 23-56 years of age, have your blood pressure checked every 3-5 years. If you are 67 years of age or older, have your blood pressure checked every year. You should have  your blood pressure measured twice-once when you are at a hospital or clinic, and once when you are not at a hospital or clinic. Record the average of the two measurements. To check your blood pressure when you are not at a hospital or clinic, you can use: ? An automated blood pressure machine at a pharmacy. ? A home blood pressure monitor.  Talk to your health care provider about your target blood pressure.  If you are between 17-108 years old, ask your health care provider if you should take aspirin to prevent heart disease.  Have regular diabetes screenings by checking your fasting blood sugar level. ? If you are at a normal weight and have a low risk for diabetes, have this test once every three years after the age of 17. ? If you are overweight and have a high risk for diabetes, consider being tested at a younger age or more often.  A one-time screening for abdominal aortic aneurysm (AAA) by ultrasound is recommended for men aged 62-75 years who are current or former smokers. What should I know about preventing infection? Hepatitis B If you have a higher risk for hepatitis B, you should be screened for this virus. Talk with your health care provider to find out if you are at risk for hepatitis B infection. Hepatitis C Blood testing is recommended for:  Everyone born from [birth year] through 1963.  Anyone with known risk factors for hepatitis C.  Sexually Transmitted Diseases (STDs)  You should be screened each year for STDs including gonorrhea and chlamydia if: ? You are sexually active and are younger than 60 years of age. ? You are older than 60 years of age and your health care provider tells you that you are at risk for this type of infection. ? Your sexual activity has changed since you were last screened and you are at an increased risk for chlamydia or gonorrhea. Ask your health care provider if you are at risk.  Talk with your health care provider about whether you are at high risk  of being infected with HIV. Your health care provider may recommend a prescription medicine to help prevent HIV infection.  What else can I do?  Schedule regular health, dental, and eye exams.  Stay current with your vaccines (immunizations).  Do not use any tobacco products, such as cigarettes, chewing tobacco, and e-cigarettes. If you need help quitting, ask your health care provider.  Limit alcohol intake to no more than 2 drinks per day. One drink equals 12 ounces of beer, 5 ounces of wine, or 1 ounces of hard liquor.  Do not use street drugs.  Do not share needles.  Ask your health care provider for help if you need support or information about quitting drugs.  Tell your health care provider if you often feel depressed.  Tell your health care provider if you have ever been abused or do not feel safe at home. This information is not intended  to replace advice given to you by your health care provider. Make sure you discuss any questions you have with your health care provider. Document Released: 06/13/2008 Document Revised: 08/14/2016 Document Reviewed: 09/19/2015 Elsevier Interactive Patient Education  [employer].

## 2018-10-23 ENCOUNTER — Encounter: Payer: Self-pay | Admitting: Internal Medicine

## 2018-10-23 ENCOUNTER — Ambulatory Visit (INDEPENDENT_AMBULATORY_CARE_PROVIDER_SITE_OTHER): Payer: 59 | Admitting: Internal Medicine

## 2018-10-23 ENCOUNTER — Other Ambulatory Visit (INDEPENDENT_AMBULATORY_CARE_PROVIDER_SITE_OTHER): Payer: 59

## 2018-10-23 VITALS — BP 142/84 | HR 58 | Temp 98.6°F | Resp 16 | Ht 65.0 in | Wt 173.8 lb

## 2018-10-23 DIAGNOSIS — D332 Benign neoplasm of brain, unspecified: Secondary | ICD-10-CM

## 2018-10-23 DIAGNOSIS — K219 Gastro-esophageal reflux disease without esophagitis: Secondary | ICD-10-CM

## 2018-10-23 DIAGNOSIS — E039 Hypothyroidism, unspecified: Secondary | ICD-10-CM

## 2018-10-23 DIAGNOSIS — R739 Hyperglycemia, unspecified: Secondary | ICD-10-CM

## 2018-10-23 DIAGNOSIS — I10 Essential (primary) hypertension: Secondary | ICD-10-CM

## 2018-10-23 DIAGNOSIS — Z0000 Encounter for general adult medical examination without abnormal findings: Secondary | ICD-10-CM

## 2018-10-23 DIAGNOSIS — E785 Hyperlipidemia, unspecified: Secondary | ICD-10-CM

## 2018-10-23 DIAGNOSIS — R519 Headache, unspecified: Secondary | ICD-10-CM

## 2018-10-23 DIAGNOSIS — J45909 Unspecified asthma, uncomplicated: Secondary | ICD-10-CM | POA: Diagnosis not present

## 2018-10-23 DIAGNOSIS — Z8546 Personal history of malignant neoplasm of prostate: Secondary | ICD-10-CM

## 2018-10-23 DIAGNOSIS — R51 Headache: Secondary | ICD-10-CM

## 2018-10-23 LAB — COMPREHENSIVE METABOLIC PANEL
ALK PHOS: 40 U/L (ref 39–117)
ALT: 11 U/L (ref 0–53)
AST: 13 U/L (ref 0–37)
Albumin: 4.2 g/dL (ref 3.5–5.2)
BUN: 12 mg/dL (ref 6–23)
CO2: 30 mEq/L (ref 19–32)
Calcium: 9.1 mg/dL (ref 8.4–10.5)
Chloride: 96 mEq/L (ref 96–112)
Creatinine, Ser: 0.82 mg/dL (ref 0.40–1.50)
GFR: 101.76 mL/min (ref >60.00)
Glucose, Bld: 87 mg/dL (ref 70–99)
POTASSIUM: 3.6 meq/L (ref 3.5–5.1)
Sodium: 133 mEq/L — ABNORMAL LOW (ref 135–145)
TOTAL PROTEIN: 7.1 g/dL (ref 6.0–8.3)
Total Bilirubin: 0.5 mg/dL (ref 0.2–1.2)

## 2018-10-23 LAB — HEMOGLOBIN A1C: HEMOGLOBIN A1C: 5.8 % (ref 4.6–6.5)

## 2018-10-23 LAB — CBC WITH DIFFERENTIAL/PLATELET
Basophils Absolute: 0 10*3/uL (ref 0.0–0.1)
Basophils Relative: 0.9 % (ref 0.0–3.0)
EOS PCT: 8 % — AB (ref 0.0–5.0)
Eosinophils Absolute: 0.4 10*3/uL (ref 0.0–0.7)
HEMATOCRIT: 38.5 % — AB (ref 39.0–52.0)
HEMOGLOBIN: 13.4 g/dL (ref 13.0–17.0)
LYMPHS PCT: 28.5 % (ref 12.0–46.0)
Lymphs Abs: 1.5 10*3/uL (ref 0.7–4.0)
MCHC: 34.9 g/dL (ref 30.0–36.0)
MCV: 88 fl (ref 78.0–100.0)
Monocytes Absolute: 0.5 10*3/uL (ref 0.1–1.0)
Monocytes Relative: 9.7 % (ref 3.0–12.0)
Neutro Abs: 2.7 10*3/uL (ref 1.4–7.7)
Neutrophils Relative %: 52.9 % (ref 43.0–77.0)
Platelets: 271 10*3/uL (ref 150.0–400.0)
RBC: 4.37 Mil/uL (ref 4.22–5.81)
RDW: 14.3 % (ref 11.5–15.5)
WBC: 5.2 10*3/uL (ref 4.0–10.5)

## 2018-10-23 LAB — LIPID PANEL
Cholesterol: 116 mg/dL (ref 0–200)
HDL: 37.6 mg/dL — ABNORMAL LOW (ref >39.00)
LDL CALC: 58 mg/dL (ref 0–99)
NONHDL: 78.35
Total CHOL/HDL Ratio: 3
Triglycerides: 104 mg/dL (ref 0.0–149.0)
VLDL: 20.8 mg/dL (ref 0.0–40.0)

## 2018-10-23 NOTE — Assessment & Plan Note (Signed)
History of benign brain tumor s/p surgery years ago  Shunt in place

## 2018-10-23 NOTE — Assessment & Plan Note (Addendum)
Take gabapentin daily for prevention Takes tizanidine prn - maybe 1-2/ month Overall controlled, continue above

## 2018-10-23 NOTE — Assessment & Plan Note (Signed)
Will check lipid panel, CMP Medication has been prescribed by his endocrinologist

## 2018-10-23 NOTE — Assessment & Plan Note (Signed)
Asthma overall controlled Asthma is persistent and he takes Advair twice daily Albuterol as needed-may be once a week at most, but has not taken it in a while Also taking Singulair, continue

## 2018-10-23 NOTE — Assessment & Plan Note (Signed)
Blood pressure much better and only minimally elevated Encouraged him to start monitoring his blood pressure so we have an idea if his blood pressure is controlled Continue lisinopril 10 mg daily CMP

## 2018-10-23 NOTE — Assessment & Plan Note (Signed)
GERD controlled Continue daily medication  

## 2018-10-23 NOTE — Assessment & Plan Note (Signed)
No longer following with urology Deferred having psa

## 2018-10-23 NOTE — Assessment & Plan Note (Signed)
Seeing Dr Forde Dandy Management per Dr Forde Dandy

## 2018-10-23 NOTE — Assessment & Plan Note (Signed)
Check a1c Low sugar / carb diet Stressed regular exercise   

## 2018-10-26 LAB — VARICELLA ZOSTER ANTIBODY, IGG

## 2018-10-27 ENCOUNTER — Encounter: Payer: Self-pay | Admitting: Internal Medicine

## 2018-11-10 ENCOUNTER — Ambulatory Visit: Payer: 59 | Admitting: Family Medicine

## 2018-11-24 ENCOUNTER — Ambulatory Visit: Payer: 59 | Admitting: Family Medicine

## 2019-04-06 ENCOUNTER — Other Ambulatory Visit: Payer: Self-pay

## 2019-04-06 ENCOUNTER — Ambulatory Visit: Payer: Self-pay | Admitting: *Deleted

## 2019-04-06 ENCOUNTER — Emergency Department (HOSPITAL_COMMUNITY)
Admission: EM | Admit: 2019-04-06 | Discharge: 2019-04-06 | Disposition: A | Discharge status: Home / Self Care | Payer: 59 | Attending: Emergency Medicine | Admitting: Emergency Medicine

## 2019-04-06 ENCOUNTER — Encounter (HOSPITAL_COMMUNITY): Payer: Self-pay | Admitting: Emergency Medicine

## 2019-04-06 ENCOUNTER — Emergency Department (HOSPITAL_COMMUNITY): Payer: 59

## 2019-04-06 DIAGNOSIS — Z79899 Other long term (current) drug therapy: Secondary | ICD-10-CM | POA: Insufficient documentation

## 2019-04-06 DIAGNOSIS — I10 Essential (primary) hypertension: Secondary | ICD-10-CM | POA: Diagnosis not present

## 2019-04-06 DIAGNOSIS — R202 Paresthesia of skin: Secondary | ICD-10-CM | POA: Diagnosis not present

## 2019-04-06 DIAGNOSIS — E039 Hypothyroidism, unspecified: Secondary | ICD-10-CM | POA: Insufficient documentation

## 2019-04-06 DIAGNOSIS — J45909 Unspecified asthma, uncomplicated: Secondary | ICD-10-CM | POA: Insufficient documentation

## 2019-04-06 DIAGNOSIS — R200 Anesthesia of skin: Secondary | ICD-10-CM | POA: Diagnosis not present

## 2019-04-06 NOTE — Telephone Encounter (Signed)
Pt reports sudden right sided numbness. Right arm, leg, foot, facial numbness, "Especially lips.  States onset last night, 2100. States "Went to bed, woke up and still there." States mild headache at HS, tylenol effective. H/O HTN, does not check at home, unsure of what BP has been running. Denies back, neck pain. Speech clear during call, denies facial droop. States has ROM right side but "Awkward." Pt directed to ED. Care advise given per protocol. States wife will drive.   Reason for Disposition . [1] Numbness (i.e., loss of sensation) of the face, arm / hand, or leg / foot on one side of the body AND [2] sudden onset AND [3] brief (now gone)    Present  Answer Assessment - Initial Assessment Questions 1. SYMPTOM: "What is the main symptom you are concerned about?" (e.g., weakness, numbness)     Numbness right arm, leg, face, lips 2. ONSET: "When did this start?" (minutes, hours, days; while sleeping)     9 or 10 last night 3. LAST NORMAL: "When was the last time you were normal (no symptoms)?"     2100 last night 4. PATTERN "Does this come and go, or has it been constant since it started?"  "Is it present now?"     constant 5. CARDIAC SYMPTOMS: "Have you had any of the following symptoms: chest pain, difficulty breathing, palpitations?"    no 6. NEUROLOGIC SYMPTOMS: "Have you had any of the following symptoms: headache, dizziness, vision loss, double vision, changes in speech, unsteady on your feet?"     Mild headache this am 7. OTHER SYMPTOMS: "Do you have any other symptoms?"    none  Protocols used: NEUROLOGIC DEFICIT-A-AH

## 2019-04-06 NOTE — ED Triage Notes (Signed)
Patient reports tingling to right foot since last night at approximately after "having leg crossed." Reports he then noticed tingling to right hand and right side of lips. Denies headache and blurred vision. States tingling has "decreased throughout the day."

## 2019-04-06 NOTE — ED Notes (Signed)
Patient transported to CT 

## 2019-04-06 NOTE — ED Notes (Signed)
EKG given to EDP,Floyd,MD., for review. 

## 2019-04-06 NOTE — ED Provider Notes (Addendum)
[facility] DEPT Provider Note   CSN: [id number] Arrival date & time: 04/06/19  1718    History   Chief Complaint Chief Complaint  Patient presents with  . Tingling    HPI Adam Sullivan is a 63 y.o. male.     63 year old male presents with numbness to his right foot as well as right hand which began yesterday.  States that his right foot symptoms started after he crosses legs for prolonged period of time.  That resolved and this morning when he awoke he noted some paresthesias to his right foot and right hand.  Possible tingling to his lip.  Mild headache but without visual changes.  Denies any gait abnormality.  No prior history of same.  No change to his speech.  Denies any confusion.  No history of same.  Symptoms have been persistent throughout the day.  Nothing makes them better or worse.     Past Medical History:  Diagnosis Date  . Allergy   . Anxiety   . Asthma    severe, Dr. Linna Darner  . Blood clots in brain    history of   . Brain tumor ([place]) 1993  . Depression    situational  . GERD (gastroesophageal reflux disease)    severe  . Headache(784.0)   . Hyperlipidemia   . Orchalgia   . Pneumonia    hx of  . Prostate cancer ([place]) 07/01/13   gleason 6  . Thyroid disease    hypothyroidism    Patient Active Problem List   Diagnosis Date Noted  . Left shoulder tendinitis 10/13/2018  . Unsteady 09/21/2018  . Poor short-term memory 09/21/2018  . Drooping eyelid disease, left 09/21/2018  . History of prostate cancer 09/23/2016  . Essential hypertension 09/23/2016  . Hyperglycemia 05/20/2014  . Brain tumor (benign) ([place]) 06/04/2011  . GERD (gastroesophageal reflux disease) 06/04/2011  . Dyslipidemia 06/04/2011  . Headache disorder 06/04/2011  . Hypothyroidism 03/18/2008  . Asthma 03/18/2008    Past Surgical History:  Procedure Laterality Date  . brain shunt  1993   put in, removed, put in again  . LYMPHADENECTOMY Bilateral  10/22/2013   Procedure: LYMPHADENECTOMY;  Surgeon: Alexis Frock, MD;  Location: [facility];  Service: Urology;  Laterality: Bilateral;  . 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 removed blood clot     X 2  . ROBOT ASSISTED LAPAROSCOPIC RADICAL PROSTATECTOMY N/A 10/22/2013   Procedure: ROBOTIC ASSISTED LAPAROSCOPIC RADICAL PROSTATECTOMY   PRE- PERITONEAL APPROACH;  Surgeon: Alexis Frock, MD;  Location: [facility];  Service: Urology;  Laterality: N/A;  3.5 HRS         Home Medications    Prior to Admission medications   Medication Sig Start Date End Date Taking? Authorizing Provider  ADVAIR DISKUS 250-50 MCG/DOSE AEPB USE 1 PUFF EVERY 12 HOURS. 03/05/16   Reynold Bowen, MD  albuterol [facility] HFA) 108 (90 Base) MCG/ACT inhaler USE @ PUFFS EVERY 6 HOURS AS NEEDED FOR WHEEZING 09/21/18   Burns, Claudina Lick, MD  ezetimibe-simvastatin (VYTORIN) 10-40 MG per tablet Take 1 tablet by mouth at bedtime.      [provider]  gabapentin (NEURONTIN) 300 MG capsule Take 300 mg by mouth 3 (three) times daily.      [provider]  lisinopril (PRINIVIL,ZESTRIL) 10 MG tablet Take 1 tablet (10 mg total) by mouth daily. 09/21/18   Binnie Rail, MD  montelukast (SINGULAIR) 10 MG tablet Take 1  tablet (10 mg total) by mouth at bedtime. -- Office visit needed for further refills 09/21/18   Binnie Rail, MD  pantoprazole (PROTONIX) 40 MG tablet Take 40 mg by mouth daily.      [provider]  SYNTHROID 100 MCG tablet  03/03/17   [provider]  tiZANidine (ZANAFLEX) 4 MG tablet Take 4 mg by mouth every 6 (six) hours as needed for muscle spasms.    [provider]    Family History Family History  Problem Relation Age of Onset  . Cancer Brother        anal cancer  . Asthma Maternal Uncle   . Cancer Father        prostate  cancer  . Testicular cancer Father   . Coronary artery disease Mother 17       5 vessel CABG  . Colon cancer Neg Hx   . Stomach cancer  Neg Hx     Social History Social History   Tobacco Use  . Smoking status: Never Smoker  . Smokeless tobacco: Never Used  Substance Use Topics  . Alcohol use: Yes    Comment: Rarely  . Drug use: No     Allergies   Chocolate; Cola; Dust mite extract; and Erythromycin   Review of Systems Review of Systems  All other systems reviewed and are negative.    Physical Exam Updated Vital Signs BP (!) 185/74 (BP Location: Left Arm)   Pulse 74   Temp 98.3 F (36.8 C) (Oral)   Resp 17   Ht 1.676 m (5\' 6" )   Wt 82.6 kg   SpO2 98%   BMI 29.38 kg/m   Physical Exam Vitals signs and nursing note reviewed.  Constitutional:      General: He is not in acute distress.    Appearance: Normal appearance. He is well-developed. He is not toxic-appearing.  HENT:     Head: Normocephalic and atraumatic.  Eyes:     General: Lids are normal.     Conjunctiva/sclera: Conjunctivae normal.     Pupils: Pupils are equal, round, and reactive to light.  Neck:     Musculoskeletal: Normal range of motion and neck supple.     Thyroid: No thyroid mass.     Trachea: No tracheal deviation.  Cardiovascular:     Rate and Rhythm: Normal rate and regular rhythm.     Heart sounds: Normal heart sounds. No murmur. No gallop.   Pulmonary:     Effort: Pulmonary effort is normal. No respiratory distress.     Breath sounds: Normal breath sounds. No stridor. No decreased breath sounds, wheezing, rhonchi or rales.  Abdominal:     General: Bowel sounds are normal. There is no distension.     Palpations: Abdomen is soft.     Tenderness: There is no abdominal tenderness. There is no rebound.  Musculoskeletal: Normal range of motion.        General: No tenderness.  Skin:    General: Skin is warm and dry.     Findings: No abrasion or rash.  Neurological:     Mental Status: He is alert and oriented to person, place, and time.     GCS: GCS eye subscore is 4. GCS verbal subscore is 5. GCS motor subscore is 6.      Cranial Nerves: No cranial nerve deficit, dysarthria or facial asymmetry.     Sensory: No sensory deficit.     Motor: No weakness or tremor.  Coordination: Finger-Nose-Finger Test normal.     Gait: Gait and tandem walk normal.     Comments: Strength is 5/5 throughout  Psychiatric:        Speech: Speech normal.        Behavior: Behavior normal.      ED Treatments / Results  Labs (all labs ordered are listed, but only abnormal results are displayed) Labs Reviewed - No data to display  EKG None  ED ECG REPORT   Date: 04/06/2019  Rate: 61  Rhythm: normal sinus rhythm  QRS Axis: normal  Intervals: normal  ST/T Wave abnormalities: normal  Conduction Disutrbances:none  Narrative Interpretation:   Old EKG Reviewed: none available  I have personally reviewed the EKG tracing and agree with the computerized printout as noted.  Radiology No results found.  Procedures Procedures (including critical care time)  Medications Ordered in ED Medications - No data to display   Initial Impression / Assessment and Plan / ED Course  I have reviewed the triage vital signs and the nursing notes.  Pertinent labs & imaging results that were available during my care of the patient were reviewed by me and considered in my medical decision making (see chart for details).        Patient without evidence of focal neurological finding at this time.  His symptoms are all subjective currently.  States that they have been present throughout the entire day and are getting better.  They have been localized to the top of his left foot as well as the tip of 1 of his fingers.  Do not think that this represents a TIA.  Head CT without acute findings.  No signs of ischemia to the patient's hand or foot.  EKG without evidence of A. fib.  Will give neurology referral   Final Clinical Impressions(s) / ED Diagnoses   Final diagnoses:  None    ED Discharge Orders    None       Lacretia Leigh, MD 04/06/19 Drema Halon    Lacretia Leigh, MD 04/06/19 [id number]

## 2019-04-06 NOTE — Telephone Encounter (Signed)
Noted. In ED now

## 2019-04-13 ENCOUNTER — Telehealth: Payer: Self-pay

## 2019-04-13 NOTE — Telephone Encounter (Signed)
I contacted the pt and left a vm for him to return my call to complete pre charting for vv scheduled for 04/14/19.

## 2019-04-14 ENCOUNTER — Encounter: Payer: Self-pay | Admitting: Neurology

## 2019-04-14 ENCOUNTER — Ambulatory Visit (INDEPENDENT_AMBULATORY_CARE_PROVIDER_SITE_OTHER): Payer: 59 | Admitting: Neurology

## 2019-04-14 ENCOUNTER — Other Ambulatory Visit: Payer: Self-pay

## 2019-04-14 DIAGNOSIS — E222 Syndrome of inappropriate secretion of antidiuretic hormone: Secondary | ICD-10-CM | POA: Diagnosis not present

## 2019-04-14 DIAGNOSIS — R202 Paresthesia of skin: Secondary | ICD-10-CM | POA: Diagnosis not present

## 2019-04-14 DIAGNOSIS — Z982 Presence of cerebrospinal fluid drainage device: Secondary | ICD-10-CM | POA: Diagnosis not present

## 2019-04-14 DIAGNOSIS — E230 Hypopituitarism: Secondary | ICD-10-CM | POA: Diagnosis not present

## 2019-04-14 NOTE — Progress Notes (Signed)
Virtual Visit via Video Note  I connected with Adam Sullivan on 04/14/19 at 10:00 AM EDT by a video enabled telemedicine application and verified that I am speaking with the correct person using two identifiers.   I discussed the limitations of evaluation and management by telemedicine and the availability of in person appointments. The patient expressed understanding and agreed to proceed.  History of Present Illness: Adam Sullivan is a 63 year old right-handed white male with a history of hypertension, dyslipidemia, and a history of migraine headache.  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  The patient has been seen by Dr. Earley Favor in the past for headaches, he has not required any active therapy for his headache in some time, he does take gabapentin 300 mg 3 times daily.  He may have 1 or 2 mild headaches a week, he has never had focal symptoms with this headache such as numbness or weakness or vision changes.  On 05 April 2019, he was watching TV, he had his right foot crossed over his left knee, when he got up to go to bed, he noticed that the right foot was numb, then he noticed that his right hand and right face around the mouth was numb and tingling.  He did go to bed at night, he woke up the next morning with persistence of symptoms on the right side and decided to go to the emergency room.  In the emergency room, CT scan of the brain was done and showed no acute changes, he was sent home from there.  He has had some improvement in symptoms but he still has some undulation of sensory symptoms on the right hand and right foot, he no longer has tingling around the right mouth.  He denies any clumsiness or weakness, he does have a some mild balance changes at least initially when he cannot feel his foot well.  He reported no confusion, vision changes, or dizziness.  He  denied any significant headache around the time of the onset of symptoms.  He denies chest pain or palpitations of the heart.  He is referred to this office for an evaluation.  His mother had a history of coronary artery disease and stroke.   Observations/Objective: On the WebEx evaluation, the patient is bright and alert, speech is well enunciated, not aphasic or dysarthric.  Extraocular movements are full with exception of some restriction of superior gaze and some divergence of gaze with exotropia of the right eye with superior gaze.  The patient has facial symmetry, he is able to protrude the tongue in the midline with good lateral movements of the tongue.  He has good finger-nose-finger and heel-to-shin bilaterally.  Gait is unremarkable, tandem gait is normal.  Romberg is negative.  No drift is seen.  Assessment and Plan: 1.  Right sided sensory deficit, possible stroke  2.  History of pineal tumor resection 1993  The patient does have some risk factors for stroke including dyslipidemia and hypertension, he does not smoke cigarettes.  The patient will be sent for MRI of the brain.  If this study does show evidence of a stroke, further evaluation to include carotid Doppler studies and 2D echocardiogram will be done.  The patient will go on low-dose aspirin 81 mg daily.  He will follow-up here if needed.  Follow Up Instructions: As  needed follow-up.   I discussed the assessment and treatment plan with the patient. The patient was provided an opportunity to ask questions and all were answered. The patient agreed with the plan and demonstrated an understanding of the instructions.   The patient was advised to call back or seek an in-person evaluation if the symptoms worsen or if the condition fails to improve as anticipated.  I provided 30 minutes of non-face-to-face time during this encounter.   Kathrynn Ducking, MD

## 2019-04-15 ENCOUNTER — Telehealth: Payer: Self-pay | Admitting: Neurology

## 2019-04-15 NOTE — Telephone Encounter (Signed)
[employer] Auth [place] via [facility] website order sent to GI. They will reach out to the pt to schedule.

## 2019-04-28 ENCOUNTER — Ambulatory Visit: Payer: 59 | Admitting: Internal Medicine

## 2019-04-29 ENCOUNTER — Other Ambulatory Visit: Payer: 59

## 2019-05-20 NOTE — Progress Notes (Signed)
Virtual Visit via Video Note  I connected with Adam Sullivan on 05/21/19 at  9:30 AM EDT by a video enabled telemedicine application and verified that I am speaking with the correct person using two identifiers.   I discussed the limitations of evaluation and management by telemedicine and the availability of in person appointments. The patient expressed understanding and agreed to proceed.  The patient is currently at home and I am in the office.    No referring provider.    History of Present Illness: He is here for follow up of his chronic medical conditions.    He is not exercising regularly.    Hypertension: He is taking his medication daily. He is compliant with a low sodium diet.  He denies chest pain, palpitations, edema, shortness of breath and regular headaches.  He does not monitor his blood pressure at home.    Hyperlipidemia: He is taking his medication daily. He is compliant with a low fat/cholesterol diet. He denies myalgias.   Hyperglycemia:  He is fairly compliant with a low sugar/carbohydrate diet.  He is not exercising regularly.  Hypothyroidism:  He is taking his medication daily.  He denies any recent changes in energy or weight that are unexplained.   Asthma:  He is using advair once a day.  Albuterol 1-2 a week at most.  Overall he feels his asthma is currently well controlled.  He denies any active cough, wheeze or shortness of breath.  GERD:  He is taking his medication daily as prescribed.  He denies any GERD symptoms and feels his GERD is well controlled.    About one month ago he right had couple of fingers went numb 4-5th fingers, he had numbness near his mouth on the right side and his right fourth-fifth toes were also numb.  He denied any weakness, change in vision/ diff speaking or swallowing at that time.  Took 24 hr for the numbing to start to fade.  It was completely gone after 2 days.  He did end up going to the emergency room and followed up with Dr.  Jannifer Franklin.  An MRI was ordered and depending on what that showed Dr. Jannifer Franklin planned on possibly doing further evaluation.  The MRI was canceled because of the coronavirus situation and he has not been called to reschedule this.  He is taking a baby aspirin daily.  He is also taking his cholesterol medication on a daily basis.   Review of Systems  Constitutional: Negative for chills and fever.  Respiratory: Negative for cough, shortness of breath and wheezing.   Cardiovascular: Negative for chest pain, palpitations and leg swelling.  Neurological: Negative for dizziness and headaches.     Social History   Socioeconomic History  . Marital status: Married    Spouse name: Not on file  . Number of children: Not on file  . Years of education: Not on file  . Highest education level: Not on file  Occupational History  . Not on file  Social Needs  . Financial resource strain: Not on file  . Food insecurity:    Worry: Not on file    Inability: Not on file  . Transportation needs:    Medical: Not on file    Non-medical: Not on file  Tobacco Use  . Smoking status: Never Smoker  . Smokeless tobacco: Never Used  Substance and Sexual Activity  . Alcohol use: Yes    Comment: Rarely  . Drug use: No  . Sexual  activity: Not on file  Lifestyle  . Physical activity:    Days per week: Not on file    Minutes per session: Not on file  . Stress: Not on file  Relationships  . Social connections:    Talks on phone: Not on file    Gets together: Not on file    Attends religious service: Not on file    Active member of club or organization: Not on file    Attends meetings of clubs or organizations: Not on file    Relationship status: Not on file  Other Topics Concern  . Not on file  Social History Narrative   Exercise: none     Observations/Objective: Appears well in NAD   Assessment and Plan:  See Problem List for Assessment and Plan of chronic medical problems.   Follow Up  Instructions:    I discussed the assessment and treatment plan with the patient. The patient was provided an opportunity to ask questions and all were answered. The patient agreed with the plan and demonstrated an understanding of the instructions.   The patient was advised to call back or seek an in-person evaluation if the symptoms worsen or if the condition fails to improve as anticipated.  He will come to the lab to have blood work done-want to confirm his LDL is less than 70.  Also on make sure his kidney function is normal sugars controlled  FU in 6 months - CPE     Binnie Rail, MD

## 2019-05-21 ENCOUNTER — Encounter: Payer: Self-pay | Admitting: Internal Medicine

## 2019-05-21 ENCOUNTER — Other Ambulatory Visit: Payer: Self-pay

## 2019-05-21 ENCOUNTER — Ambulatory Visit (INDEPENDENT_AMBULATORY_CARE_PROVIDER_SITE_OTHER): Payer: 59 | Admitting: Internal Medicine

## 2019-05-21 DIAGNOSIS — J45909 Unspecified asthma, uncomplicated: Secondary | ICD-10-CM

## 2019-05-21 DIAGNOSIS — I10 Essential (primary) hypertension: Secondary | ICD-10-CM

## 2019-05-21 DIAGNOSIS — R739 Hyperglycemia, unspecified: Secondary | ICD-10-CM

## 2019-05-21 DIAGNOSIS — J4520 Mild intermittent asthma, uncomplicated: Secondary | ICD-10-CM

## 2019-05-21 DIAGNOSIS — E039 Hypothyroidism, unspecified: Secondary | ICD-10-CM | POA: Diagnosis not present

## 2019-05-21 DIAGNOSIS — E785 Hyperlipidemia, unspecified: Secondary | ICD-10-CM

## 2019-05-21 MED ORDER — LEVOTHYROXINE SODIUM 100 MCG PO TABS: 100.0000 ug | ORAL_TABLET | Freq: Every day | ORAL | 1 refills | Status: DC

## 2019-05-21 MED ORDER — EZETIMIBE-SIMVASTATIN 10-40 MG PO TABS: 1.0000 | ORAL_TABLET | Freq: Every day | ORAL | 1 refills | Status: DC

## 2019-05-21 MED ORDER — MONTELUKAST SODIUM 10 MG PO TABS: 10.0000 mg | ORAL_TABLET | Freq: Every day | ORAL | 1 refills | Status: DC

## 2019-05-21 MED ORDER — LISINOPRIL 10 MG PO TABS: 10.0000 mg | ORAL_TABLET | Freq: Every day | ORAL | 3 refills | Status: DC

## 2019-05-21 MED ORDER — PANTOPRAZOLE SODIUM 40 MG PO TBEC: 40.0000 mg | DELAYED_RELEASE_TABLET | Freq: Every day | ORAL | 1 refills | Status: DC

## 2019-05-21 MED ORDER — SYNTHROID 100 MCG PO TABS: 100.0000 ug | ORAL_TABLET | Freq: Every day | ORAL | 1 refills | Status: DC

## 2019-05-21 NOTE — Assessment & Plan Note (Signed)
Controlled Continue advair daily Albuterol 1-2 times a week max continue

## 2019-05-21 NOTE — Assessment & Plan Note (Signed)
Check lipid panel.cmp, tsh Continue daily statin Regular exercise and healthy diet encouraged

## 2019-05-21 NOTE — Assessment & Plan Note (Addendum)
Clinically euthyroid Check tsh  Titrate med dose if needed  

## 2019-05-21 NOTE — Assessment & Plan Note (Signed)
BP Readings from Last 3 Encounters:  04/06/19 (!) 176/86  10/23/18 (!) 142/84  10/13/18 (!) 150/92   BP ? Controlled - he does not check his BP  - stressed that he needs to check his BP at home Continue current medications at current doses Stressed importance of making sure BP is controlled

## 2019-05-21 NOTE — Assessment & Plan Note (Signed)
Check a1c Low sugar / carb diet Stressed regular exercise   

## 2019-11-16 NOTE — Patient Instructions (Addendum)
  Tests ordered today. Your results will be released to [facility] (or called to you) after review.  If any changes need to be made, you will be notified at that same time.   No immunization administered today.   Medications reviewed and updated.  Changes include :     Your prescription(s) have been submitted to your pharmacy. Please take as directed and contact our office if you believe you are having problem(s) with the medication(s).  A referral was ordered for    Please followup in 6 months

## 2019-11-16 NOTE — Progress Notes (Signed)
Subjective:    Patient ID: Adam Sullivan, male    DOB: [birth year], 63 y.o.   MRN: [id number]  HPI The patient is here for follow up.      Medications and allergies reviewed with patient and updated if appropriate.  Patient Active Problem List   Diagnosis Date Noted  . Left shoulder tendinitis 10/13/2018  . Unsteady 09/21/2018  . Poor short-term memory 09/21/2018  . Drooping eyelid disease, left 09/21/2018  . History of prostate cancer 09/23/2016  . Essential hypertension 09/23/2016  . Hyperglycemia 05/20/2014  . Brain tumor (benign) ([place]) 06/04/2011  . GERD (gastroesophageal reflux disease) 06/04/2011  . Dyslipidemia 06/04/2011  . Headache disorder 06/04/2011  . Hypothyroidism 03/18/2008  . Asthma 03/18/2008    Current Outpatient Medications on File Prior to Visit  Medication Sig Dispense Refill  . ADVAIR DISKUS 250-50 MCG/DOSE AEPB USE 1 PUFF EVERY 12 HOURS. (Patient taking differently: Inhale 1 puff into the lungs every 12 (twelve) hours. ) 60 each 0  . albuterol (PROAIR HFA) 108 (90 Base) MCG/ACT inhaler USE @ PUFFS EVERY 6 HOURS AS NEEDED FOR WHEEZING 8.5 g 5  . aspirin 81 MG tablet Take 81 mg by mouth daily.    Marland Kitchen ezetimibe-simvastatin (VYTORIN) 10-40 MG tablet Take 1 tablet by mouth at bedtime. 90 tablet 1  . gabapentin (NEURONTIN) 300 MG capsule Take 300 mg by mouth 3 (three) times daily.      Marland Kitchen ibuprofen (ADVIL,MOTRIN) 200 MG tablet Take 400 mg by mouth every 4 (four) hours as needed for headache.    . lisinopril (ZESTRIL) 10 MG tablet Take 1 tablet (10 mg total) by mouth daily. 90 tablet 3  . montelukast (SINGULAIR) 10 MG tablet Take 1 tablet (10 mg total) by mouth at bedtime. 90 tablet 1  . Multiple Vitamin (MULTIVITAMIN) capsule Take 1 capsule by mouth daily.    . pantoprazole (PROTONIX) 40 MG tablet Take 1 tablet (40 mg total) by mouth daily. 90 tablet 1  . SYNTHROID 100 MCG tablet Take 1 tablet (100 mcg total) by mouth daily before breakfast. 90 tablet 1  .  tiZANidine (ZANAFLEX) 4 MG tablet Take 4 mg by mouth every 6 (six) hours as needed (headaches).      No current facility-administered medications on file prior to visit.     Past Medical History:  Diagnosis Date  . Allergy   . Anxiety   . Asthma    severe, Dr. Linna Darner  . Blood clots in brain    history of   . Brain tumor ([place]) 1993  . Depression    situational  . GERD (gastroesophageal reflux disease)    severe  . Headache(784.0)   . Hyperlipidemia   . Orchalgia   . Pneumonia    hx of  . Prostate cancer ([place]) 07/01/13   gleason 6  . Thyroid disease    hypothyroidism    Past Surgical History:  Procedure Laterality Date  . brain shunt  1993   put in, removed, put in again  . LYMPHADENECTOMY Bilateral 10/22/2013   Procedure: LYMPHADENECTOMY;  Surgeon: Alexis Frock, MD;  Location: [facility];  Service: Urology;  Laterality: Bilateral;  . 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 removed blood clot     X 2  . ROBOT ASSISTED LAPAROSCOPIC RADICAL PROSTATECTOMY N/A 10/22/2013   Procedure: ROBOTIC ASSISTED LAPAROSCOPIC RADICAL PROSTATECTOMY   PRE- PERITONEAL APPROACH;  Surgeon: Alexis Frock, MD;  Location: [facility];  Service: Urology;  Laterality: N/A;  3.5 HRS     Social History   Socioeconomic History  . Marital status: Married    Spouse name: Not on file  . Number of children: Not on file  . Years of education: Not on file  . Highest education level: Not on file  Occupational History  . Not on file  Social Needs  . Financial resource strain: Not on file  . Food insecurity    Worry: Not on file    Inability: Not on file  . Transportation needs    Medical: Not on file    Non-medical: Not on file  Tobacco Use  . Smoking status: Never Smoker  . Smokeless tobacco: Never Used  Substance and Sexual Activity  . Alcohol use: Yes    Comment: Rarely  . Drug use: No  . Sexual activity: Not on file  Lifestyle  . Physical activity    Days per week: Not on file     Minutes per session: Not on file  . Stress: Not on file  Relationships  . Social Herbalist on phone: Not on file    Gets together: Not on file    Attends religious service: Not on file    Active member of club or organization: Not on file    Attends meetings of clubs or organizations: Not on file    Relationship status: Not on file  Other Topics Concern  . Not on file  Social History Narrative   Exercise: none    Family History  Problem Relation Age of Onset  . Cancer Brother        anal cancer  . Asthma Maternal Uncle   . Cancer Father        prostate  cancer  . Testicular cancer Father   . Coronary artery disease Mother 21       5 vessel CABG  . Colon cancer Neg Hx   . Stomach cancer Neg Hx     Review of Systems     Objective:  There were no vitals filed for this visit. BP Readings from Last 3 Encounters:  04/06/19 (!) 176/86  10/23/18 (!) 142/84  10/13/18 (!) 150/92   Wt Readings from Last 3 Encounters:  04/06/19 182 lb (82.6 kg)  10/23/18 173 lb 12.8 oz (78.8 kg)  10/13/18 177 lb (80.3 kg)   There is no height or weight on file to calculate BMI.   Physical Exam          Assessment & Plan:    See Problem List for Assessment and Plan of chronic medical problems.   This encounter was created in error - please disregard.

## 2019-11-17 ENCOUNTER — Encounter: Payer: 59 | Admitting: Internal Medicine

## 2019-11-18 NOTE — Patient Instructions (Addendum)
  Tests ordered today. Your results will be released to MyChart (or called to you) after review.  If any changes need to be made, you will be notified at that same time.    Medications reviewed and updated.  Changes include :     Your prescription(s) have been submitted to your pharmacy. Please take as directed and contact our office if you believe you are having problem(s) with the medication(s).  A referral was ordered for   Please followup in 6 months   

## 2019-11-18 NOTE — Progress Notes (Signed)
Subjective:    Patient ID: Adam Sullivan, male    DOB: 1956-09-07, 63 y.o.   MRN: [id number]  HPI The patient is here for follow up.  He is not exercising regularly.  He is less active since retiring three months ago.     Hypertension: He is taking his medication daily. He is compliant with a low sodium diet.  He denies chest pain, palpitations, edema, shortness of breath and regular headaches.  He does not monitor his blood pressure at home.    GERD:  He is taking his medication daily as prescribed.  He denies any GERD symptoms and feels his GERD is well controlled.   Hypothyroidism:  He is taking his medication daily.  He denies any recent changes in energy or weight that are unexplained.   Hyperlipidemia: He is taking his medication daily. He is compliant with a low fat/cholesterol diet. He denies myalgias.     Medications and allergies reviewed with patient and updated if appropriate.  Patient Active Problem List   Diagnosis Date Noted  . Left shoulder tendinitis 10/13/2018  . Unsteady 09/21/2018  . Poor short-term memory 09/21/2018  . Drooping eyelid disease, left 09/21/2018  . History of prostate cancer 09/23/2016  . Essential hypertension 09/23/2016  . Hyperglycemia 05/20/2014  . Brain tumor (benign) ([place]) 06/04/2011  . GERD (gastroesophageal reflux disease) 06/04/2011  . Dyslipidemia 06/04/2011  . Headache disorder 06/04/2011  . Hypothyroidism 03/18/2008  . Asthma 03/18/2008    Current Outpatient Medications on File Prior to Visit  Medication Sig Dispense Refill  . ADVAIR DISKUS 250-50 MCG/DOSE AEPB USE 1 PUFF EVERY 12 HOURS. (Patient taking differently: Inhale 1 puff into the lungs every 12 (twelve) hours. ) 60 each 0  . albuterol (PROAIR HFA) 108 (90 Base) MCG/ACT inhaler USE @ PUFFS EVERY 6 HOURS AS NEEDED FOR WHEEZING 8.5 g 5  . aspirin 81 MG tablet Take 81 mg by mouth daily.    Marland Kitchen ezetimibe-simvastatin (VYTORIN) 10-40 MG tablet Take 1 tablet by mouth at  bedtime. 90 tablet 1  . gabapentin (NEURONTIN) 300 MG capsule Take 300 mg by mouth 3 (three) times daily.      Marland Kitchen ibuprofen (ADVIL,MOTRIN) 200 MG tablet Take 400 mg by mouth every 4 (four) hours as needed for headache.    . lisinopril (ZESTRIL) 10 MG tablet Take 1 tablet (10 mg total) by mouth daily. 90 tablet 3  . montelukast (SINGULAIR) 10 MG tablet Take 1 tablet (10 mg total) by mouth at bedtime. 90 tablet 1  . Multiple Vitamin (MULTIVITAMIN) capsule Take 1 capsule by mouth daily.    . pantoprazole (PROTONIX) 40 MG tablet Take 1 tablet (40 mg total) by mouth daily. 90 tablet 1  . SYNTHROID 100 MCG tablet Take 1 tablet (100 mcg total) by mouth daily before breakfast. 90 tablet 1  . tiZANidine (ZANAFLEX) 4 MG tablet Take 4 mg by mouth every 6 (six) hours as needed (headaches).      No current facility-administered medications on file prior to visit.     Past Medical History:  Diagnosis Date  . Allergy   . Anxiety   . Asthma    severe, Dr. Linna Darner  . Blood clots in brain    history of   . Brain tumor ([place]) 1993  . Depression    situational  . GERD (gastroesophageal reflux disease)    severe  . Headache(784.0)   . Hyperlipidemia   . Orchalgia   . Pneumonia  hx of  . Prostate cancer ([place]) 07/01/13   gleason 6  . Thyroid disease    hypothyroidism    Past Surgical History:  Procedure Laterality Date  . brain shunt  1993   put in, removed, put in again  . LYMPHADENECTOMY Bilateral 10/22/2013   Procedure: LYMPHADENECTOMY;  Surgeon: Alexis Frock, MD;  Location: [facility];  Service: Urology;  Laterality: Bilateral;  . 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 removed blood clot     X 2  . ROBOT ASSISTED LAPAROSCOPIC RADICAL PROSTATECTOMY N/A 10/22/2013   Procedure: ROBOTIC ASSISTED LAPAROSCOPIC RADICAL PROSTATECTOMY   PRE- PERITONEAL APPROACH;  Surgeon: Alexis Frock, MD;  Location[facility];  Service: Urology;  Laterality: N/A;  3.5 HRS     Social History    Socioeconomic History  . Marital status: Married    Spouse name: Not on file  . Number of children: Not on file  . Years of education: Not on file  . Highest education level: Not on file  Occupational History  . Not on file  Social Needs  . Financial resource strain: Not on file  . Food insecurity    Worry: Not on file    Inability: Not on file  . Transportation needs    Medical: Not on file    Non-medical: Not on file  Tobacco Use  . Smoking status: Never Smoker  . Smokeless tobacco: Never Used  Substance and Sexual Activity  . Alcohol use: Yes    Comment: Rarely  . Drug use: No  . Sexual activity: Not on file  Lifestyle  . Physical activity    Days per week: Not on file    Minutes per session: Not on file  . Stress: Not on file  Relationships  . Social Herbalist on phone: Not on file    Gets together: Not on file    Attends religious service: Not on file    Active member of club or organization: Not on file    Attends meetings of clubs or organizations: Not on file    Relationship status: Not on file  Other Topics Concern  . Not on file  Social History Narrative   Exercise: none    Family History  Problem Relation Age of Onset  . Cancer Brother        anal cancer  . Asthma Maternal Uncle   . Cancer Father        prostate  cancer  . Testicular cancer Father   . Coronary artery disease Mother 42       5 vessel CABG  . Colon cancer Neg Hx   . Stomach cancer Neg Hx     Review of Systems  Constitutional: Negative for chills and fever.  Eyes: Negative for visual disturbance.  Respiratory: Positive for cough (allergy/weather related). Negative for shortness of breath and wheezing.   Cardiovascular: Negative for chest pain, palpitations and leg swelling.  Neurological: Negative for dizziness, light-headedness, numbness and headaches.       Objective:   Vitals:   11/19/19 0836  BP: 118/80  Pulse: 64  Temp: 98.3 F (36.8 C)  SpO2: 98%    BP Readings from Last 3 Encounters:  11/19/19 118/80  04/06/19 (!) 176/86  10/23/18 (!) 142/84   Wt Readings from Last 3 Encounters:  11/19/19 188 lb (85.3 kg)  04/06/19 182 lb (82.6 kg)  10/23/18 173 lb 12.8 oz (78.8 kg)   Body  mass index is 30.34 kg/m.   Physical Exam    Constitutional: Appears well-developed and well-nourished. No distress.  HENT:  Head: Normocephalic and atraumatic.  Neck: Neck supple. No tracheal deviation present. No thyromegaly present.  No cervical lymphadenopathy Cardiovascular: Normal rate, regular rhythm and normal heart sounds.   No murmur heard. No carotid bruit .  No edema Pulmonary/Chest: Effort normal and breath sounds normal. No respiratory distress. No has no wheezes. No rales.  Skin: Skin is warm and dry. Not diaphoretic.  Psychiatric: Normal mood and affect. Behavior is normal.      Assessment & Plan:    See Problem List for Assessment and Plan of chronic medical problems.    This visit occurred during the SARS-CoV-2 public health emergency.  Safety protocols were in place, including screening questions prior to the visit, additional usage of staff PPE, and extensive cleaning of exam room while observing appropriate contact time as indicated for disinfecting solutions.

## 2019-11-19 ENCOUNTER — Other Ambulatory Visit: Payer: Self-pay

## 2019-11-19 ENCOUNTER — Encounter: Payer: Self-pay | Admitting: Internal Medicine

## 2019-11-19 ENCOUNTER — Ambulatory Visit (INDEPENDENT_AMBULATORY_CARE_PROVIDER_SITE_OTHER): Payer: 59 | Admitting: Internal Medicine

## 2019-11-19 VITALS — BP 118/80 | HR 64 | Temp 98.3°F | Ht 66.0 in | Wt 188.0 lb

## 2019-11-19 DIAGNOSIS — I10 Essential (primary) hypertension: Secondary | ICD-10-CM

## 2019-11-19 DIAGNOSIS — R739 Hyperglycemia, unspecified: Secondary | ICD-10-CM | POA: Diagnosis not present

## 2019-11-19 DIAGNOSIS — E785 Hyperlipidemia, unspecified: Secondary | ICD-10-CM

## 2019-11-19 DIAGNOSIS — J45909 Unspecified asthma, uncomplicated: Secondary | ICD-10-CM

## 2019-11-19 DIAGNOSIS — K219 Gastro-esophageal reflux disease without esophagitis: Secondary | ICD-10-CM

## 2019-11-19 DIAGNOSIS — E039 Hypothyroidism, unspecified: Secondary | ICD-10-CM | POA: Diagnosis not present

## 2019-11-19 NOTE — Assessment & Plan Note (Signed)
Check a1c Low sugar / carb diet Stressed regular exercise   

## 2019-11-19 NOTE — Assessment & Plan Note (Signed)
Clinically euthyroid Check tsh  Titrate med dose if needed  

## 2019-11-19 NOTE — Assessment & Plan Note (Signed)
Check lipid panel  Continue daily statin Regular exercise and healthy diet encouraged  

## 2019-11-19 NOTE — Assessment & Plan Note (Signed)
BP well controlled Current regimen effective and well tolerated Continue current medications at current doses cmp  

## 2019-11-19 NOTE — Assessment & Plan Note (Signed)
He feels his asthma is controlled Continue advair daily Albuterol prn continue

## 2020-01-27 NOTE — Progress Notes (Signed)
Subjective:    Patient ID: Adam Sullivan, male    DOB: 08-Nov-1956, 64 y.o.   MRN: [id number]  HPI The patient is here for an acute visit.   For the past week he has had increased sweating in his gential region, his underwear is soggy.  He has had some urine leakage.  He just feels like this area is sweating for no reason.  He has needed to shower more and change his underwear.  He denies any changes in medications, supplements, food or lifestyle.  He has not been dressing different.  He has not had the house of the different temperature.  He has not had diffuse sweating.  He denies a history of urinary tract infections.  He does note some mild overall scrotal swelling and soreness, but denies penile pain, lesions or soreness.  He does have a history of a VP shunt.  He denies any change in his balance, which is poor to begin with.  He denies any recent changes in memory or confusion.   His blood pressure is elevated here today.  He is taking his medications as prescribed.  Medications and allergies reviewed with patient and updated if appropriate.  Patient Active Problem List   Diagnosis Date Noted  . Left shoulder tendinitis 10/13/2018  . Unsteady 09/21/2018  . Poor short-term memory 09/21/2018  . Drooping eyelid disease, left 09/21/2018  . History of prostate cancer 09/23/2016  . Essential hypertension 09/23/2016  . Hyperglycemia 05/20/2014  . Brain tumor (benign) ([place]) 06/04/2011  . GERD (gastroesophageal reflux disease) 06/04/2011  . Dyslipidemia 06/04/2011  . Headache disorder 06/04/2011  . Hypothyroidism 03/18/2008  . Asthma 03/18/2008    Current Outpatient Medications on File Prior to Visit  Medication Sig Dispense Refill  . ADVAIR DISKUS 250-50 MCG/DOSE AEPB USE 1 PUFF EVERY 12 HOURS. (Patient taking differently: Inhale 1 puff into the lungs every 12 (twelve) hours. ) 60 each 0  . albuterol (PROAIR HFA) 108 (90 Base) MCG/ACT inhaler USE @ PUFFS EVERY 6 HOURS AS NEEDED  FOR WHEEZING 8.5 g 5  . aspirin 81 MG tablet Take 81 mg by mouth daily.    Marland Kitchen ezetimibe-simvastatin (VYTORIN) 10-40 MG tablet Take 1 tablet by mouth at bedtime. 90 tablet 1  . gabapentin (NEURONTIN) 300 MG capsule Take 300 mg by mouth 3 (three) times daily.      Marland Kitchen ibuprofen (ADVIL,MOTRIN) 200 MG tablet Take 400 mg by mouth every 4 (four) hours as needed for headache.    . lisinopril (ZESTRIL) 10 MG tablet Take 1 tablet (10 mg total) by mouth daily. 90 tablet 3  . montelukast (SINGULAIR) 10 MG tablet Take 1 tablet (10 mg total) by mouth at bedtime. 90 tablet 1  . Multiple Vitamin (MULTIVITAMIN) capsule Take 1 capsule by mouth daily.    . pantoprazole (PROTONIX) 40 MG tablet Take 1 tablet (40 mg total) by mouth daily. 90 tablet 1  . SYNTHROID 100 MCG tablet Take 1 tablet (100 mcg total) by mouth daily before breakfast. 90 tablet 1  . tiZANidine (ZANAFLEX) 4 MG tablet Take 4 mg by mouth every 6 (six) hours as needed (headaches).      No current facility-administered medications on file prior to visit.    Past Medical History:  Diagnosis Date  . Allergy   . Anxiety   . Asthma    severe, Dr. Linna Darner  . Blood clots in brain    history of   . Brain tumor ([place]) 1993  .  Depression    situational  . GERD (gastroesophageal reflux disease)    severe  . Headache(784.0)   . Hyperlipidemia   . Orchalgia   . Pneumonia    hx of  . Prostate cancer ([place]) 07/01/13   gleason 6  . Thyroid disease    hypothyroidism    Past Surgical History:  Procedure Laterality Date  . brain shunt  1993   put in, removed, put in again  . LYMPHADENECTOMY Bilateral 10/22/2013   Procedure: LYMPHADENECTOMY;  Surgeon: Alexis Frock, MD;  [facility];  Service: Urology;  Laterality: Bilateral;  . 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 removed blood clot     X 2  . ROBOT ASSISTED LAPAROSCOPIC RADICAL PROSTATECTOMY N/A 10/22/2013   Procedure: ROBOTIC ASSISTED LAPAROSCOPIC RADICAL PROSTATECTOMY    PRE- PERITONEAL APPROACH;  Surgeon: Alexis Frock, MD;  Location[facility];  Service: Urology;  Laterality: N/A;  3.5 HRS     Social History   Socioeconomic History  . Marital status: Married    Spouse name: Not on file  . Number of children: Not on file  . Years of education: Not on file  . Highest education level: Not on file  Occupational History  . Not on file  Tobacco Use  . Smoking status: Never Smoker  . Smokeless tobacco: Never Used  Substance and Sexual Activity  . Alcohol use: Yes    Comment: Rarely  . Drug use: No  . Sexual activity: Not on file  Other Topics Concern  . Not on file  Social History Narrative   Exercise: none   Social Determinants of Health   Financial Resource Strain:   . Difficulty of Paying Living Expenses: Not on file  Food Insecurity:   . Worried About Charity fundraiser in the Last Year: Not on file  . Ran Out of Food in the Last Year: Not on file  Transportation Needs:   . Lack of Transportation (Medical): Not on file  . Lack of Transportation (Non-Medical): Not on file  Physical Activity:   . Days of Exercise per Week: Not on file  . Minutes of Exercise per Session: Not on file  Stress:   . Feeling of Stress : Not on file  Social Connections:   . Frequency of Communication with Friends and Family: Not on file  . Frequency of Social Gatherings with Friends and Family: Not on file  . Attends Religious Services: Not on file  . Active Member of Clubs or Organizations: Not on file  . Attends Archivist Meetings: Not on file  . Marital Status: Not on file    Family History  Problem Relation Age of Onset  . Cancer Brother        anal cancer  . Asthma Maternal Uncle   . Cancer Father        prostate  cancer  . Testicular cancer Father   . Coronary artery disease Mother 39       5 vessel CABG  . Colon cancer Neg Hx   . Stomach cancer Neg Hx     Review of Systems  Constitutional: Positive for appetite change  (decreased) and fatigue. Negative for chills and fever.  Cardiovascular: Negative for leg swelling.  Gastrointestinal: Negative for abdominal pain, constipation, diarrhea and nausea.  Genitourinary: Positive for scrotal swelling (maybe and a little sore). Negative for difficulty urinating, dysuria, frequency, hematuria, penile pain and penile swelling.  No change in urine color or odor  Skin: Negative for rash.       Little itching at times in genital region  Neurological: Negative for light-headedness and headaches.       Objective:   Vitals:   01/28/20 1023  BP: (!) 146/80  Pulse: 75  Resp: 16  Temp: 98.3 F (36.8 C)  SpO2: 99%   BP Readings from Last 3 Encounters:  01/28/20 (!) 146/80  11/19/19 118/80  04/06/19 (!) 176/86   Wt Readings from Last 3 Encounters:  01/28/20 181 lb (82.1 kg)  11/19/19 188 lb (85.3 kg)  04/06/19 182 lb (82.6 kg)   Body mass index is 29.21 kg/m.   Physical Exam Constitutional:      General: He is not in acute distress.    Appearance: Normal appearance. He is not ill-appearing.  HENT:     Head: Normocephalic and atraumatic.  Abdominal:     General: There is no distension.     Palpations: Abdomen is soft. There is no mass.     Tenderness: There is no abdominal tenderness. There is no guarding or rebound.  Genitourinary:    Comments: Significant redness secondary to tinea cruris bilateral proximal inner thighs extending up to scrotum and perineum.  There is some associated scrotal swelling and tenderness.  No obvious open lesions or discharge.  No penile swelling or erythema Neurological:     Mental Status: He is alert.            Assessment & Plan:    See Problem List for Assessment and Plan of chronic medical problems.    This visit occurred during the SARS-CoV-2 public health emergency.  Safety protocols were in place, including screening questions prior to the visit, additional usage of staff PPE, and extensive cleaning  of exam room while observing appropriate contact time as indicated for disinfecting solutions.

## 2020-01-28 ENCOUNTER — Ambulatory Visit: Payer: 59 | Admitting: Internal Medicine

## 2020-01-28 ENCOUNTER — Other Ambulatory Visit: Payer: Self-pay

## 2020-01-28 ENCOUNTER — Encounter: Payer: Self-pay | Admitting: Internal Medicine

## 2020-01-28 VITALS — BP 146/80 | HR 75 | Temp 98.3°F | Resp 16 | Ht 66.0 in | Wt 181.0 lb

## 2020-01-28 DIAGNOSIS — R8290 Unspecified abnormal findings in urine: Secondary | ICD-10-CM

## 2020-01-28 DIAGNOSIS — B356 Tinea cruris: Secondary | ICD-10-CM | POA: Diagnosis not present

## 2020-01-28 DIAGNOSIS — I10 Essential (primary) hypertension: Secondary | ICD-10-CM | POA: Diagnosis not present

## 2020-01-28 LAB — POCT URINALYSIS DIPSTICK
Bilirubin, UA: NEGATIVE
Blood, UA: NEGATIVE
Glucose, UA: NEGATIVE
Ketones, UA: NEGATIVE
Leukocytes, UA: NEGATIVE
Nitrite, UA: NEGATIVE
Protein, UA: POSITIVE — AB
Spec Grav, UA: >=1.03 — AB (ref 1.010–1.025)
Urobilinogen, UA: NEGATIVE E.U./dL — AB
pH, UA: 6 (ref 5.0–8.0)

## 2020-01-28 LAB — URINALYSIS
Hgb urine dipstick: NEGATIVE
Leukocytes,Ua: NEGATIVE
Nitrite: NEGATIVE
Specific Gravity, Urine: 1.025 (ref 1.000–1.030)
Total Protein, Urine: NEGATIVE
Urine Glucose: NEGATIVE
Urobilinogen, UA: 0.2 (ref 0.0–1.0)
pH: 5.5 (ref 5.0–8.0)

## 2020-01-28 MED ORDER — CICLOPIROX OLAMINE 0.77 % EX CREA: TOPICAL_CREAM | Freq: Two times a day (BID) | CUTANEOUS | 5 refills | Status: DC

## 2020-01-28 MED ORDER — FLUCONAZOLE 150 MG PO TABS: 150.0000 mg | ORAL_TABLET | ORAL | 0 refills | Status: DC

## 2020-01-28 NOTE — Assessment & Plan Note (Signed)
Acute  he noted some possible urine leakage and there was a very abnormal smell to his urine with his sample Urine dip not overly convincing for a UTI Will send for urinalysis and urine culture Symptoms possibly related to dehydration-advised him to increase his fluids, especially water He may also think he is having some leakage but not actually be having leakage  We will treat tinea cruris and if urinary symptoms persist he can let me know-can reevaluate at that time

## 2020-01-28 NOTE — Assessment & Plan Note (Signed)
Acute Exam and symptoms consistent with tinea cruris, this is a more moderate infection He has associated discharge, swelling and soreness Fluconazole 150 mg weekly x4 Ciclopirox cream twice daily for numeral 2-numeral 4 weeks.  Advised that he needs to use this for at least a couple of days after the rash goes away to make sure that it does not recur and may need to use this as needed in the future He should call with any questions or concerns May ultimately need powder as a prevention Advised wearing loose clothing to avoid excessive moisture/sweat

## 2020-01-28 NOTE — Assessment & Plan Note (Signed)
Chronic Blood pressure elevated here today, likely related to being anxious about why he is here Continue current medications at current doses Would benefit from monitoring his blood pressure at home

## 2020-01-28 NOTE — Patient Instructions (Signed)
Take fluconazole once a week for 4 weeks.  Use ciclopirox twice daily to the affected area for 2-4 weeks.  Your may need to use this intermittently if it recurs.     Jock Itch Jock itch (tinea cruris) is an infection of the skin in the groin area. It is caused by a fungus, which is a type of germ that lives in dark, damp places. Jock itch causes an itchy rash in the groin and upper thigh area. It usually goes away in 2-3 weeks with treatment. What are the causes? The fungus that causes jock itch may be spread by:  Touching a fungus infection elsewhere on your body, such as athlete's foot, and then touching your groin area.  Sharing towels or clothing, such as socks or shoes, with someone who has a fungal infection. What increases the risk? Jock itch is most common in men and adolescent boys. You are also more likely to develop the condition if you:  Are in a hot, humid climate.  Wear tight-fitting clothing or wet bathing suits for long periods of time.  Play sports.  Are overweight.  Have diabetes.  Have a weakened immune system.  Sweat a lot. What are the signs or symptoms? Symptoms of jock itch may include:  A red, pink, or brown rash in the groin area. Blisters may be present. The rash may spread to the thighs, anus, and buttocks.  Dry and scaly skin on or around the rash.  Itchiness. How is this diagnosed? In most cases, your health care provider can make the diagnosis by looking at your rash. In some cases, a sample of infected skin may be scraped off. This sample may be examined under a microscope (biopsy) or by trying to grow the fungus from the sample (culture). How is this treated? Treatment for this condition may include:  Antifungal medicine to kill the fungus. This may be a skin cream, ointment, or powder, or it may be a medicine that you take by mouth.  Skin cream or ointment to reduce itching.  Lifestyle changes, such as wearing looser clothing and caring  for your skin. Follow these instructions at home: Skin care  Apply skin creams, ointments, or powders exactly as told by your health care provider.  Wear loose-fitting clothing that does not rub against your groin area. Men should wear boxer shorts or loose-fitting underwear.  Keep your groin area clean and dry. ? Change your underwear every day. ? Change out of wet bathing suits as soon as possible. ? After bathing, use a separate towel to dry your groin area thoroughly and gently. Using a separate towel will help prevent spreading the infection to other areas of your body.  Avoid hot baths and showers. Hot water can make itching worse.  Do not scratch the affected area. General instructions  Take and apply over-the-counter and prescription medicines only as told by your health care provider.  Do not share towels, clothing, or personal items with other people.  Wash your hands often with soap and water, especially after touching your groin area. If soap and water are not available, use alcohol-based hand sanitizer. Contact a health care provider if:  Your rash: ? Gets worse or does not get better after 2 weeks of treatment. ? Spreads. ? Returns after treatment is finished.  You have any of the following: ? A fever. ? New or worsening redness, swelling, or pain around your rash. ? Fluid, blood, or pus coming from your rash. Summary  Jock itch (tinea cruris) is a fungal infection of the skin in the groin area.  The fungus can be spread by sharing clothing or by touching a fungus infection elsewhere on your body and then touching your groin area.  Treatment may include antifungal medicine and lifestyle changes, such as keeping the area clean and dry. This information is not intended to replace advice given to you by your health care provider. Make sure you discuss any questions you have with your health care provider. Document Revised: 11/28/2017 Document Reviewed:  11/26/2017 Elsevier Patient Education  2020 [employer].

## 2020-01-29 LAB — URINE CULTURE: Result:: NO GROWTH

## 2020-01-30 ENCOUNTER — Encounter: Payer: Self-pay | Admitting: Internal Medicine

## 2020-02-09 NOTE — Progress Notes (Signed)
Virtual Visit via Video Note  I connected with Adam Sullivan on 02/10/20 at 11:00 AM EST by a video enabled telemedicine application and verified that I am speaking with the correct person using two identifiers.   I discussed the limitations of evaluation and management by telemedicine and the availability of in person appointments. The patient expressed understanding and agreed to proceed.  Present for the visit:  Myself, Dr Billey Gosling, [name] and his wife.  The patient is currently at home and I am in the office.    No referring provider.    History of Present Illness: This is an acute visit for fatigue, memory concerns.  Both jis wife and him contribute to the history.   Symptoms worse since November 2020.  He retired in August of 2020 - he did not want to retire, but was forced to because of the pandemic.  He was a Manufacturing systems engineer.    Fatigue, excessive sleeping:  He is sleeping a lot of the day away.   He will sleep until noon.  He will eat lunch and be up for a little bit and will go back for a nap, will eat dinner and be back to bed by 10pm.  He still sleep pretty good at night.  He does feel refreshed for a short time when he first wakes up.  He does feel some depression.     Memory concerns:  He has forgotten things he was told by his family.  He took his son to work, walked into the house and asked his other son where his son was that he just dropped off.  He incorrectly made dinner one night.  Instead of making the meat into patties he ground it all up.  He has been wearing the same clothes for the past 6 days.  sometimes they are not clean.   His wife also notes he has been walking bent at the waist - he does not stand up straight.  This is not new and has been going on for a while.     Review of Systems  Constitutional: Negative for chills, fever and malaise/fatigue.       Appetite less at times  Respiratory: Negative for cough, shortness of breath and wheezing.    Cardiovascular: Negative for chest pain, palpitations and leg swelling.  Gastrointestinal: Negative for abdominal pain, blood in stool, constipation, diarrhea, melena and nausea.  Neurological: Negative for dizziness, tingling, focal weakness and headaches.  Psychiatric/Behavioral: Positive for depression. The patient is not nervous/anxious.      Social History   Socioeconomic History  . Marital status: Married    Spouse name: Not on file  . Number of children: Not on file  . Years of education: Not on file  . Highest education level: Not on file  Occupational History  . Not on file  Tobacco Use  . Smoking status: Never Smoker  . Smokeless tobacco: Never Used  Substance and Sexual Activity  . Alcohol use: Yes    Comment: Rarely  . Drug use: No  . Sexual activity: Not on file  Other Topics Concern  . Not on file  Social History Narrative   Exercise: none   Social Determinants of Health   Financial Resource Strain:   . Difficulty of Paying Living Expenses: Not on file  Food Insecurity:   . Worried About Charity fundraiser in the Last Year: Not on file  . Ran Out of Food in the Last  Year: Not on file  Transportation Needs:   . Lack of Transportation (Medical): Not on file  . Lack of Transportation (Non-Medical): Not on file  Physical Activity:   . Days of Exercise per Week: Not on file  . Minutes of Exercise per Session: Not on file  Stress:   . Feeling of Stress : Not on file  Social Connections:   . Frequency of Communication with Friends and Family: Not on file  . Frequency of Social Gatherings with Friends and Family: Not on file  . Attends Religious Services: Not on file  . Active Member of Clubs or Organizations: Not on file  . Attends Archivist Meetings: Not on file  . Marital Status: Not on file     Observations/Objective: Appears well in NAD Breathing normally Mood appear normal  Assessment and Plan:  See Problem List for Assessment and  Plan of chronic medical problems.   Follow Up Instructions:    I discussed the assessment and treatment plan with the patient. The patient was provided an opportunity to ask questions and all were answered. The patient agreed with the plan and demonstrated an understanding of the instructions.   The patient was advised to call back or seek an in-person evaluation if the symptoms worsen or if the condition fails to improve as anticipated.    Binnie Rail, MD

## 2020-02-10 ENCOUNTER — Encounter: Payer: Self-pay | Admitting: Internal Medicine

## 2020-02-10 ENCOUNTER — Ambulatory Visit (INDEPENDENT_AMBULATORY_CARE_PROVIDER_SITE_OTHER): Payer: 59 | Admitting: Internal Medicine

## 2020-02-10 ENCOUNTER — Other Ambulatory Visit (INDEPENDENT_AMBULATORY_CARE_PROVIDER_SITE_OTHER): Payer: 59

## 2020-02-10 DIAGNOSIS — R413 Other amnesia: Secondary | ICD-10-CM | POA: Diagnosis not present

## 2020-02-10 DIAGNOSIS — R5383 Other fatigue: Secondary | ICD-10-CM

## 2020-02-10 DIAGNOSIS — F3289 Other specified depressive episodes: Secondary | ICD-10-CM | POA: Diagnosis not present

## 2020-02-10 DIAGNOSIS — E039 Hypothyroidism, unspecified: Secondary | ICD-10-CM

## 2020-02-10 DIAGNOSIS — I10 Essential (primary) hypertension: Secondary | ICD-10-CM

## 2020-02-10 DIAGNOSIS — E785 Hyperlipidemia, unspecified: Secondary | ICD-10-CM

## 2020-02-10 DIAGNOSIS — F329 Major depressive disorder, single episode, unspecified: Secondary | ICD-10-CM | POA: Insufficient documentation

## 2020-02-10 DIAGNOSIS — F32A Depression, unspecified: Secondary | ICD-10-CM | POA: Insufficient documentation

## 2020-02-10 LAB — COMPREHENSIVE METABOLIC PANEL
ALT: 28 U/L (ref 0–53)
AST: 29 U/L (ref 0–37)
Albumin: 3.7 g/dL (ref 3.5–5.2)
Alkaline Phosphatase: 74 U/L (ref 39–117)
BUN: 24 mg/dL — ABNORMAL HIGH (ref 6–23)
CO2: 30 mEq/L (ref 19–32)
Calcium: 9.3 mg/dL (ref 8.4–10.5)
Chloride: 99 mEq/L (ref 96–112)
Creatinine, Ser: 0.78 mg/dL (ref 0.40–1.50)
GFR: 100.99 mL/min (ref >60.00)
Glucose, Bld: 97 mg/dL (ref 70–99)
Potassium: 3.9 mEq/L (ref 3.5–5.1)
Sodium: 134 mEq/L — ABNORMAL LOW (ref 135–145)
Total Bilirubin: 0.3 mg/dL (ref 0.2–1.2)
Total Protein: 7.9 g/dL (ref 6.0–8.3)

## 2020-02-10 LAB — CBC WITH DIFFERENTIAL/PLATELET
Basophils Absolute: 0.1 10*3/uL (ref 0.0–0.1)
Basophils Relative: 1.3 % (ref 0.0–3.0)
Eosinophils Absolute: 0.5 10*3/uL (ref 0.0–0.7)
Eosinophils Relative: 8.2 % — ABNORMAL HIGH (ref 0.0–5.0)
HCT: 36.4 % — ABNORMAL LOW (ref 39.0–52.0)
Hemoglobin: 12.1 g/dL — ABNORMAL LOW (ref 13.0–17.0)
Lymphocytes Relative: 23.5 % (ref 12.0–46.0)
Lymphs Abs: 1.3 10*3/uL (ref 0.7–4.0)
MCHC: 33.3 g/dL (ref 30.0–36.0)
MCV: 87 fl (ref 78.0–100.0)
Monocytes Absolute: 0.6 10*3/uL (ref 0.1–1.0)
Monocytes Relative: 10.1 % (ref 3.0–12.0)
Neutro Abs: 3.2 10*3/uL (ref 1.4–7.7)
Neutrophils Relative %: 56.9 % (ref 43.0–77.0)
Platelets: 418 10*3/uL — ABNORMAL HIGH (ref 150.0–400.0)
RBC: 4.19 Mil/uL — ABNORMAL LOW (ref 4.22–5.81)
RDW: 14.6 % (ref 11.5–15.5)
WBC: 5.6 10*3/uL (ref 4.0–10.5)

## 2020-02-10 LAB — LIPID PANEL
Cholesterol: 157 mg/dL (ref 0–200)
HDL: 25.8 mg/dL — ABNORMAL LOW (ref >39.00)
LDL Cholesterol: 93 mg/dL (ref 0–99)
NonHDL: 131.33
Total CHOL/HDL Ratio: 6
Triglycerides: 192 mg/dL — ABNORMAL HIGH (ref 0.0–149.0)
VLDL: 38.4 mg/dL (ref 0.0–40.0)

## 2020-02-10 LAB — VITAMIN B12: Vitamin B-12: 594 pg/mL (ref 211–911)

## 2020-02-10 LAB — HEMOGLOBIN A1C: Hgb A1c MFr Bld: 6.7 % — ABNORMAL HIGH (ref 4.6–6.5)

## 2020-02-10 LAB — TSH: TSH: 0.02 u[IU]/mL — ABNORMAL LOW (ref 0.35–4.50)

## 2020-02-10 MED ORDER — FLUOXETINE HCL 20 MG PO TABS: 20.0000 mg | ORAL_TABLET | Freq: Every day | ORAL | 3 refills | Status: DC

## 2020-02-10 NOTE — Assessment & Plan Note (Signed)
Likely has depression and has excessive fatigue R/o thyroid as contributing tsh Will adjust medication if needed

## 2020-02-10 NOTE — Assessment & Plan Note (Signed)
Acute Having memory changes ? Related to depression, thyroid disease, other causes of memory loss Cbc, tsh, b12 Ct of head - has history of benign brain tumor, but unlikely his is contributing Refer to neuro

## 2020-02-10 NOTE — Assessment & Plan Note (Signed)
Acute I think depression is contributing to some of his symptoms, but may not be the only thing contributing Start prozac 20 mg daily Discussed possible side effects F/u in 4 weeks

## 2020-02-10 NOTE — Assessment & Plan Note (Signed)
Excessive fatigue, sleep most of the day Possibly depression Will check labs to r/o some causes of fatigue Tsh, cbc, cmp

## 2020-02-11 ENCOUNTER — Encounter: Payer: Self-pay | Admitting: Internal Medicine

## 2020-02-15 MED ORDER — SYNTHROID 88 MCG PO TABS: 88.0000 ug | ORAL_TABLET | Freq: Every day | ORAL | 1 refills | Status: DC

## 2020-02-15 MED ORDER — LEVOTHYROXINE SODIUM 88 MCG PO TABS: 88.0000 ug | ORAL_TABLET | Freq: Every day | ORAL | 1 refills | Status: DC

## 2020-02-15 NOTE — Addendum Note (Signed)
Addended by: Delice Bison E on: 02/15/2020 01:48 PM   Modules accepted: Orders

## 2020-02-15 NOTE — Addendum Note (Signed)
Addended by: Delice Bison E on: 02/15/2020 01:49 PM   Modules accepted: Orders

## 2020-02-16 ENCOUNTER — Ambulatory Visit (INDEPENDENT_AMBULATORY_CARE_PROVIDER_SITE_OTHER)
Admission: RE | Admit: 2020-02-16 | Discharge: 2020-02-16 | Disposition: A | Discharge status: Home / Self Care | Payer: 59 | Source: Ambulatory Visit | Attending: Internal Medicine | Admitting: Internal Medicine

## 2020-02-16 ENCOUNTER — Other Ambulatory Visit: Payer: Self-pay

## 2020-02-16 DIAGNOSIS — R413 Other amnesia: Secondary | ICD-10-CM

## 2020-02-16 MED ORDER — IOHEXOL 300 MG/ML  SOLN
80.0000 mL | Freq: Once | INTRAMUSCULAR | Status: AC | PRN
  Administered 2020-02-16: 15:00:00 80 mL via INTRAVENOUS

## 2020-02-17 ENCOUNTER — Encounter: Payer: Self-pay | Admitting: Internal Medicine

## 2020-02-21 ENCOUNTER — Other Ambulatory Visit: Payer: Self-pay

## 2020-02-21 ENCOUNTER — Encounter: Payer: Self-pay | Admitting: Neurology

## 2020-02-21 ENCOUNTER — Ambulatory Visit (INDEPENDENT_AMBULATORY_CARE_PROVIDER_SITE_OTHER): Payer: 59 | Admitting: Neurology

## 2020-02-21 NOTE — Progress Notes (Signed)
We canceled appt for today, as patient stated they have bed bugs. He has been advised to call to reschedule, when his home is beg bug free. He will not be charged for today's visit. He demonstrated understanding and agreement.

## 2020-03-08 DIAGNOSIS — Z8673 Personal history of transient ischemic attack (TIA), and cerebral infarction without residual deficits: Secondary | ICD-10-CM | POA: Insufficient documentation

## 2020-03-08 NOTE — Progress Notes (Signed)
Virtual Visit via Video Note  I connected with Adam Sullivan on 03/09/20 at 11:15 AM EST by a video enabled telemedicine application and verified that I am speaking with the correct person using two identifiers.   I discussed the limitations of evaluation and management by telemedicine and the availability of in person appointments. The patient expressed understanding and agreed to proceed.  Present for the visit:  Myself, Dr Billey Gosling, Adam Sullivan and his wife.  The patient is currently at home and I am in the office.    No referring provider.    History of Present Illness: He is here for follow up of his chronic medical conditions.     One month ago we discussed his fatigue, excessive sleeping, memory concerns, poor posture.  Blood work showed that he was a diabetic - he was prediabetic prior.  He is cutting back on sugars and carbohydrates.  He has substituted more wholesome carbohydrates for white carbohydrates.  He is not currently exercising.  His thyroid was overactive and his medication dose was adjusted.  He just started taking the lower dose a couple of weeks ago.    CT of his head showed prior infarcts.  Reviewed signs of stroke.  He is taking his baby aspirin daily.  He is taking his statin daily.  I diagnosed him with depression and we started prozac 20 mg daily.  He denies side effects.  He feels a little less depressed, but was not feeling super depressed before.    He was referred to neuro for evaluation, but was not able to be seen due to having bed bugs at home.  He will reschedule this.  He is still having some issues with his memory.  Review of Systems  Constitutional: Negative for fever.  Respiratory: Positive for wheezing (occ). Negative for cough and shortness of breath.   Cardiovascular: Negative for chest pain, palpitations and leg swelling.  Neurological: Negative for dizziness and headaches.     Social History   Socioeconomic History  . Marital  status: Married    Spouse name: Not on file  . Number of children: Not on file  . Years of education: Not on file  . Highest education level: Not on file  Occupational History  . Not on file  Tobacco Use  . Smoking status: Never Smoker  . Smokeless tobacco: Never Used  Substance and Sexual Activity  . Alcohol use: Yes    Comment: Rarely  . Drug use: No  . Sexual activity: Not on file  Other Topics Concern  . Not on file  Social History Narrative   Exercise: none   Social Determinants of Health   Financial Resource Strain:   . Difficulty of Paying Living Expenses:   Food Insecurity:   . Worried About Charity fundraiser in the Last Year:   . Arboriculturist in the Last Year:   Transportation Needs:   . Film/video editor (Medical):   Marland Kitchen Lack of Transportation (Non-Medical):   Physical Activity:   . Days of Exercise per Week:   . Minutes of Exercise per Session:   Stress:   . Feeling of Stress :   Social Connections:   . Frequency of Communication with Friends and Family:   . Frequency of Social Gatherings with Friends and Family:   . Attends Religious Services:   . Active Member of Clubs or Organizations:   . Attends Archivist Meetings:   Marland Kitchen Marital Status:  Observations/Objective: Appears well in NAD Breathing normally Mood and affect normal  Assessment and Plan:  See Problem List for Assessment and Plan of chronic medical problems.   Follow Up Instructions:    I discussed the assessment and treatment plan with the patient. The patient was provided an opportunity to ask questions and all were answered. The patient agreed with the plan and demonstrated an understanding of the instructions.   The patient was advised to call back or seek an in-person evaluation if the symptoms worsen or if the condition fails to improve as anticipated.    Binnie Rail, MD

## 2020-03-09 ENCOUNTER — Encounter: Payer: Self-pay | Admitting: Internal Medicine

## 2020-03-09 ENCOUNTER — Ambulatory Visit (INDEPENDENT_AMBULATORY_CARE_PROVIDER_SITE_OTHER): Payer: 59 | Admitting: Internal Medicine

## 2020-03-09 DIAGNOSIS — I10 Essential (primary) hypertension: Secondary | ICD-10-CM

## 2020-03-09 DIAGNOSIS — Z8673 Personal history of transient ischemic attack (TIA), and cerebral infarction without residual deficits: Secondary | ICD-10-CM

## 2020-03-09 DIAGNOSIS — R413 Other amnesia: Secondary | ICD-10-CM | POA: Diagnosis not present

## 2020-03-09 DIAGNOSIS — E785 Hyperlipidemia, unspecified: Secondary | ICD-10-CM

## 2020-03-09 DIAGNOSIS — E119 Type 2 diabetes mellitus without complications: Secondary | ICD-10-CM | POA: Diagnosis not present

## 2020-03-09 DIAGNOSIS — F3289 Other specified depressive episodes: Secondary | ICD-10-CM

## 2020-03-09 DIAGNOSIS — E039 Hypothyroidism, unspecified: Secondary | ICD-10-CM | POA: Diagnosis not present

## 2020-03-09 NOTE — Assessment & Plan Note (Signed)
Chronic Dose changed to 88 mcg 2 weeks ago Needs repeat TSH in about 6 weeks

## 2020-03-09 NOTE — Assessment & Plan Note (Signed)
Taking fluoxetine daily without side effects Thinks this may have helped a little, but does not feel like he needs a higher dose He denies ever really being truly depressed He is still sleeping excessively and often gets up in the middle the night and watches TV or will take a hot shower Discussed sleep hygiene Encouraged regular exercise

## 2020-03-09 NOTE — Assessment & Plan Note (Signed)
Chronic Lipids not ideally controlled with his most recent blood work, but were prior to that He is not been having L healthy lifestyle over the past several months secondary to the Covid pandemic and is starting to make changes Will recheck lipids in 6 weeks Continue Vytorin

## 2020-03-09 NOTE — Assessment & Plan Note (Signed)
He continues to have some memory issues He will reschedule his neurology appointment for further evaluation Previous strokes could be contributing

## 2020-03-09 NOTE — Assessment & Plan Note (Signed)
Chronic This is a new diagnosis for him Discussed lifestyle changes that he needs to make including low-carb/sugar diet, regular exercise and keeping his weight down Discussed that right now he does not need medication because his sugars are controlled, but he may need medication in the future Will recheck A1c in 6 weeks, follow-up in 4 months

## 2020-03-09 NOTE — Assessment & Plan Note (Signed)
Chronic Does not monitor his pressures at home Continue current medications

## 2020-03-09 NOTE — Assessment & Plan Note (Signed)
CT of head showed prior infarcts-unknown when Taking aspirin 81 mg will continue Discussed that his LDL or bad cholesterol was better controlled a year and a half ago and that he should work on lifestyle to get this lower or else we will need to increase his cholesterol medication or change it Will reschedule neurology appointment for further evaluation and management

## 2020-03-28 ENCOUNTER — Other Ambulatory Visit (INDEPENDENT_AMBULATORY_CARE_PROVIDER_SITE_OTHER): Payer: 59

## 2020-03-28 DIAGNOSIS — E785 Hyperlipidemia, unspecified: Secondary | ICD-10-CM

## 2020-03-28 DIAGNOSIS — I10 Essential (primary) hypertension: Secondary | ICD-10-CM

## 2020-03-28 DIAGNOSIS — E039 Hypothyroidism, unspecified: Secondary | ICD-10-CM | POA: Diagnosis not present

## 2020-03-28 DIAGNOSIS — E119 Type 2 diabetes mellitus without complications: Secondary | ICD-10-CM

## 2020-03-28 LAB — LIPID PANEL
Cholesterol: 127 mg/dL (ref 0–200)
HDL: 35.6 mg/dL — ABNORMAL LOW (ref >39.00)
LDL Cholesterol: 69 mg/dL (ref 0–99)
NonHDL: 91.76
Total CHOL/HDL Ratio: 4
Triglycerides: 115 mg/dL (ref 0.0–149.0)
VLDL: 23 mg/dL (ref 0.0–40.0)

## 2020-03-28 LAB — HEMOGLOBIN A1C: Hgb A1c MFr Bld: 5.6 % (ref 4.6–6.5)

## 2020-03-28 LAB — TSH: TSH: 6.86 u[IU]/mL — ABNORMAL HIGH (ref 0.35–4.50)

## 2020-04-19 ENCOUNTER — Other Ambulatory Visit: Payer: Self-pay

## 2020-04-19 ENCOUNTER — Other Ambulatory Visit: Payer: Self-pay | Admitting: Internal Medicine

## 2020-04-19 ENCOUNTER — Other Ambulatory Visit (INDEPENDENT_AMBULATORY_CARE_PROVIDER_SITE_OTHER): Payer: 59

## 2020-04-19 DIAGNOSIS — E039 Hypothyroidism, unspecified: Secondary | ICD-10-CM | POA: Diagnosis not present

## 2020-04-19 LAB — TSH: TSH: 8.41 u[IU]/mL — ABNORMAL HIGH (ref 0.35–4.50)

## 2020-04-19 MED ORDER — SYNTHROID 88 MCG PO TABS: ORAL_TABLET | ORAL | 5 refills | Status: DC

## 2020-05-18 ENCOUNTER — Ambulatory Visit: Payer: 59 | Admitting: Internal Medicine

## 2020-06-24 ENCOUNTER — Other Ambulatory Visit: Payer: Self-pay | Admitting: Internal Medicine

## 2020-07-12 ENCOUNTER — Encounter: Payer: 59 | Admitting: Internal Medicine

## 2020-07-12 ENCOUNTER — Encounter: Payer: Self-pay | Admitting: Internal Medicine

## 2020-07-12 DIAGNOSIS — Z0289 Encounter for other administrative examinations: Secondary | ICD-10-CM

## 2020-07-12 NOTE — Progress Notes (Signed)
Subjective:    Patient ID: Adam Sullivan, male    DOB: 1956/02/12, 64 y.o.   MRN: [id number]  HPI The patient is here for follow up of their chronic medical problems, including htn, dm, hyperlipidemia, hypothyroidism, GERD, depression  He is taking all of his medications as prescribed.    He is exercising regularly.      ? Want psa  Medications and allergies reviewed with patient and updated if appropriate.  Patient Active Problem List   Diagnosis Date Noted  . History of CVA (cerebrovascular accident) 03/08/2020  . Fatigue 02/10/2020  . Memory loss 02/10/2020  . Depression 02/10/2020  . Tinea cruris 01/28/2020  . Left shoulder tendinitis 10/13/2018  . Unsteady 09/21/2018  . Poor short-term memory 09/21/2018  . Drooping eyelid disease, left 09/21/2018  . History of prostate cancer 09/23/2016  . Essential hypertension 09/23/2016  . Diabetes mellitus without complication ([place]) 22/97/9892  . Brain tumor (benign) ([place]) 06/04/2011  . GERD (gastroesophageal reflux disease) 06/04/2011  . Dyslipidemia 06/04/2011  . Headache disorder 06/04/2011  . Hypothyroidism 03/18/2008  . Asthma 03/18/2008    Current Outpatient Medications on File Prior to Visit  Medication Sig Dispense Refill  . ACETAMINOPHEN PO Take by mouth.    . ADVAIR DISKUS 250-50 MCG/DOSE AEPB USE 1 PUFF EVERY 12 HOURS. (Patient taking differently: Inhale 1 puff into the lungs every 12 (twelve) hours. ) 60 each 0  . albuterol (PROAIR HFA) 108 (90 Base) MCG/ACT inhaler USE @ PUFFS EVERY 6 HOURS AS NEEDED FOR WHEEZING 8.5 g 5  . aspirin 81 MG tablet Take 81 mg by mouth daily.    . Cetirizine HCl 10 MG CAPS Take by mouth.    . ezetimibe-simvastatin (VYTORIN) 10-40 MG tablet Take 1 tablet by mouth at bedtime. 90 tablet 1  . FLUoxetine (PROZAC) 20 MG tablet Take 1 tablet (20 mg total) by mouth daily. 30 tablet 3  . gabapentin (NEURONTIN) 300 MG capsule Take 300 mg by mouth 3 (three) times daily.      .  Hyprom-Naphaz-Polysorb-Zn Sulf (CLEAR EYES COMPLETE OP) Apply to eye.    Marland Kitchen lisinopril (ZESTRIL) 10 MG tablet Take 1 tablet (10 mg total) by mouth daily. 90 tablet 3  . montelukast (SINGULAIR) 10 MG tablet Take 1 tablet (10 mg total) by mouth at bedtime. 90 tablet 1  . Multiple Vitamin (MULTIVITAMIN) capsule Take 1 capsule by mouth daily.    . pantoprazole (PROTONIX) 40 MG tablet TAKE 1 TABLET ONCE DAILY. 30 tablet 0  . SYNTHROID 88 MCG tablet 1 tab daily 6 days a week, 2 tabs daily once a week 34 tablet 5  . tiZANidine (ZANAFLEX) 4 MG tablet Take 4 mg by mouth every 6 (six) hours as needed (headaches).      No current facility-administered medications on file prior to visit.    Past Medical History:  Diagnosis Date  . Allergy   . Anxiety   . Asthma    severe, Dr. Linna Darner  . Blood clots in brain    history of   . Brain tumor ([place]) 1993  . Depression    situational  . GERD (gastroesophageal reflux disease)    severe  . Headache(784.0)   . Hyperlipidemia   . Orchalgia   . Pneumonia    hx of  . Prostate cancer ([place]) 07/01/13   gleason 6  . Thyroid disease    hypothyroidism    Past Surgical History:  Procedure Laterality Date  . brain shunt  1993   put in, removed, put in again  . LYMPHADENECTOMY Bilateral 10/22/2013   Procedure: LYMPHADENECTOMY;  Surgeon: Alexis Frock, MD;  Location: [facility];  Service: Urology;  Laterality: Bilateral;  . 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 removed blood clot     X 2  . ROBOT ASSISTED LAPAROSCOPIC RADICAL PROSTATECTOMY N/A 10/22/2013   Procedure: ROBOTIC ASSISTED LAPAROSCOPIC RADICAL PROSTATECTOMY   PRE- PERITONEAL APPROACH;  Surgeon: Alexis Frock, MD;  Location: [facility];  Service: Urology;  Laterality: N/A;  3.5 HRS     Social History   Socioeconomic History  . Marital status: Married    Spouse name: Not on file  . Number of children: Not on file  . Years of education: Not on file  . Highest education level: Not on  file  Occupational History  . Not on file  Tobacco Use  . Smoking status: Never Smoker  . Smokeless tobacco: Never Used  Substance and Sexual Activity  . Alcohol use: Yes    Comment: Rarely  . Drug use: No  . Sexual activity: Not on file  Other Topics Concern  . Not on file  Social History Narrative   Exercise: none   Social Determinants of Health   Financial Resource Strain:   . Difficulty of Paying Living Expenses:   Food Insecurity:   . Worried About Charity fundraiser in the Last Year:   . Arboriculturist in the Last Year:   Transportation Needs:   . Film/video editor (Medical):   Marland Kitchen Lack of Transportation (Non-Medical):   Physical Activity:   . Days of Exercise per Week:   . Minutes of Exercise per Session:   Stress:   . Feeling of Stress :   Social Connections:   . Frequency of Communication with Friends and Family:   . Frequency of Social Gatherings with Friends and Family:   . Attends Religious Services:   . Active Member of Clubs or Organizations:   . Attends Archivist Meetings:   Marland Kitchen Marital Status:     Family History  Problem Relation Age of Onset  . Cancer Brother        anal cancer  . Asthma Maternal Uncle   . Cancer Father        prostate  cancer  . Testicular cancer Father   . Coronary artery disease Mother 61       5 vessel CABG  . Colon cancer Neg Hx   . Stomach cancer Neg Hx     Review of Systems     Objective:  There were no vitals filed for this visit. BP Readings from Last 3 Encounters:  02/21/20 (!) 144/86  01/28/20 (!) 146/80  11/19/19 118/80   Wt Readings from Last 3 Encounters:  02/21/20 173 lb (78.5 kg)  01/28/20 181 lb (82.1 kg)  11/19/19 188 lb (85.3 kg)   There is no height or weight on file to calculate BMI.   Physical Exam    Constitutional: Appears well-developed and well-nourished. No distress.  HENT:  Head: Normocephalic and atraumatic.  Neck: Neck supple. No tracheal deviation present. No  thyromegaly present.  No cervical lymphadenopathy Cardiovascular: Normal rate, regular rhythm and normal heart sounds.   No murmur heard. No carotid bruit .  No edema Pulmonary/Chest: Effort normal and breath sounds normal. No respiratory distress. No has no wheezes. No rales.  Skin: Skin is warm and dry. Not diaphoretic.  Psychiatric: Normal mood and affect. Behavior is normal.      Assessment & Plan:    See Problem List for Assessment and Plan of chronic medical problems.    This visit occurred during the SARS-CoV-2 public health emergency.  Safety protocols were in place, including screening questions prior to the visit, additional usage of staff PPE, and extensive cleaning of exam room while observing appropriate contact time as indicated for disinfecting solutions.    This encounter was created in error - please disregard.

## 2020-07-12 NOTE — Patient Instructions (Signed)
  Blood work was ordered.     Medications reviewed and updated.  Changes include :     Your prescription(s) have been submitted to your pharmacy. Please take as directed and contact our office if you believe you are having problem(s) with the medication(s).  A referral was ordered for        Someone from their office will call you to schedule an appointment.    Please followup in 6 months   

## 2020-08-14 ENCOUNTER — Other Ambulatory Visit: Payer: Self-pay | Admitting: Internal Medicine

## 2020-08-24 ENCOUNTER — Other Ambulatory Visit: Payer: Self-pay

## 2020-08-24 ENCOUNTER — Other Ambulatory Visit: Payer: 59

## 2020-08-24 DIAGNOSIS — Z20822 Contact with and (suspected) exposure to covid-19: Secondary | ICD-10-CM

## 2020-08-25 LAB — SARS-COV-2, NAA 2 DAY TAT

## 2020-08-25 LAB — NOVEL CORONAVIRUS, NAA: SARS-CoV-2, NAA: NOT DETECTED

## 2020-11-08 ENCOUNTER — Other Ambulatory Visit: Payer: Self-pay | Admitting: Internal Medicine

## 2020-12-31 IMAGING — CT CT HEAD WO/W CM
4 of 5 series · 16 of 47 positions shown, 18 images · IV contrast (OMNIPAQUE 300)
Comparison: 04/06/2019.  07/08/2008.

CLINICAL DATA: Memory loss. History of benign brain tumor of the
pineal region. Weakness.

EXAM:
CT HEAD WITHOUT AND WITH CONTRAST
TECHNIQUE: Contiguous axial images were obtained from the base of the skull
through the vertex without and with intravenous contrast
CONTRAST:  80mL OMNIPAQUE IOHEXOL 300 MG/ML  SOLN

[Series 3: head wo 5.0 hc40 · axial · 0.39mm/px · z∈[+194,+299]mm · 8 of 28 slices shown, 10 images]
[im 4/28  brain]
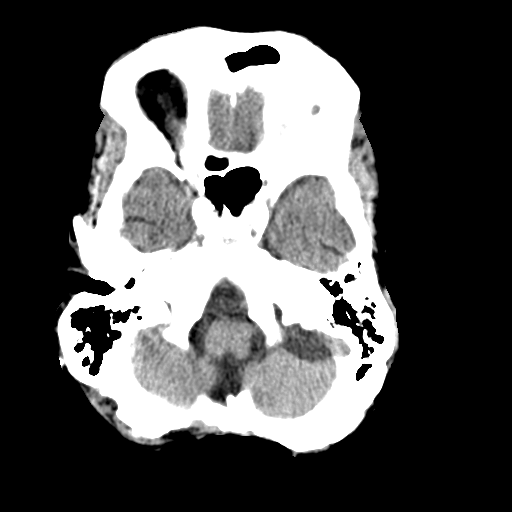
[im 4/28  bone]
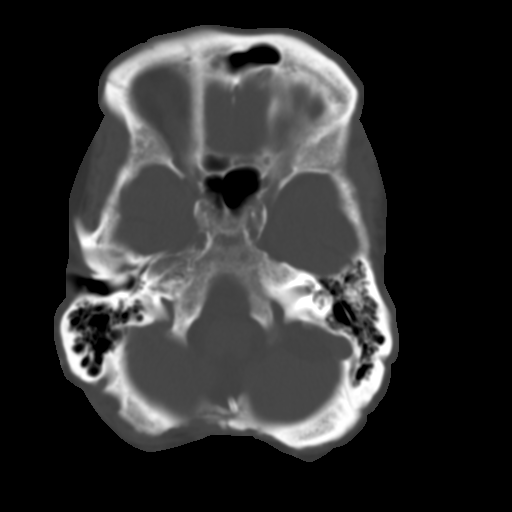
[im 7/28  brain]
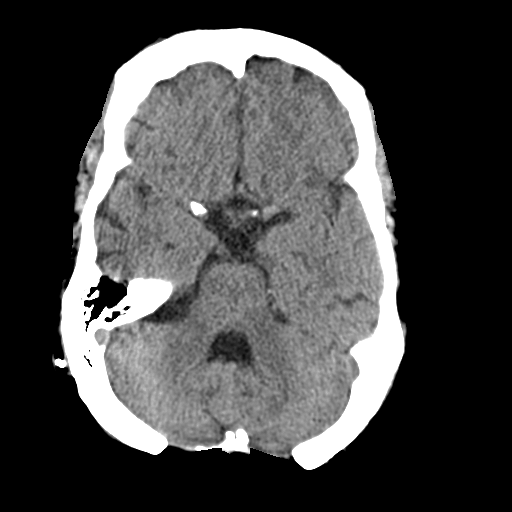
[im 10/28  brain]
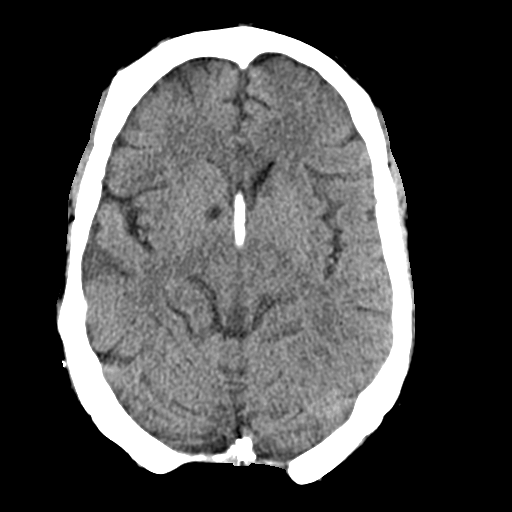
[im 13/28  brain]
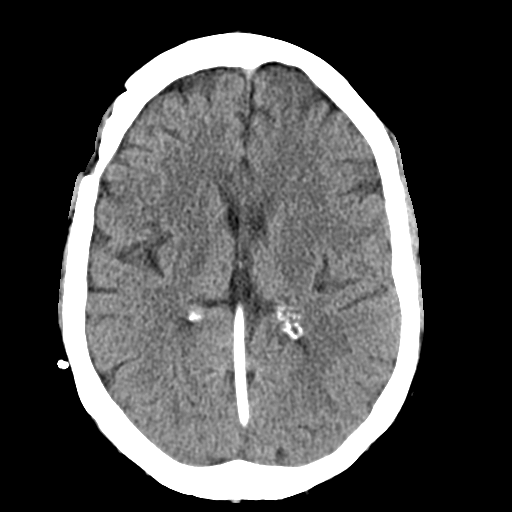
[im 16/28  brain]
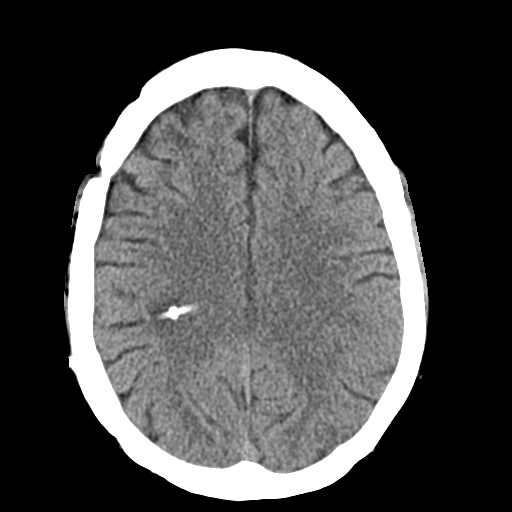
[im 16/28  bone]
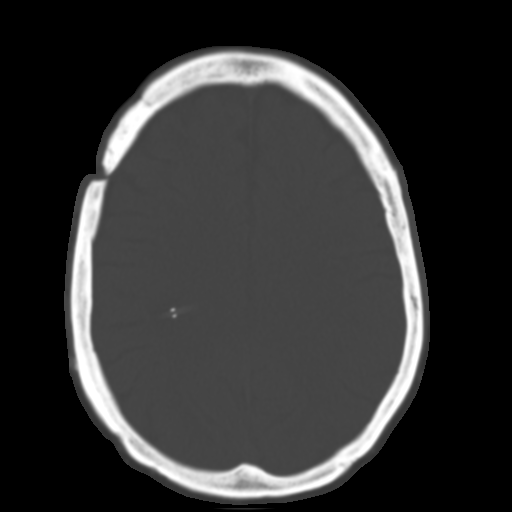
[im 19/28  brain]
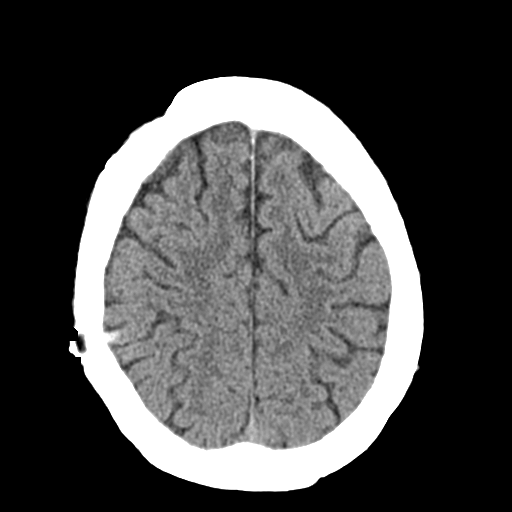
[im 22/28  brain]
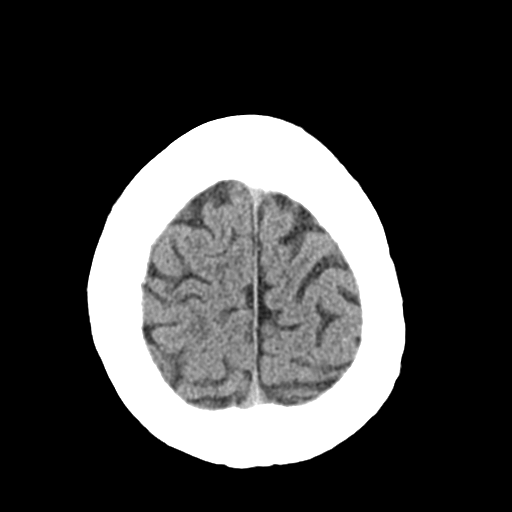
[im 25/28  brain]
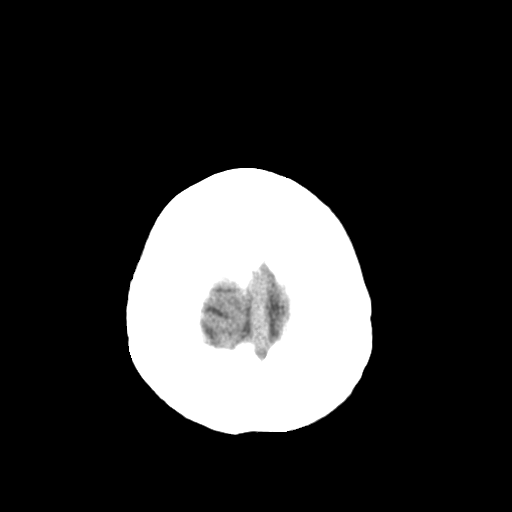

[Series 4: head wo 5.0 hr68 · axial · 0.39mm/px · z∈[+194,+209]mm · 2 of 28 slices shown]
[im 4/28  brain]
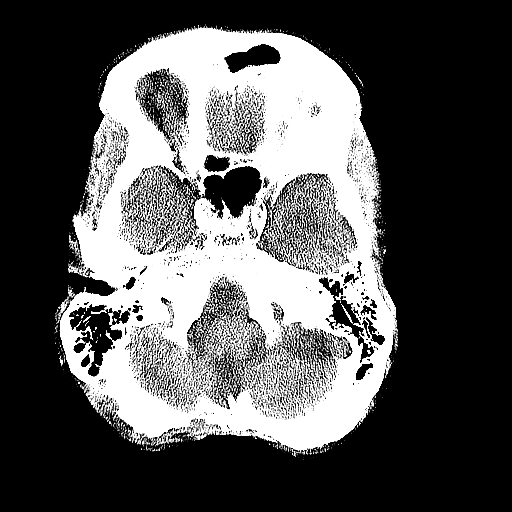
[im 7/28  brain]
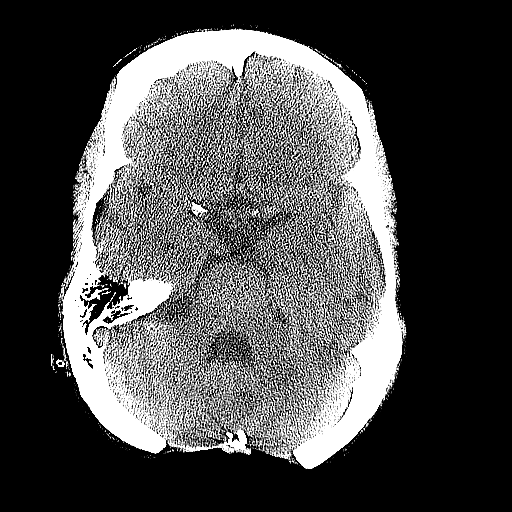

[Series 6: head with 3.0 mpr coronal · coronal · 0.29mm/px · 3 of 61 slices shown]
[im 21/61  brain]
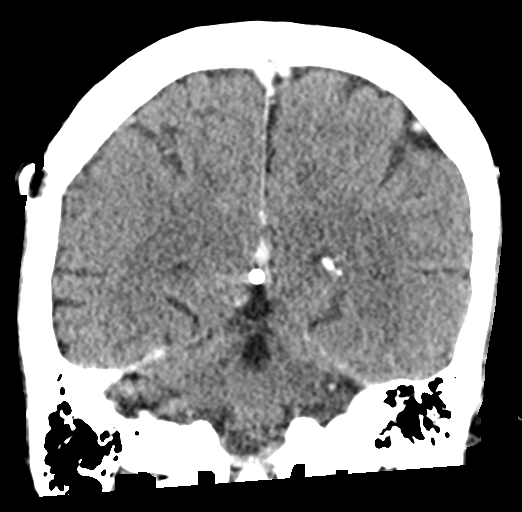
[im 27/61  brain]
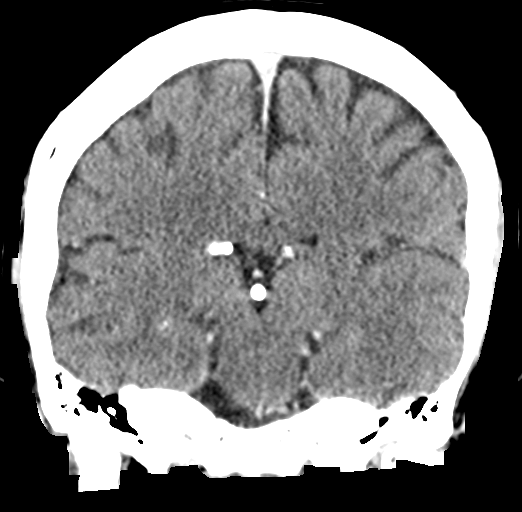
[im 34/61  brain]
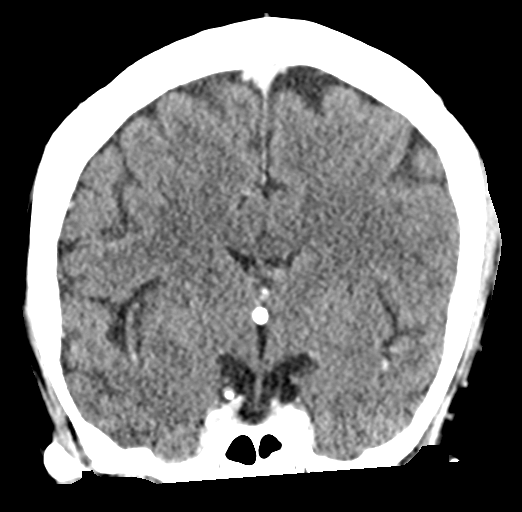

[Series 7: head with 3.0 mpr sagittal · sagittal · 0.29mm/px · 3 of 50 slices shown]
[im 17/50  brain]
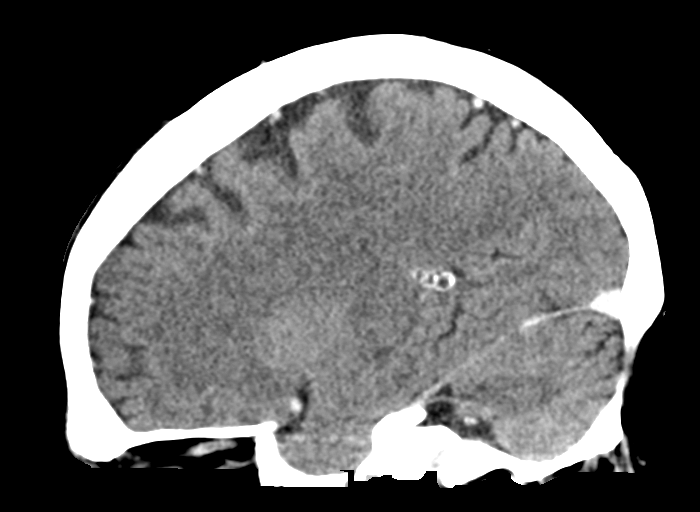
[im 25/50  brain]
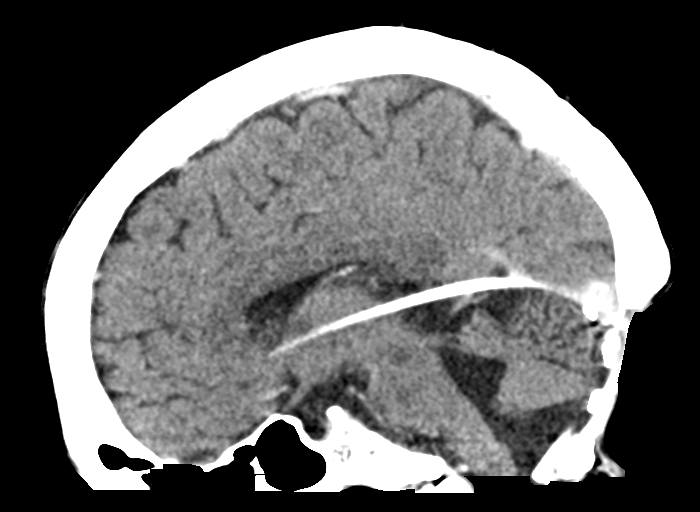
[im 33/50  brain]
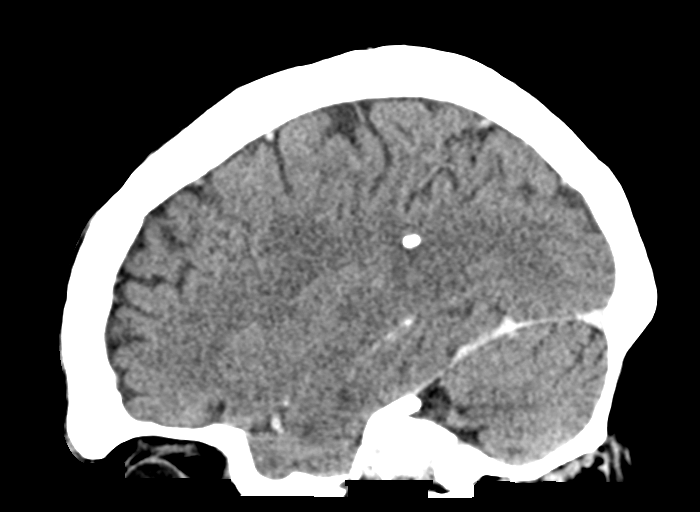

[16 of 47 positions shown; findings below may reference images not displayed]

FINDINGS: Brain: Chronic findings occipital craniectomy with mass resection
from the pineal region. Chronic midline catheter is unchanged. VP
shunt inserted from a right parietal region has its tip in the
atrium of the right lateral ventricle. Ventricular size remains
normal. No evidence of residual or recurrent mass lesion. No
abnormal contrast enhancement occurs.

Patient has newly seen small vessel infarctions in both basal
ganglia regions. There is chronic encephalomalacia in the right
frontal region consistent with an old right frontal infarction.
Extra-axial collection. No hemorrhage. No abnormal contrast
enhancement.

Vascular: There is atherosclerotic calcification of the major
vessels at the base of the brain.

Skull: Craniotomy changes.

Sinuses/Orbits: Clear/normal

Other: None
IMPRESSION: Newly seen but apparently old small vessel infarctions in both basal
ganglia regions. These were not present in Sunday March, 2019. Old right
frontal cortical and subcortical infarction.

Stable ventricular size.  No evidence of shunt malfunction.

No evidence of residual or recurrent tumor.

## 2021-01-02 ENCOUNTER — Other Ambulatory Visit: Payer: Self-pay | Admitting: Internal Medicine

## 2021-01-02 DIAGNOSIS — J4520 Mild intermittent asthma, uncomplicated: Secondary | ICD-10-CM
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
## 2021-05-20 NOTE — Patient Instructions (Addendum)
  Blood work was ordered.   A ct of your head was ordered.     Medications changes include :   Restart medications  Your prescription(s) have been submitted to your pharmacy. Please take as directed and contact our office if you believe you are having problem(s) with the medication(s).   A referral was ordered for [facility].        Someone from their office will call you to schedule an appointment.    Please followup in June

## 2021-05-21 ENCOUNTER — Other Ambulatory Visit: Payer: Self-pay

## 2021-05-21 ENCOUNTER — Ambulatory Visit: Payer: 59 | Admitting: Internal Medicine

## 2021-05-21 ENCOUNTER — Encounter: Payer: Self-pay | Admitting: Internal Medicine

## 2021-05-21 VITALS — BP 142/82 | HR 68 | Temp 98.4°F | Ht 66.0 in | Wt 188.8 lb

## 2021-05-21 DIAGNOSIS — R269 Unspecified abnormalities of gait and mobility: Secondary | ICD-10-CM

## 2021-05-21 DIAGNOSIS — E039 Hypothyroidism, unspecified: Secondary | ICD-10-CM | POA: Diagnosis not present

## 2021-05-21 DIAGNOSIS — I10 Essential (primary) hypertension: Secondary | ICD-10-CM

## 2021-05-21 DIAGNOSIS — R413 Other amnesia: Secondary | ICD-10-CM | POA: Diagnosis not present

## 2021-05-21 DIAGNOSIS — J4520 Mild intermittent asthma, uncomplicated: Secondary | ICD-10-CM

## 2021-05-21 DIAGNOSIS — Z8673 Personal history of transient ischemic attack (TIA), and cerebral infarction without residual deficits: Secondary | ICD-10-CM

## 2021-05-21 DIAGNOSIS — E119 Type 2 diabetes mellitus without complications: Secondary | ICD-10-CM

## 2021-05-21 DIAGNOSIS — K219 Gastro-esophageal reflux disease without esophagitis: Secondary | ICD-10-CM

## 2021-05-21 LAB — CBC WITH DIFFERENTIAL/PLATELET
Basophils Absolute: 0.1 10*3/uL (ref 0.0–0.1)
Basophils Relative: 1.2 % (ref 0.0–3.0)
Eosinophils Absolute: 0.5 10*3/uL (ref 0.0–0.7)
Eosinophils Relative: 8.5 % — ABNORMAL HIGH (ref 0.0–5.0)
HCT: 35.2 % — ABNORMAL LOW (ref 39.0–52.0)
Hemoglobin: 11.9 g/dL — ABNORMAL LOW (ref 13.0–17.0)
Lymphocytes Relative: 28.4 % (ref 12.0–46.0)
Lymphs Abs: 1.7 10*3/uL (ref 0.7–4.0)
MCHC: 33.9 g/dL (ref 30.0–36.0)
MCV: 86.6 fl (ref 78.0–100.0)
Monocytes Absolute: 0.6 10*3/uL (ref 0.1–1.0)
Monocytes Relative: 10.7 % (ref 3.0–12.0)
Neutro Abs: 3.1 10*3/uL (ref 1.4–7.7)
Neutrophils Relative %: 51.2 % (ref 43.0–77.0)
Platelets: 272 10*3/uL (ref 150.0–400.0)
RBC: 4.07 Mil/uL — ABNORMAL LOW (ref 4.22–5.81)
RDW: 15.2 % (ref 11.5–15.5)
WBC: 6 10*3/uL (ref 4.0–10.5)

## 2021-05-21 LAB — MICROALBUMIN / CREATININE URINE RATIO
Creatinine,U: 139.2 mg/dL
Microalb Creat Ratio: 5.8 mg/g (ref 0.0–30.0)
Microalb, Ur: 8 mg/dL — ABNORMAL HIGH (ref 0.0–1.9)

## 2021-05-21 LAB — HEMOGLOBIN A1C: Hgb A1c MFr Bld: 5.9 % (ref 4.6–6.5)

## 2021-05-21 LAB — VITAMIN B12: Vitamin B-12: 204 pg/mL — ABNORMAL LOW (ref 211–911)

## 2021-05-21 LAB — TSH: TSH: 9.44 u[IU]/mL — ABNORMAL HIGH (ref 0.35–4.50)

## 2021-05-21 MED ORDER — PANTOPRAZOLE SODIUM 40 MG PO TBEC: 1.0000 | DELAYED_RELEASE_TABLET | Freq: Every day | ORAL | 1 refills | Status: AC

## 2021-05-21 MED ORDER — LEVOTHYROXINE SODIUM 88 MCG PO TABS: ORAL_TABLET | ORAL | 1 refills | Status: DC

## 2021-05-21 MED ORDER — MONTELUKAST SODIUM 10 MG PO TABS: 1.0000 | ORAL_TABLET | Freq: Every day | ORAL | 1 refills | Status: AC

## 2021-05-21 MED ORDER — LISINOPRIL 10 MG PO TABS: 10.0000 mg | ORAL_TABLET | Freq: Every day | ORAL | 1 refills | Status: DC

## 2021-05-21 MED ORDER — EZETIMIBE-SIMVASTATIN 10-40 MG PO TABS: 1.0000 | ORAL_TABLET | Freq: Every day | ORAL | 1 refills | Status: AC

## 2021-05-21 NOTE — Assessment & Plan Note (Signed)
Chronic Experiencing GERD few times a week Restart pantoprazole 40 mg daily

## 2021-05-21 NOTE — Assessment & Plan Note (Signed)
Chronic Has not been taking his thyroid medication Check TSH Restart levothyroxine 88 mcg daily

## 2021-05-21 NOTE — Assessment & Plan Note (Signed)
Chronic Has not been taking his medication Blood pressure is slightly elevated here, but likely is elevated more intermittently Restart lisinopril 10 mg daily Follow-up early June before leaving town

## 2021-05-21 NOTE — Assessment & Plan Note (Signed)
Acute on chronic He has always had some difficulty with his gait and poor balance, but this has worsened Possibly multifactorial-he is very sedentary, but concerned over dysfunction of VP shunt and possible hydrocephalus-CT of the head ordered

## 2021-05-21 NOTE — Assessment & Plan Note (Signed)
History of stroke Has not been taking any medication Restart statin, blood pressure medication CT of the head to rule out new stroke

## 2021-05-21 NOTE — Assessment & Plan Note (Signed)
Chronic Had diabetes and did improve his sugars with lifestyle The past few months he has not been eating well and has not been exercising Most likely sugars back in diabetic range Check A1c Stressed diabetic diet and increased activity

## 2021-05-21 NOTE — Assessment & Plan Note (Signed)
Chronic He does have wheezing and occasionally shortness of breath He states he is taking the Advair twice daily and using the albuterol about twice a week-his wife confirmed that Restart Singulair 10 mg nightly

## 2021-05-21 NOTE — Assessment & Plan Note (Signed)
Chronic Over a year ago there was some memory issues and we had discussed this at that time and I had referred him to neurology who he did not get to see-his memory has gotten and his behavioral is not normal, his gait has changed Concern regarding onset of dementia, related to untreated hypothyroidism, onset of Parkinson's disease, dysfunction of the shunt and possible hydrocephalus, stroke, which she has had in the past CT of the head stat TSH, A1c, CBC, CMP, lipids Refer to neurology and neuropsychology May need to see neurosurgery depending on if patient has dysfunction.  He has a manual button in his head that he is supposed to push every 3 days and most likely has not been doing that.  If this is the case it may explain his gait, incontinence at night and memory issues

## 2021-05-22 LAB — COMPREHENSIVE METABOLIC PANEL
ALT: 8 U/L (ref 0–53)
AST: 13 U/L (ref 0–37)
Albumin: 4 g/dL (ref 3.5–5.2)
Alkaline Phosphatase: 43 U/L (ref 39–117)
BUN: 17 mg/dL (ref 6–23)
CO2: 28 mEq/L (ref 19–32)
Calcium: 8.8 mg/dL (ref 8.4–10.5)
Chloride: 98 mEq/L (ref 96–112)
Creatinine, Ser: 0.91 mg/dL (ref 0.40–1.50)
GFR: 90.1 mL/min (ref >60.00)
Glucose, Bld: 90 mg/dL (ref 70–99)
Potassium: 3.7 mEq/L (ref 3.5–5.1)
Sodium: 135 mEq/L (ref 135–145)
Total Bilirubin: 0.3 mg/dL (ref 0.2–1.2)
Total Protein: 7.3 g/dL (ref 6.0–8.3)

## 2021-05-22 LAB — LIPID PANEL
Cholesterol: 185 mg/dL (ref 0–200)
HDL: 35.7 mg/dL — ABNORMAL LOW (ref >39.00)
LDL Cholesterol: 116 mg/dL — ABNORMAL HIGH (ref 0–99)
NonHDL: 149.02
Total CHOL/HDL Ratio: 5
Triglycerides: 165 mg/dL — ABNORMAL HIGH (ref 0.0–149.0)
VLDL: 33 mg/dL (ref 0.0–40.0)

## 2021-05-23 ENCOUNTER — Ambulatory Visit (INDEPENDENT_AMBULATORY_CARE_PROVIDER_SITE_OTHER)
Admission: RE | Admit: 2021-05-23 | Discharge: 2021-05-23 | Disposition: A | Discharge status: Home / Self Care | Payer: 59 | Source: Ambulatory Visit | Attending: Internal Medicine | Admitting: Internal Medicine

## 2021-05-23 ENCOUNTER — Telehealth: Payer: Self-pay

## 2021-05-23 ENCOUNTER — Other Ambulatory Visit: Payer: Self-pay

## 2021-05-23 DIAGNOSIS — G9389 Other specified disorders of brain: Secondary | ICD-10-CM

## 2021-05-23 DIAGNOSIS — R413 Other amnesia: Secondary | ICD-10-CM

## 2021-05-23 NOTE — Telephone Encounter (Signed)
Called him and his wife - reviewed labs and Ct scan.    MRI w/wo contrast ordered.   Referral ordered for neurosurgery

## 2021-05-24 ENCOUNTER — Encounter: Payer: Self-pay | Admitting: Internal Medicine

## 2021-05-24 ENCOUNTER — Ambulatory Visit
Admission: RE | Admit: 2021-05-24 | Discharge: 2021-05-24 | Disposition: A | Discharge status: Home / Self Care | Payer: 59 | Source: Ambulatory Visit | Attending: Internal Medicine | Admitting: Internal Medicine

## 2021-05-24 DIAGNOSIS — G9389 Other specified disorders of brain: Secondary | ICD-10-CM

## 2021-05-24 MED ORDER — GADOBENATE DIMEGLUMINE 529 MG/ML IV SOLN
17.0000 mL | Freq: Once | INTRAVENOUS | Status: AC | PRN
  Administered 2021-05-24: 17 mL via INTRAVENOUS

## 2021-05-25 ENCOUNTER — Encounter: Payer: Self-pay | Admitting: Internal Medicine

## 2021-05-25 ENCOUNTER — Other Ambulatory Visit: Payer: Self-pay | Admitting: Internal Medicine

## 2021-05-25 DIAGNOSIS — G9389 Other specified disorders of brain: Secondary | ICD-10-CM

## 2021-05-29 ENCOUNTER — Other Ambulatory Visit: Payer: Self-pay | Admitting: Radiation Therapy

## 2021-05-29 ENCOUNTER — Encounter: Payer: Self-pay | Admitting: Internal Medicine

## 2021-05-29 DIAGNOSIS — M7989 Other specified soft tissue disorders: Secondary | ICD-10-CM

## 2021-05-30 ENCOUNTER — Other Ambulatory Visit: Payer: Self-pay

## 2021-05-30 ENCOUNTER — Telehealth: Payer: Self-pay

## 2021-05-30 ENCOUNTER — Telehealth: Payer: Self-pay | Admitting: Internal Medicine

## 2021-05-30 ENCOUNTER — Ambulatory Visit (HOSPITAL_COMMUNITY)
Admission: RE | Admit: 2021-05-30 | Discharge: 2021-05-30 | Disposition: A | Discharge status: Home / Self Care | Payer: 59 | Source: Ambulatory Visit | Attending: Internal Medicine | Admitting: Internal Medicine

## 2021-05-30 DIAGNOSIS — M7989 Other specified soft tissue disorders: Secondary | ICD-10-CM | POA: Diagnosis not present

## 2021-05-30 NOTE — Telephone Encounter (Signed)
Follow up message   Swelling only No numbness, no pain Cant really tell if redness is new   Please return call

## 2021-05-30 NOTE — Telephone Encounter (Signed)
Please call and let them know ultrasound was negative for blood clot.  Advise that Adam Sullivan elevate the leg when sitting.  If there is no improvement Adam Sullivan may need to come in so I can evaluate it in person.  I see that Adam Sullivan sees Dr. Mickeal Skinner tomorrow.  Does Adam Sullivan have an appointment with neurosurgery?

## 2021-05-30 NOTE — Telephone Encounter (Signed)
Received a new pt referral from Dr. Quay Burow for a brain mass. Adam Sullivan has been scheduled to see Dr. Mickeal Skinner on 6/2 at [facility] [place] date and time has been given to the pt's wife. Aware to arrive 15 minutes early.

## 2021-05-30 NOTE — Telephone Encounter (Signed)
Negative for DVT & superficial thrombosis

## 2021-05-31 ENCOUNTER — Inpatient Hospital Stay: Payer: 59 | Attending: Internal Medicine | Admitting: Internal Medicine

## 2021-05-31 DIAGNOSIS — G9389 Other specified disorders of brain: Secondary | ICD-10-CM | POA: Diagnosis present

## 2021-05-31 DIAGNOSIS — C719 Malignant neoplasm of brain, unspecified: Secondary | ICD-10-CM | POA: Insufficient documentation

## 2021-05-31 NOTE — Progress Notes (Signed)
[facility] [place], [place] [address] [phone]   New Patient Evaluation  Date of Service: 05/31/21 Patient Name: Adam Sullivan Patient MRN: [id number] Patient DOB: 1956-08-04 Provider: Ventura Sellers, MD  Identifying Statement:  Adam Sullivan is a 65 y.o. male with right frontal and brainstem multifocal mass who presents for initial consultation and evaluation.    Referring Provider: Binnie Rail, MD [place],  [place] [address]  Oncologic History: 1993 or 88: Pineal mass resection, VP shunt placed.  Post-operative radiation administered. 05/24/21: Brain MRI demonstrates multi-focal enhancing masses; R caudate, capsule, midbrain  Biomarkers:  MGMT Unknown.  IDH 1/2 Unknown.  EGFR Unknown  TERT Unknown   History of Present Illness: The patient's records from the referring physician were obtained and reviewed and the patient interviewed to confirm this HPI.  Adam Sullivan presented to medical attention this past month when family noticed change in behavior and mentation.  They describe impairment in short term memory, odd/unusual answers to questions at times.  They also describe inattention to physical appearance, cleanliness.  This is a clear change from prior, last time at baseline was 2-3 months ago.  Patient acknowledges some changes but doesn't have any specific complaints.  PCP ordered brain MRI study which demonstrated abnormal findings.  His history includes pineal mass (unknown histology), with craniotomy, VP shunt placement and post-operative radiation therapy given in the early 1990's.  At baseline he has some double vision, slurred speech from those interventions.  Is retired from [employer] system in [place].  Medications: Current Outpatient Medications on File Prior to Visit  Medication Sig Dispense Refill  . ADVAIR DISKUS 250-50 MCG/DOSE AEPB USE 1 PUFF EVERY 12 HOURS. (Patient taking  differently: Inhale 1 puff into the lungs every 12 (twelve) hours.) 60 each 0  . albuterol (PROAIR HFA) 108 (90 Base) MCG/ACT inhaler USE @ PUFFS EVERY 6 HOURS AS NEEDED FOR WHEEZING 8.5 g 5  . aspirin 81 MG tablet Take 81 mg by mouth daily.    . Cetirizine HCl 10 MG CAPS Take by mouth.    . ezetimibe-simvastatin (VYTORIN) 10-40 MG tablet Take 1 tablet by mouth at bedtime. 90 tablet 1  . levothyroxine (SYNTHROID) 88 MCG tablet TAKE 1 TABLET ONCE DAILY IN THE MORNING BEFORE BREAKFAST. 90 tablet 1  . lisinopril (ZESTRIL) 10 MG tablet Take 1 tablet (10 mg total) by mouth daily. 90 tablet 1  . montelukast (SINGULAIR) 10 MG tablet Take 1 tablet (10 mg total) by mouth at bedtime. 90 tablet 1  . pantoprazole (PROTONIX) 40 MG tablet Take 1 tablet (40 mg total) by mouth daily. 90 tablet 1   No current facility-administered medications on file prior to visit.    Allergies:  Allergies  Allergen Reactions  . Chocolate   . Dust Mite Extract   . Erythromycin Other (See Comments)    Pt unsure of what the reaction was. Happen as a child.   Adam Sullivan    Past Medical History:  Past Medical History:  Diagnosis Date  . Allergy   . Anxiety   . Asthma    severe, Dr. Linna Darner  . Blood clots in brain    history of   . Brain tumor ([facility]) 1993  . Depression    situational  . GERD (gastroesophageal reflux disease)    severe  . Headache(784.0)   . Hyperlipidemia   . Orchalgia   . Pneumonia  hx of  . Prostate cancer ([facility]) 07/01/13   gleason 6  . Thyroid disease    hypothyroidism   Past Surgical History:  Past Surgical History:  Procedure Laterality Date  . brain shunt  1993   put in, removed, put in again  . LYMPHADENECTOMY Bilateral 10/22/2013   Procedure: LYMPHADENECTOMY;  Surgeon: Alexis Frock, MD;  Location: [facility];  Service: Urology;  Laterality: Bilateral;  . 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 removed blood clot     X 2  . ROBOT ASSISTED LAPAROSCOPIC RADICAL  PROSTATECTOMY N/A 10/22/2013   Procedure: ROBOTIC ASSISTED LAPAROSCOPIC RADICAL PROSTATECTOMY   PRE- PERITONEAL APPROACH;  Surgeon: Alexis Frock, MD;  Location: [facility];  Service: Urology;  Laterality: N/A;  3.5 HRS    Social History:  Social History   Socioeconomic History  . Marital status: Married    Spouse name: Not on file  . Number of children: Not on file  . Years of education: Not on file  . Highest education level: Not on file  Occupational History  . Not on file  Tobacco Use  . Smoking status: Never Smoker  . Smokeless tobacco: Never Used  Substance and Sexual Activity  . Alcohol use: Yes    Comment: Rarely  . Drug use: No  . Sexual activity: Not on file  Other Topics Concern  . Not on file  Social History Narrative   Exercise: none   Social Determinants of Health   Financial Resource Strain: Not on file  Food Insecurity: Not on file  Transportation Needs: Not on file  Physical Activity: Not on file  Stress: Not on file  Social Connections: Not on file  Intimate Partner Violence: Not on file   Family History:  Family History  Problem Relation Age of Onset  . Cancer Brother        anal cancer  . Asthma Maternal Uncle   . Cancer Father        prostate  cancer  . Testicular cancer Father   . Coronary artery disease Mother 10       5 vessel CABG  . Colon cancer Neg Hx   . Stomach cancer Neg Hx     Review of Systems: Constitutional: Doesn't report fevers, chills or abnormal weight loss Eyes: Doesn't report blurriness of vision Ears, nose, mouth, throat, and face: Doesn't report sore throat Respiratory: Doesn't report cough, dyspnea or wheezes Cardiovascular: Doesn't report palpitation, chest discomfort  Gastrointestinal:  Doesn't report nausea, constipation, diarrhea GU: Doesn't report incontinence Skin: Doesn't report skin rashes Neurological: Per HPI Musculoskeletal: Doesn't report joint pain Behavioral/Psych: Doesn't report anxiety  Physical  Exam: Vitals:   05/31/21 0902  BP: (!) 165/82  Pulse: 71  Resp: 18  Temp: (!) 96.6 F (35.9 C)  SpO2: 99%   KPS: 90. General: Alert, cooperative, pleasant, in no acute distress Head: Normal EENT: No conjunctival injection or scleral icterus.  Lungs: Resp effort normal Cardiac: Regular rate Abdomen: Non-distended abdomen Skin: No rashes cyanosis or petechiae. Extremities: No clubbing or edema  Neurologic Exam: Mental Status: Awake, alert, attentive to examiner. Oriented to self and environment. Language is fluent with intact comprehension.  Elements of agnosia, poor insight. Cranial Nerves: Visual acuity is grossly normal. Visual fields are full. Extra-ocular movements intact with vertical diplopia. L eyelid ptotic. Face is symmetric Motor: Tone and bulk are normal. Power is full in both arms and legs. Reflexes are symmetric, no pathologic reflexes present.  Sensory: Intact to light touch Gait: Normal.   Labs: I have reviewed the data as listed    Component Value Date/Time   NA 135 05/21/2021 1603   K 3.7 05/21/2021 1603   CL 98 05/21/2021 1603   CO2 28 05/21/2021 1603   GLUCOSE 90 05/21/2021 1603   BUN 17 05/21/2021 1603   CREATININE 0.91 05/21/2021 1603   CALCIUM 8.8 05/21/2021 1603   PROT 7.3 05/21/2021 1603   ALBUMIN 4.0 05/21/2021 1603   AST 13 05/21/2021 1603   ALT 8 05/21/2021 1603   ALKPHOS 43 05/21/2021 1603   BILITOT 0.3 05/21/2021 1603   GFRNONAA >90 10/23/2013 0520   GFRAA >90 10/23/2013 0520   Lab Results  Component Value Date   WBC 6.0 05/21/2021   NEUTROABS 3.1 05/21/2021   HGB 11.9 (L) 05/21/2021   HCT 35.2 (L) 05/21/2021   MCV 86.6 05/21/2021   PLT 272.0 05/21/2021    Imaging:  CT Head Wo Contrast  Result Date: 05/23/2021 CLINICAL DATA:  Memory loss.  History of ventriculoperitoneal shunt. EXAM: CT HEAD WITHOUT CONTRAST TECHNIQUE: Contiguous axial images were obtained from the base of the skull through the vertex without intravenous  contrast. COMPARISON:  February 16, 2020. FINDINGS: Brain: Stable position of midline shunt catheter compared to prior exam. Stable position of right posterior parietal ventriculoperitoneal shunt is noted with tip in right lateral ventricle. Ventricular size is within normal limits. No definite hemorrhage is noted. Old lacunar infarction is again noted in the right basal ganglia. However, there does appear to be the interval development of probable solid mass measuring 2.5 x 1.6 cm adjacent to the right frontal horn. Vascular: No hyperdense vessel or unexpected calcification. Skull: Stable appearance occipital craniotomy. No acute fracture is noted. Sinuses/Orbits: No acute finding. Other: None. IMPRESSION: Interval development of probable 2.5 x 1.6 cm solid mass adjacent to the right frontal horn. MRI with and without gadolinium administration is recommended for further evaluation. These results will be called to the ordering clinician or representative by the Radiologist Assistant, and communication documented in the PACS or zVision Dashboard. Stable position of midline drainage catheter as well as right posterior parietal ventriculoperitoneal catheter. No ventricular enlargement is noted. Electronically Signed   By: Marijo Conception M.D.   On: 05/23/2021 15:40   MR Brain W Wo Contrast  Result Date: 05/24/2021 CLINICAL DATA:  Brain mass. Imbalance, confusion, and lethargy. Remote history of pineal region tumor. EXAM: MRI HEAD WITHOUT AND WITH CONTRAST TECHNIQUE: Multiplanar, multiecho pulse sequences of the brain and surrounding structures were obtained without and with intravenous contrast. CONTRAST:  68m MULTIHANCE GADOBENATE DIMEGLUMINE 529 MG/ML IV SOLN COMPARISON:  Head CT 05/23/2021 FINDINGS: Brain: There is a 2 cm heterogeneously T2 hyperintense, heterogeneously enhancing mass centered in the region of the right caudate head with effacement of the frontal horn of the right lateral ventricle. The mass  demonstrates hyperintense diffusion weighted signal and is also mildly hyperattenuating on CT suggesting hypercellularity. There are 2 adjacent punctate foci of satellite enhancement (series 15, images 80 and 94). There is also a 1 cm peripherally enhancing lesion in the posterior limb of the right internal capsule, and there is a 4 mm solidly enhancing lesion in the right pons. A right parietal approach ventriculostomy catheter is again noted terminating in the atrium of the right lateral ventricle, and there is a midline occipital approach catheter which courses through the third ventricle and terminates between the frontal horns of the lateral ventricles. There is  no evidence of hydrocephalus. An old ventriculostomy catheter tract is noted in the right frontal lobe. There are chronic hemorrhagic lacunar infarcts in the basal ganglia bilaterally. Chronic microhemorrhages are also noted in the thalami and elsewhere in the cerebrum and cerebellum as well as brainstem. Patchy T2 hyperintensity in the pons is nonspecific but suggestive of chronic small vessel ischemia. Vascular: Major intracranial vascular flow voids are preserved. Skull and upper cervical spine: Suboccipital craniectomy. Right frontal craniotomy. No suspicious marrow lesion. Sinuses/Orbits: Unremarkable orbits. Mild mucosal thickening in the paranasal sinuses. Clear mastoid air cells. Other: None. IMPRESSION: 1. 2 cm enhancing mass in the right caudate region most concerning for neoplasm (glioma, lymphoma, or metastasis) with infection considered less likely. 2. Smaller enhancing lesions in the right internal capsule and pons also concerning for additional sites of neoplasm or infection, less likely subacute infarcts. 3. Chronic ischemia with multiple old infarcts and chronic hemorrhages as above. Electronically Signed   By: Logan Bores M.D.   On: 05/24/2021 19:35   VAS [place] LOWER EXTREMITY VENOUS (DVT)  Result Date: 05/30/2021  Lower Venous DVT  Study Patient Name:  Adam Sullivan  Date of Exam:   05/30/2021 Medical Rec #: [id number]     Accession #:    [id number] Date of Birth: May 15, 1956      Patient Gender: M Patient Age:   64Y Exam Location:  [facility] Procedure:      VAS [place] LOWER EXTREMITY VENOUS (DVT) Referring Phys: [id number] STACY J BURNS --------------------------------------------------------------------------------  Indications: Swelling. Other Indications: Recent travel. Recent diagnosis brain tumor. Performing Technologist: Ralene Cork RVT  Examination Guidelines: A complete evaluation includes B-mode imaging, spectral Doppler, color Doppler, and power Doppler as needed of all accessible portions of each vessel. Bilateral testing is considered an integral part of a complete examination. Limited examinations for reoccurring indications may be performed as noted. The reflux portion of the exam is performed with the patient in reverse Trendelenburg.  +---------+---------------+---------+-----------+----------+--------------+ LEFT     CompressibilityPhasicitySpontaneityPropertiesThrombus Aging +---------+---------------+---------+-----------+----------+--------------+ CFV      Full           Yes      Yes                                 +---------+---------------+---------+-----------+----------+--------------+ SFJ      Full                    Yes                                 +---------+---------------+---------+-----------+----------+--------------+ FV Prox  Full           Yes      Yes                                 +---------+---------------+---------+-----------+----------+--------------+ FV Mid   Full           Yes      Yes                                 +---------+---------------+---------+-----------+----------+--------------+ FV DistalFull           Yes      Yes                                 +---------+---------------+---------+-----------+----------+--------------+  POP       Full           Yes      Yes                                 +---------+---------------+---------+-----------+----------+--------------+ PTV      Full                    Yes                                 +---------+---------------+---------+-----------+----------+--------------+ PERO     Full                    Yes                                 +---------+---------------+---------+-----------+----------+--------------+ GSV      Full           Yes      Yes                                 +---------+---------------+---------+-----------+----------+--------------+   Findings reported to [place] at 4:20 pm.  Summary: LEFT: - There is no evidence of deep vein thrombosis in the lower extremity. - There is no evidence of superficial venous thrombosis.  *See table(s) above for measurements and observations. Electronically signed by Deitra Mayo MD on 05/30/2021 at 4:20:40 PM.    Final     Assessment/Plan Brain mass  Adam Sullivan presents with clinical and radiographic syndrome localizing to the right hemisphere and deeper structures.  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  DWI bright signal heightens suspicion for primary CNS lymphoma vs more common high grade glioma.  We recommended consultation with neurosurgery ASAP for likely stereotactic biopsy.  He will not be candidate for tumor resection.   We did not recommend dosing corticosteroids, as this can interfere with yield for histology if lymphoma is present.    Goals of care in brain tumors was discussed; they understand these conversations are limited until a diagnosis is obtained formally.  Will continue to follow closely following likely surgery.    We appreciate the opportunity to participate in the care of Adam Sullivan.   Screening for potential clinical trials was performed and discussed using eligibility criteria for active protocols at [facility], loco-regional tertiary  centers, as well as national database available on [email or web address].    The patient is not a candidate for a research protocol at this time due to no suitable study identified.   We spent twenty additional minutes teaching regarding the natural history, biology, and historical experience in the treatment of brain tumors. We then discussed in detail the current recommendations for therapy focusing on the mode of administration, mechanism of action, anticipated toxicities, and quality of life issues associated with this plan. We also provided teaching sheets for the patient to take home as an additional resource.  All questions were answered. The patient knows to call the clinic with any problems, questions or concerns. No barriers to learning were detected.  The total time spent in the encounter was 60 minutes and more than 50% was on counseling and review of test results  Ventura Sellers, MD Medical Director of Neuro-Oncology [facility] [place] 05/31/21 3:53 PM

## 2021-06-04 ENCOUNTER — Inpatient Hospital Stay: Payer: 59

## 2021-06-05 ENCOUNTER — Inpatient Hospital Stay (HOSPITAL_BASED_OUTPATIENT_CLINIC_OR_DEPARTMENT_OTHER): Payer: 59 | Admitting: Internal Medicine

## 2021-06-05 DIAGNOSIS — G9389 Other specified disorders of brain: Secondary | ICD-10-CM | POA: Diagnosis not present

## 2021-06-05 DIAGNOSIS — Z862 Personal history of diseases of the blood and blood-forming organs and certain disorders involving the immune mechanism: Secondary | ICD-10-CM

## 2021-06-05 NOTE — Progress Notes (Signed)
I connected with Adam Sullivan on 06/05/21 at  2:00 PM EDT by telephone visit and verified that I am speaking with the correct person using two identifiers.  I discussed the limitations, risks, security and privacy concerns of performing an evaluation and management service by telemedicine and the availability of in-person appointments. I also discussed with the patient that there may be a patient responsible charge related to this service. The patient expressed understanding and agreed to proceed.  Other persons participating in the visit and their role in the encounter:  spouse  Patient's location:  Home  Provider's location:  Office  Chief Complaint:  Brain mass  History of Present Ilness: We reached out today to discuss new information that has come to light regarding patient's history of pineal mass and its subsequent treatment back in 1993.  At the time of resection, he developed severe post-operative hemorrhage, which then recurred following placement of shunt catheter.  In addition, MRI demonstrates evidence of multiple intraparenchymal hemorrhage and micro-hemorrhage. He underwent extensive hematologic workup at the time, but we do not have records of that unfortunately.  Although he did receive radiation (at [facility]) it is still unclear the type of RT or the dose and coverage delivered.  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  Met with Dr. Marcello Moores yesterday who expressed some reservation about brain biopsy without further workup given this history.     Observations: Lanuage and cognition at prior baseline  Assessment and Plan: Brain mass  Recommended the following: -ASAP CT chest/abdomen/pelvis to rule out systemic source, possible biopsy target outside CNS -Curbside heme for baseline hematologic workup given history of frequent CNS hemorrhage. -Will consider repeat brain MRI for the end of the month, if workup/biopsy not completed by that time.  Follow Up Instructions: Will follow up  via phone with patient and Dr. Marcello Moores following CT scan results  I discussed the assessment and treatment plan with the patient.  The patient was provided an opportunity to ask questions and all were answered.  The patient agreed with the plan and demonstrated understanding of the instructions.    The patient was advised to call back or seek an in-person evaluation if the symptoms worsen or if the condition fails to improve as anticipated.  I provided 5-10 minutes of non-face-to-face time during this enocunter.  Ventura Sellers, MD   I provided 15 minutes of non face-to-face telephone visit time during this encounter, and > 50% was spent counseling as documented under my assessment & plan.

## 2021-06-07 ENCOUNTER — Other Ambulatory Visit: Payer: Self-pay

## 2021-06-07 NOTE — Progress Notes (Signed)
Subjective:    Patient ID: Adam Sullivan, male    DOB: [birth year], 65 y.o.   MRN: [id number]  HPI The patient is here for follow up brain masses, low B12 and hypothyroidism.    He just saw Dr Mickeal Skinner.  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   He will have a CT C/AP. 6/13  Neurosurgery concerned about bx given hx of intraparenchymal hemorrhage and microhemorrhage in past with tumor removed and shunt placement.  Repeat MRI later this month.  Will see hem/onc regarding his blood clotting with prior brain surgery.   He has forgotten to take some of his medication some days - his wife reminds him but when she was out of town for three days he forget even after she reminded him.  He drove to the store to get three things and did not return for over an hour - she called him and he was sleeping in his car.  That is not new.  He is still  sleeping a lot. His wife worries about him driving.    His LLE is more swollen than his RLE.  No improvement.    Medications and allergies reviewed with patient and updated if appropriate.  Patient Active Problem List   Diagnosis Date Noted   Brain mass 05/31/2021   History of CVA (cerebrovascular accident) 03/08/2020   Fatigue 02/10/2020   Memory loss 02/10/2020   Depression 02/10/2020   Tinea cruris 01/28/2020   Left shoulder tendinitis 10/13/2018   Gait abnormality 09/21/2018   Poor short-term memory 09/21/2018   Drooping eyelid disease, left 09/21/2018   History of prostate cancer 09/23/2016   Essential hypertension 09/23/2016   Diabetes mellitus without complication ([place]) 24/08/7352   Brain tumor (benign) ([place]) 06/04/2011   GERD (gastroesophageal reflux disease) 06/04/2011   Dyslipidemia 06/04/2011   Headache disorder 06/04/2011   Hypothyroidism 03/18/2008   Asthma 03/18/2008    Current Outpatient Medications on File Prior to Visit  Medication Sig Dispense Refill   ADVAIR DISKUS 250-50 MCG/DOSE AEPB USE 1 PUFF EVERY 12 HOURS. (Patient  taking differently: Inhale 1 puff into the lungs every 12 (twelve) hours.) 60 each 0   albuterol (PROAIR HFA) 108 (90 Base) MCG/ACT inhaler USE @ PUFFS EVERY 6 HOURS AS NEEDED FOR WHEEZING 8.5 g 5   aspirin 81 MG tablet Take 81 mg by mouth daily.     Cetirizine HCl 10 MG CAPS Take by mouth.     ezetimibe-simvastatin (VYTORIN) 10-40 MG tablet Take 1 tablet by mouth at bedtime. 90 tablet 1   levothyroxine (SYNTHROID) 88 MCG tablet TAKE 1 TABLET ONCE DAILY IN THE MORNING BEFORE BREAKFAST. 90 tablet 1   lisinopril (ZESTRIL) 10 MG tablet Take 1 tablet (10 mg total) by mouth daily. 90 tablet 1   montelukast (SINGULAIR) 10 MG tablet Take 1 tablet (10 mg total) by mouth at bedtime. 90 tablet 1   pantoprazole (PROTONIX) 40 MG tablet Take 1 tablet (40 mg total) by mouth daily. 90 tablet 1   No current facility-administered medications on file prior to visit.    Past Medical History:  Diagnosis Date   Allergy    Anxiety    Asthma    severe, Dr. Linna Darner   Blood clots in brain    history of    Brain tumor [facility]) 1993   Depression    situational   GERD (gastroesophageal reflux disease)    severe   Headache(784.0)    Hyperlipidemia  Orchalgia    Pneumonia    hx of   Prostate cancer ([place]) 07/01/13   gleason 6   Thyroid disease    hypothyroidism    Past Surgical History:  Procedure Laterality Date   brain shunt  1993   put in, removed, put in again   LYMPHADENECTOMY Bilateral 10/22/2013   Procedure: LYMPHADENECTOMY;  Surgeon: Alexis Frock, MD;  Location: [facility];  Service: Urology;  Laterality: Bilateral;   removal brain tumor  1993   pineal tumor, benign   removed blood clot     X 2   ROBOT ASSISTED LAPAROSCOPIC RADICAL PROSTATECTOMY N/A 10/22/2013   Procedure: ROBOTIC ASSISTED LAPAROSCOPIC RADICAL PROSTATECTOMY   PRE- PERITONEAL APPROACH;  Surgeon: Alexis Frock, MD;  Location: [facility];  Service: Urology;  Laterality: N/A;  3.5 HRS     Social History   Socioeconomic History    Marital status: Married    Spouse name: Not on file   Number of children: Not on file   Years of education: Not on file   Highest education level: Not on file  Occupational History   Not on file  Tobacco Use   Smoking status: Never   Smokeless tobacco: Never  Substance and Sexual Activity   Alcohol use: Yes    Comment: Rarely   Drug use: No   Sexual activity: Not on file  Other Topics Concern   Not on file  Social History Narrative   Exercise: none   Social Determinants of Health   Financial Resource Strain: Not on file  Food Insecurity: Not on file  Transportation Needs: Not on file  Physical Activity: Not on file  Stress: Not on file  Social Connections: Not on file    Family History  Problem Relation Age of Onset   Cancer Brother        anal cancer   Asthma Maternal Uncle    Cancer Father        prostate  cancer   Testicular cancer Father    Coronary artery disease Mother 21       5 vessel CABG   Colon cancer Neg Hx    Stomach cancer Neg Hx     Review of Systems  Constitutional:  Negative for fever.  Eyes:  Positive for visual disturbance (double vision - chronic since prior brain surgery).  Respiratory:  Positive for cough (chronic - no change). Negative for shortness of breath and wheezing.   Cardiovascular:  Positive for leg swelling. Negative for chest pain and palpitations.  Neurological:  Positive for dizziness (sometimes). Negative for headaches.      Objective:   Vitals:   06/08/21 1505  BP: (!) 150/90  Pulse: 76  Temp: 97.9 F (36.6 C)  SpO2: 98%   BP Readings from Last 3 Encounters:  06/08/21 (!) 150/90  05/31/21 (!) 165/82  05/21/21 (!) 142/82   Wt Readings from Last 3 Encounters:  06/08/21 189 lb 3.2 oz (85.8 kg)  05/31/21 188 lb 4.8 oz (85.4 kg)  05/21/21 188 lb 12.8 oz (85.6 kg)   Body mass index is 28.77 kg/m.   Physical Exam    Constitutional: Appears well-developed and well-nourished. No distress.  HENT:  Head:  Normocephalic and atraumatic.  Neck: Neck supple. No tracheal deviation present. No thyromegaly present.  No cervical lymphadenopathy Cardiovascular: Normal rate, regular rhythm and normal heart sounds.   No murmur heard. No carotid bruit .  ! + b/l LE LLE > RLE edema Pulmonary/Chest: Effort normal and  breath sounds normal. No respiratory distress. No has no wheezes. No rales.  Skin: Skin is warm and dry. Not diaphoretic.  Psychiatric: Normal mood and affect. Behavior is normal.      Assessment & Plan:    See Problem List for Assessment and Plan of chronic medical problems.    This visit occurred during the SARS-CoV-2 public health emergency.  Safety protocols were in place, including screening questions prior to the visit, additional usage of staff PPE, and extensive cleaning of exam room while observing appropriate contact time as indicated for disinfecting solutions.

## 2021-06-08 ENCOUNTER — Encounter: Payer: Self-pay | Admitting: Internal Medicine

## 2021-06-08 ENCOUNTER — Other Ambulatory Visit: Payer: Self-pay

## 2021-06-08 ENCOUNTER — Ambulatory Visit: Payer: 59 | Admitting: Internal Medicine

## 2021-06-08 DIAGNOSIS — E119 Type 2 diabetes mellitus without complications: Secondary | ICD-10-CM | POA: Diagnosis not present

## 2021-06-08 DIAGNOSIS — I10 Essential (primary) hypertension: Secondary | ICD-10-CM | POA: Diagnosis not present

## 2021-06-08 DIAGNOSIS — E039 Hypothyroidism, unspecified: Secondary | ICD-10-CM | POA: Diagnosis not present

## 2021-06-08 DIAGNOSIS — G9389 Other specified disorders of brain: Secondary | ICD-10-CM

## 2021-06-08 MED ORDER — HYDROCHLOROTHIAZIDE 25 MG PO TABS: 25.0000 mg | ORAL_TABLET | Freq: Every day | ORAL | 1 refills | Status: DC

## 2021-06-08 NOTE — Patient Instructions (Addendum)
   Start wearing compression socks daily - medium compression 20-30 mm Hg pressure    Start hydrochlorothiazide 25 mg daily for your BP and leg swelling.   Continue your other medications.    Follow up in 4 months

## 2021-06-09 NOTE — Assessment & Plan Note (Signed)
Chronic BP not controlled Continue lisinopril 10 mg qd Start hctz 25 mg daily for BP and leg swelling Monitor BP

## 2021-06-09 NOTE — Assessment & Plan Note (Signed)
New Workup underway - has seen neurosurgery Following with Dr Mickeal Skinner To have MRI brain later this month - ? Grown CT C/A/P - ? Metastatic, ? Infection H/o pineocytoma - resected in 1993  ? recurrence

## 2021-06-09 NOTE — Assessment & Plan Note (Signed)
Chronic Diet controlled  Lab Results  Component Value Date   HGBA1C 5.9 05/21/2021

## 2021-06-09 NOTE — Assessment & Plan Note (Signed)
Chronic Continue levothyroxine 88 mcg dialy

## 2021-06-11 ENCOUNTER — Other Ambulatory Visit: Payer: Self-pay

## 2021-06-11 ENCOUNTER — Ambulatory Visit (HOSPITAL_COMMUNITY)
Admission: RE | Admit: 2021-06-11 | Discharge: 2021-06-11 | Disposition: A | Discharge status: Home / Self Care | Payer: 59 | Source: Ambulatory Visit | Attending: Internal Medicine | Admitting: Internal Medicine

## 2021-06-11 DIAGNOSIS — G9389 Other specified disorders of brain: Secondary | ICD-10-CM | POA: Diagnosis not present

## 2021-06-12 ENCOUNTER — Telehealth: Payer: Self-pay

## 2021-06-12 NOTE — Telephone Encounter (Signed)
Returned call to pt's wife per her request. Explained pt's appointment tomorrow for labs is at the [facility] and Dr Mickeal Skinner will be monitoring the lab results. Pt currently does not have an MRI appointment and Dr Mickeal Skinner is not planning on doing an MRI right away. Wife asked about CT scan results and surgery. Told pt's wife I will discuss with Dr Mickeal Skinner and would update her. Pt and wife verbalized understanding.

## 2021-06-13 ENCOUNTER — Inpatient Hospital Stay: Payer: 59

## 2021-06-13 ENCOUNTER — Other Ambulatory Visit: Payer: Self-pay

## 2021-06-13 DIAGNOSIS — G9389 Other specified disorders of brain: Secondary | ICD-10-CM

## 2021-06-13 DIAGNOSIS — Z862 Personal history of diseases of the blood and blood-forming organs and certain disorders involving the immune mechanism: Secondary | ICD-10-CM

## 2021-06-13 LAB — FIBRINOGEN: Fibrinogen: 421 mg/dL (ref 210–475)

## 2021-06-13 LAB — APTT: aPTT: 32 seconds (ref 24–36)

## 2021-06-14 ENCOUNTER — Encounter: Payer: Self-pay | Admitting: Internal Medicine

## 2021-06-14 LAB — COAG STUDIES INTERP REPORT

## 2021-06-14 LAB — PLATELET FUNCTION ASSAY
Collagen / ADP: 148 seconds — ABNORMAL HIGH (ref 0–118)
Collagen / Epinephrine: >300 seconds — ABNORMAL HIGH (ref 0–193)

## 2021-06-14 LAB — VON WILLEBRAND PANEL
Coagulation Factor VIII: 97 % (ref 56–140)
Ristocetin Co-factor, Plasma: 71 % (ref 50–200)
Von Willebrand Antigen, Plasma: 90 % (ref 50–200)

## 2021-06-14 LAB — THROMBIN TIME: Thrombin Time: 16.8 s (ref 0.0–23.0)

## 2021-06-15 ENCOUNTER — Other Ambulatory Visit: Payer: Self-pay | Admitting: Radiation Therapy

## 2021-06-21 ENCOUNTER — Other Ambulatory Visit: Payer: Self-pay | Admitting: Neurosurgery

## 2021-06-21 ENCOUNTER — Telehealth: Payer: Self-pay | Admitting: Internal Medicine

## 2021-06-21 NOTE — Telephone Encounter (Signed)
I have spoken with pts wife regarding her messages. She is aware of the pts date for bx and how to contact Dr. Marcello Moores' office. She expressed understanding of this information.

## 2021-06-21 NOTE — Telephone Encounter (Signed)
   Patients wife called and said that the patient received a message but was unsure what it was for and when it was left. Please advise   Phone: [phone]

## 2021-06-22 ENCOUNTER — Other Ambulatory Visit: Payer: Self-pay | Admitting: Neurosurgery

## 2021-06-22 ENCOUNTER — Encounter: Payer: Self-pay | Admitting: Internal Medicine

## 2021-06-25 NOTE — Progress Notes (Signed)
Surgical Instructions    Your procedure is scheduled on Friday 30th.  Report to [facility] Main Entrance "A" at 11 A.M., then check in with the Admitting office.  Call this number if you have problems the morning of surgery:  [phone]   If you have any questions prior to your surgery date call [phone]: Open Monday-Friday 8am-4pm    Remember:  Do not eat or drink anything after midnight the night before your surgery   Take these medicines the morning of surgery with A SIP OF WATER  ADVAIR DISKUS 250-50 MCG/DOSE AEPB cetirizine (ZYRTEC) 10 MG tablet levothyroxine (SYNTHROID) 88 MCG tablet pantoprazole (PROTONIX) 40 MG tablet  IF NEEDED albuterol (PROAIR HFA) 108 (90 Base) MCG/ACT inhaler please bring inhaler with you to the hospital  As of today, STOP taking any Aspirin (unless otherwise instructed by your surgeon) Aleve, Naproxen, Ibuprofen, Motrin, Advil, Goody's, BC's, all herbal medications, fish oil, and all vitamins.          Do not wear jewelry  Do not wear lotions, powders, perfumes/colognes, or deodorant. Do not shave 48 hours prior to surgery.  Men may shave face and neck. Do not bring valuables to the hospital. DO Not wear nail polish, gel polish, artificial nails, or any other type of covering on  natural nails including finger and toenails. If patients have artificial nails, gel coating, etc. that need to be removed by a nail salon please have this removed prior to surgery or surgery may need to be canceled/delayed if the surgeon/ anesthesia feels like the patient is unable to be adequately monitored.             [facility] is not responsible for any belongings or valuables.  Do NOT Smoke (Tobacco/Vaping) or drink Alcohol 24 hours prior to your procedure If you use a CPAP at night, you may bring all equipment for your overnight stay.   Contacts, glasses, dentures or bridgework may not be worn into surgery, please bring cases for these belongings   For  patients admitted to the hospital, discharge time will be determined by your treatment team.   Patients discharged the day of surgery will not be allowed to drive home, and someone needs to stay with them for 24 hours.  ONLY 1 SUPPORT PERSON MAY BE PRESENT WHILE YOU ARE IN SURGERY. IF YOU ARE TO BE ADMITTED ONCE YOU ARE IN YOUR ROOM YOU WILL BE ALLOWED TWO (2) VISITORS.  Minor children may have two parents present. Special consideration for safety and communication needs will be reviewed on a case by case basis.  Special instructions:    Oral Hygiene is also important to reduce your risk of infection.  Remember - BRUSH YOUR TEETH THE MORNING OF SURGERY WITH YOUR REGULAR TOOTHPASTE   [facility]- Preparing For Surgery  Before surgery, you can play an important role. Because skin is not sterile, your skin needs to be as free of germs as possible. You can reduce the number of germs on your skin by washing with CHG (chlorahexidine gluconate) Soap before surgery.  CHG is an antiseptic cleaner which kills germs and bonds with the skin to continue killing germs even after washing.     Please do not use if you have an allergy to CHG or antibacterial soaps. If your skin becomes reddened/irritated stop using the CHG.  Do not shave (including legs and underarms) for at least 48 hours prior to first CHG shower. It is OK to shave your face.  Please follow these instructions carefully.     Shower the NIGHT BEFORE SURGERY and the MORNING OF SURGERY with CHG Soap.   If you chose to wash your hair, wash your hair first as usual with your normal shampoo. After you shampoo, rinse your hair and body thoroughly to remove the shampoo.  Then [employer] and genitals (private parts) with your normal soap and rinse thoroughly to remove soap.  After that Use CHG Soap as you would any other liquid soap. You can apply CHG directly to the skin and wash gently with a scrungie or a clean washcloth.   Apply the CHG Soap to  your body ONLY FROM THE NECK DOWN.  Do not use on open wounds or open sores. Avoid contact with your eyes, ears, mouth and genitals (private parts). Wash Face and genitals (private parts)  with your normal soap.   Wash thoroughly, paying special attention to the area where your surgery will be performed.  Thoroughly rinse your body with warm water from the neck down.  DO NOT shower/wash with your normal soap after using and rinsing off the CHG Soap.  Pat yourself dry with a CLEAN TOWEL.  Wear CLEAN PAJAMAS to bed the night before surgery  Place CLEAN SHEETS on your bed the night before your surgery  DO NOT SLEEP WITH PETS.   Day of Surgery:  Take a shower with CHG soap. Wear Clean/Comfortable clothing the morning of surgery Do not apply any deodorants/lotions.   Remember to brush your teeth WITH YOUR REGULAR TOOTHPASTE.   Please read over the following fact sheets that you were given.

## 2021-06-26 ENCOUNTER — Encounter (HOSPITAL_COMMUNITY)
Admission: RE | Admit: 2021-06-26 | Discharge: 2021-06-26 | Disposition: A | Discharge status: Home / Self Care | Payer: 59 | Source: Ambulatory Visit | Attending: Neurosurgery | Admitting: Neurosurgery

## 2021-06-26 ENCOUNTER — Other Ambulatory Visit: Payer: Self-pay

## 2021-06-26 ENCOUNTER — Encounter (HOSPITAL_COMMUNITY): Payer: Self-pay

## 2021-06-26 DIAGNOSIS — Z20822 Contact with and (suspected) exposure to covid-19: Secondary | ICD-10-CM | POA: Insufficient documentation

## 2021-06-26 DIAGNOSIS — Z01818 Encounter for other preprocedural examination: Secondary | ICD-10-CM | POA: Insufficient documentation

## 2021-06-26 HISTORY — DX: Dyspnea, unspecified: R06.00

## 2021-06-26 HISTORY — DX: Unspecified osteoarthritis, unspecified site: M19.90

## 2021-06-26 HISTORY — DX: Hypothyroidism, unspecified: E03.9

## 2021-06-26 HISTORY — DX: Prediabetes: R73.03

## 2021-06-26 LAB — BASIC METABOLIC PANEL
Anion gap: 10 (ref 5–15)
BUN: 18 mg/dL (ref 8–23)
CO2: 28 mmol/L (ref 22–32)
Calcium: 9 mg/dL (ref 8.9–10.3)
Chloride: 97 mmol/L — ABNORMAL LOW (ref 98–111)
Creatinine, Ser: 0.78 mg/dL (ref 0.61–1.24)
GFR, Estimated: >60 mL/min (ref >60)
Glucose, Bld: 90 mg/dL (ref 70–99)
Potassium: 3.3 mmol/L — ABNORMAL LOW (ref 3.5–5.1)
Sodium: 135 mmol/L (ref 135–145)

## 2021-06-26 LAB — CBC
HCT: 36.7 % — ABNORMAL LOW (ref 39.0–52.0)
Hemoglobin: 12.2 g/dL — ABNORMAL LOW (ref 13.0–17.0)
MCH: 29.3 pg (ref 26.0–34.0)
MCHC: 33.2 g/dL (ref 30.0–36.0)
MCV: 88 fL (ref 80.0–100.0)
Platelets: 270 10*3/uL (ref 150–400)
RBC: 4.17 MIL/uL — ABNORMAL LOW (ref 4.22–5.81)
RDW: 14.1 % (ref 11.5–15.5)
WBC: 6.8 10*3/uL (ref 4.0–10.5)
nRBC: 0 % (ref 0.0–0.2)

## 2021-06-26 LAB — SARS CORONAVIRUS 2 (TAT 6-24 HRS): SARS Coronavirus 2: NEGATIVE

## 2021-06-26 LAB — GLUCOSE, CAPILLARY: Glucose-Capillary: 99 mg/dL (ref 70–99)

## 2021-06-26 NOTE — Progress Notes (Signed)
PCP - Dr. Billey Gosling Cardiologist - Denies  Chest x-ray - Not indicated EKG - Not indicated Stress Test - Denies ECHO - Denies Cardiac Cath - Denies  Sleep Study - Denies  DM - Borderline CBG at PAT appt 99 Dr. Quay Burow checks on visits with PCP otherwise does not check  Aspirin Instructions: Last dose of Aspirin the 24th  COVID TEST- 06/26/21  Anesthesia review: No   Patient denies shortness of breath, fever, cough and chest pain at PAT appointment   All instructions explained to the patient, with a verbal understanding of the material. Patient agrees to go over the instructions while at home for a better understanding. Patient also instructed to wear a mask in public after being tested for COVID-19. The opportunity to ask questions was provided.

## 2021-06-26 NOTE — Progress Notes (Signed)
Correct arrival day to Thursday the 30th on patient's instructions.

## 2021-06-27 ENCOUNTER — Other Ambulatory Visit: Payer: Self-pay | Admitting: Radiation Therapy

## 2021-06-27 LAB — HEMOGLOBIN A1C
Hgb A1c MFr Bld: 5.8 % — ABNORMAL HIGH (ref 4.8–5.6)
Mean Plasma Glucose: 120 mg/dL

## 2021-06-27 NOTE — Anesthesia Preprocedure Evaluation (Addendum)
Anesthesia Evaluation  Patient identified by MRN, date of birth, ID band Patient awake    Reviewed: Allergy & Precautions, NPO status , Patient's Chart, lab work & pertinent test results  Airway Mallampati: II  TM Distance: >3 FB Neck ROM: Full    Dental  (+) Dental Advisory Given   Pulmonary asthma ,    breath sounds clear to auscultation       Cardiovascular hypertension,  Rhythm:Regular Rate:Normal     Neuro/Psych  Headaches, Brain lesion    GI/Hepatic Neg liver ROS, GERD  ,  Endo/Other  diabetes, Type 2Hypothyroidism   Renal/GU negative Renal ROS     Musculoskeletal  (+) Arthritis ,   Abdominal   Peds  Hematology negative hematology ROS (+)   Anesthesia Other Findings   Reproductive/Obstetrics                             Anesthesia Physical Anesthesia Plan  ASA: 3  Anesthesia Plan: General   Post-op Pain Management:    Induction: Intravenous  PONV Risk Score and Plan: 2 and Dexamethasone, Ondansetron and Treatment may vary due to age or medical condition  Airway Management Planned: Oral ETT  Additional Equipment: Arterial line  Intra-op Plan:   Post-operative Plan: Extubation in OR  Informed Consent: I have reviewed the patients History and Physical, chart, labs and discussed the procedure including the risks, benefits and alternatives for the proposed anesthesia with the patient or authorized representative who has indicated his/her understanding and acceptance.     Dental advisory given  Plan Discussed with: CRNA  Anesthesia Plan Comments: ( )       Anesthesia Quick Evaluation

## 2021-06-27 NOTE — Progress Notes (Signed)
Anesthesia Chart Review:  Case: [id number] Date/Time: 06/28/21 1241   Procedures:      STEREOTACTIC FRONTAL BIOPSY OF CAUDATE LESION (Right)     APPLICATION OF CRANIAL NAVIGATION (Right)   Anesthesia type: General   Pre-op diagnosis: BRAIN LESION   Location: [facility] OR ROOM 18 / [facility] OR   Surgeons: Vallarie Mare, Adam Sullivan       DISCUSSION: Patient is a 65 year old male scheduled for the above procedure.  History includes never smoker, hypothyroidism, pre-diabetes, dyspnea, asthma, GERD, HLD, prostate cancer (s/p robot assisted laparoscopic radical prostatectomy 10/22/13), brain tumor (12/31/91, s/p "pineocytoma" resection with post-operative hemorrhage, s/p surgery/VP shunt placement by Dr. Kathlen Brunswick in [place] followed by radiation in [place])  developed post-operative hemorrhage; s/p radiation; "required another surgery for additional hematoma evacuation and shunt removal. 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     Given his recurrent bleeding episodes associated with his brain surgery in the 1990's, there was question if he may have a bleeding disorder. Dr. Marcello Moores talked with Dr. Mickeal Skinner who planned for "Curbside heme for baseline hematologic workup given history of frequent CNS hemorrhage." On 06/13/21, Adam Sullivan had coagulation studies showing: - Normal aPTT 32 seconds, Thrombin Time 16.8 seconds, Fibrinogen 421. - Abnormal Platelet function assay with Collagen/ADP 148 seconds (normal 0-118 seconds). "Platelet function is abnormal. This pattern is most commonly seen in patients with von Willebrand disease or congenital platelet defects.  Prolongation  of the Col/ADP closure time generally indicate a more extensive qualitative platelet defect and/or abnormality in von Willebrand factor.  Results of the test should always be interpreted in conjunction with the patient's medical history, clinical presentation and medication history. Patients with  Hematocrit values <35.0% or Platelet counts <150,000/uL may result in values above the Laboratory established reference range." Abnormal Collagen/Epinephrine > 300 seconds (0-193 seconds). - VWF:Ag is normal at 90%. VWF: RCo is normal at 71%. FVIII is normal at 97%. These results are not consistent with a diagnosis of VWD according to the current NHLBI guideline.  Appears von Willebrand disease was ruled out, but results indicated that abnormal platelet function can also been seen in congenital platelet defects.  There is documentation from Dr. Mickeal Skinner stating, "I am discussing blood test results with dr Marcello Moores and I have asked dr Beatrice Lecher and provide guidance.  So unsure yet on biopsy". Biopsy subsequently scheduled for 06/28/21. Urgent staff message sent to Dr. Mickeal Skinner to clarify hematologist's input. He replied that "Per my discussion with Dr Lorenso Courier, his laboratory abnormalities are explainable by dosing of aspirin.  Dr Marcello Moores had requested oral ASA to be held for one weekprior to intervention."  Preoperative COVID-19 test negative on 06/26/2021.  Anesthesia team to evaluate on the day of surgery.   VS: BP (!) 152/82   Pulse 90   Temp 36.7 C (Oral)   Resp 18   Ht 5\' 6"  (1.676 m)   Wt 83.9 kg   SpO2 97%   BMI 29.86 kg/m    PROVIDERS: Adam Rail, Adam Sullivan is PCP Adam Cobbs, Adam Sullivan is Neuro-ONC   LABS: Labs reviewed: Acceptable for surgery. TSH 9.44 on 05/21/21, levothyroxine resumed. (all labs ordered are listed, but only abnormal results are displayed)  Labs Reviewed  HEMOGLOBIN A1C - Abnormal; Notable for the following components:      Result Value   Hgb A1c MFr Bld 5.8 (*)    All other components within normal limits  BASIC METABOLIC PANEL - Abnormal; Notable for  the following components:   Potassium 3.3 (*)    Chloride 97 (*)    All other components within normal limits  CBC - Abnormal; Notable for the following components:   RBC 4.17 (*)    Hemoglobin 12.2 (*)    HCT  36.7 (*)    All other components within normal limits  SARS CORONAVIRUS 2 (TAT 6-24 HRS)  GLUCOSE, CAPILLARY     IMAGES: CT Chest/abd/pelvis 06/11/21: IMPRESSION: 1. No findings for primary neoplasm or metastatic disease. 2. Age advanced coronary artery calcifications. 3. Status post prostatectomy. 4. Borderline inguinal lymph nodes are likely reactive. 5. VP shunt catheter as detailed above. 6. Aortic atherosclerosis.   MRI Brain 05/24/21: IMPRESSION: 1. 2 cm enhancing mass in the right caudate region most concerning for neoplasm (glioma, lymphoma, or metastasis) with infection considered less likely. 2. Smaller enhancing lesions in the right internal capsule and pons also concerning for additional sites of neoplasm or infection, less likely subacute infarcts. 3. Chronic ischemia with multiple old infarcts and chronic hemorrhages as above.    EKG: 06/26/21:  Normal sinus rhythm Left ventricular hypertrophy with QRS widening ( R in aVL , Cornell product ) Abnormal ECG Confirmed by Eleonore Chiquito [phone]) on 06/26/2021 8:40:11 PM - QRS 120 ms, previously 110 ms on 04/06/19 tracing.   CV: LLE Venous US 05/30/21: Summary:  LEFT:  - There is no evidence of deep vein thrombosis in the lower extremity.  - There is no evidence of superficial venous thrombosis.      Past Medical History:  Diagnosis Date   Allergy    Anxiety    Arthritis    Asthma    severe, Dr. Linna Darner   Blood clots in brain    history of    Brain tumor ([place]) 12/31/1991   Depression    situational   Dyspnea    GERD (gastroesophageal reflux disease)    severe   Headache(784.0)    Hyperlipidemia    Hypothyroidism    Orchalgia    Pneumonia    hx of   Pre-diabetes    Prostate cancer ([place]) 07/01/2013   gleason 6   Thyroid disease    hypothyroidism    Past Surgical History:  Procedure Laterality Date   brain shunt  12/31/1991   put in, removed, put in again   [place] Bilateral 10/22/2013   Procedure: LYMPHADENECTOMY;  Surgeon: Alexis Frock, Adam Sullivan;  Location: [facility];  Service: Urology;  Laterality: Bilateral;   removal brain tumor  12/31/1991   pineal tumor, benign   removed blood clot     X 2   ROBOT ASSISTED LAPAROSCOPIC RADICAL PROSTATECTOMY N/A 10/22/2013   Procedure: ROBOTIC ASSISTED LAPAROSCOPIC RADICAL PROSTATECTOMY   PRE- PERITONEAL APPROACH;  Surgeon: Alexis Frock, Adam Sullivan;  Location: [facility];  Service: Urology;  Laterality: N/A;  3.5 HRS     MEDICATIONS:  ADVAIR DISKUS 250-50 MCG/DOSE AEPB   albuterol (PROAIR HFA) 108 (90 Base) MCG/ACT inhaler   aspirin 81 MG tablet   cetirizine (ZYRTEC) 10 MG tablet   ezetimibe-simvastatin (VYTORIN) 10-40 MG tablet   hydrochlorothiazide (HYDRODIURIL) 25 MG tablet   levothyroxine (SYNTHROID) 88 MCG tablet   lisinopril (ZESTRIL) 10 MG tablet   montelukast (SINGULAIR) 10 MG tablet   Multiple Vitamins-Minerals (CENTRUM SILVER 50+MEN PO)   pantoprazole (PROTONIX) 40 MG tablet   vitamin B-12 (CYANOCOBALAMIN) 500 MCG tablet   No current facility-administered medications for this encounter.  Myra Gianotti, PA-C Surgical Short Stay/Anesthesiology [facility] Phone [phone] [facility] Phone [phone] 06/27/2021 3:03 PM

## 2021-06-27 NOTE — H&P (Addendum)
CC: brain lesion  HPI:    This is a 65 year old man who presents for evaluation of multiple brain lesions.  In 1993, he underwent resection of a pineal cytoma by Dr. Kathlen Brunswick at [place].  Unfortunately, postoperatively, he suffered a large hemorrhage which required emergent surgery and he had a difficult recovery.  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  He had a intraoperative shunt placed by Dr. Delice Lesch and followed up with Dr. Hal Neer.  He underwent postoperative radiation in [place], but the wife does not know the details of the radiation.  He underwent placement of a right frontal shunt, but developed a hemorrhage postoperatively that required emergency surgery.  He then required another surgery for additional hematoma evacuation and shunt removal.  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  During these postoperative bleeding episodes, he was evaluated but a hematologist and was diagnosed with a bleeding disorder, though his wife does not recall what the the name of it is.  He has had chronic difficulties neurologically, but the wife is noted more recently that he has had worsening shuffling gait, sleepiness, and memory difficulties.  She notes that he has little energy and usually sits in front of the TV and falls asleep on the couch.  He had neurologic workup which included MRI which showed enhancing lesions in his right caudate, right basal ganglia, and right pons.   Patient Active Problem List   Diagnosis Date Noted   Brain mass 05/31/2021   History of CVA (cerebrovascular accident) 03/08/2020   Fatigue 02/10/2020   Memory loss 02/10/2020   Depression 02/10/2020   Tinea cruris 01/28/2020   Left shoulder tendinitis 10/13/2018   Gait abnormality 09/21/2018   Poor short-term memory 09/21/2018   Drooping eyelid disease, left 09/21/2018   History of prostate cancer 09/23/2016   Essential  hypertension 09/23/2016   Diabetes mellitus without complication ([place]) 41/74/0814   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/2011   GERD (gastroesophageal reflux disease) 06/04/2011   Dyslipidemia 06/04/2011   Headache disorder 06/04/2011   Hypothyroidism 03/18/2008   Asthma 03/18/2008   Past Medical History:  Diagnosis Date   Allergy    Anxiety    Arthritis    Asthma    severe, Dr. Linna Darner   Blood clots in brain    history of    Brain tumor ([place]) 12/31/1991   Depression    situational   Dyspnea    GERD (gastroesophageal reflux disease)    severe   Headache(784.0)    Hyperlipidemia    Hypothyroidism    Orchalgia    Pneumonia    hx of   Pre-diabetes    Prostate cancer ([place]) 07/01/2013   gleason 6   Thyroid disease    hypothyroidism    Past Surgical History:  Procedure Laterality Date   brain shunt  12/31/1991   put in, removed, put in again   [place] Bilateral 10/22/2013   Procedure: LYMPHADENECTOMY;  Surgeon: Alexis Frock, MD;  Location: [facility];  Service: Urology;  Laterality: Bilateral;   removal brain tumor  12/31/1991   pineal tumor, benign   removed blood clot     X 2   ROBOT ASSISTED LAPAROSCOPIC RADICAL PROSTATECTOMY N/A 10/22/2013   Procedure: ROBOTIC ASSISTED LAPAROSCOPIC RADICAL PROSTATECTOMY   PRE- PERITONEAL APPROACH;  Surgeon: Alexis Frock, MD;  Location: [facility];  Service: Urology;  Laterality: N/A;  3.5 HRS     No medications prior to admission.   Allergies  Allergen Reactions   Chocolate     Unsure of reaction   Dust Mite Extract     Asthma   Erythromycin Other (See Comments)    Pt unsure of what the reaction was. Happen as a child.    Kola Nut     Can not drink pepsi or coca cola, unsure of reaction    Social History   Tobacco Use   Smoking status: Never   Smokeless tobacco: Never  Substance Use Topics   Alcohol use: Yes    Comment: Rarely    Family History   Problem Relation Age of Onset   Cancer Brother        anal cancer   Asthma Maternal Uncle    Cancer Father        prostate  cancer   Testicular cancer Father    Coronary artery disease Mother 8       5 vessel CABG   Colon cancer Neg Hx    Stomach cancer Neg Hx      Review of Systems Pertinent items noted in HPI and remainder of comprehensive ROS otherwise negative.  Objective:   No data found. No intake/output data recorded. No intake/output data recorded.   PHYSICAL EXAM Details General Level of Distress: no acute distress Overall Appearance: Normal  Head and Face  Right Left  Fundoscopic Exam:  unable to visualize unable to visualize    Cardiovascular  Right Left  Peripheral Pulses: normal normal  Carotid Pulses:  normal  Respiratory Lungs: non-labored  Neurological Orientation: normal Recent and Remote Memory: impaired remote memory, impaired recent memory Attention Span and Concentration:   impaired attention span Language: normal Fund of Knowledge: impaired awareness  Right Left Sensation: normal normal Upper Extremity Coordination: normal normal  Lower Extremity Coordination: normal normal  Musculoskeletal Gait and Station: normal  Right Left Upper Extremity Muscle Tone:  normal  Lower Extremity Muscle Tone: normal    Motor Strength . Any abnormal findings will be noted below.   Right Left Deltoid: 5/5 5/5 Biceps: 5/5 5/5 Triceps: 5/5 5/5 Wrist Extensor: 5/5 5/5 Grip: 5/5 5/5 Finger Extensor: 5/5 5/5 Hip Flexor: 5/5 5/5 Knee Extensor: 5/5 5/5 Knee Flexor: 5/5 5/5 Tib Anterior: 5/5 5/5 EHL: 5/5 5/5 Medial Gastroc: 5/5 5/5  Gaze Normal Horizontal Gaze Stability: abnormal Vertical Gaze Stability:  abnormal  Sensory Sensation was tested at C2 to T1 and L1 to S1.   Cranial Nerves II. Optic Nerve/Visual Fields: normal III. Oculomotor: EOMI IV. Trochlear: EOMI V. Trigeminal: sensory intact VI. Abducens: EOMI VII. Facial: no  facial droop VIII. Acoustic/Vestibular: hearing intact IX. Glossopharyngeal: palate elevation symmetric X. Vagus: no hoarseness XI. Spinal Accessory: shoulder shrug full XII. Hypoglossal: tongue protrusion midline   Additional Findings:  Right frontal incision well-healed.  Small skull defect over right frontal area.  Shunt palpated over right parietal area.  Skull defect in posterior fossa.  Pleasant, confused.  Oriented to person only.   Data ReviewCBC:  Lab Results  Component Value Date   WBC 6.8 06/26/2021   RBC 4.17 (L) 06/26/2021   BMP:  Lab Results  Component Value Date   GLUCOSE 90 06/26/2021   CO2 28 06/26/2021   BUN 18 06/26/2021   CREATININE 0.78 06/26/2021   CALCIUM 9.0 06/26/2021   Radiology review:  MRI brain with and without contrast from May 24, 2021 was reviewed.  There is  an enhancing lesion in the right caudate head.  There is also a smaller lesion in the right globus pallidus and internal capsule posterior limb.  There is a small enhancing lesion in the right pons.  Previous catheter tract is seen over the right frontal area and lobe.  There is a right parietal shunt entering the atrium and associated shunt valve.  There is a midline 3rd ventricular low subarachnoid shunt noted and posterior fossa skull defect.  These lesions were not apparent on previous CT head from February, 2022.   Assessment:   Active Problems:   * No active hospital problems. * This is a 65 year old man with history of pineal cytoma status post resection and radiation and multiple VP shunt procedures was new enhancing lesions in the caudate and right basal ganglia as well as the right pons concerning for a malignant process.  Plan:  - after interdisciplinary discussion in neuro-oncology tumor board, stereotactic biopsy was recommended. -  I discussed in detail the procedure with the wife and patient.   Risks, benefits, alternatives, and expected convalescence was discussed.  Risks  discussed included but were not limited to bleeding, pain, infection, seizure, stroke, scar, cerebrospinal fluid leak, non-diagnostic tissue, shunt malfunction, neurologic deficit, paralysis, coma and death.  I also discussed with them the possibility of blood transfusion during surgery and consent to transfuse blood products if necessary was also obtained.  All questions and concerns were answered and understanding and agreement with the plan was verbalized.

## 2021-06-28 ENCOUNTER — Inpatient Hospital Stay (HOSPITAL_COMMUNITY)
Admission: RE | Admit: 2021-06-28 | Discharge: 2021-06-29 | DRG: 026 | Disposition: A | Discharge status: Home / Self Care | Payer: 59 | Attending: Neurosurgery | Admitting: Neurosurgery

## 2021-06-28 ENCOUNTER — Inpatient Hospital Stay (HOSPITAL_COMMUNITY): Payer: 59

## 2021-06-28 ENCOUNTER — Inpatient Hospital Stay (HOSPITAL_COMMUNITY): Payer: 59 | Admitting: Vascular Surgery

## 2021-06-28 ENCOUNTER — Encounter (HOSPITAL_COMMUNITY): Payer: Self-pay

## 2021-06-28 ENCOUNTER — Other Ambulatory Visit: Payer: Self-pay

## 2021-06-28 ENCOUNTER — Encounter (HOSPITAL_COMMUNITY)
Admission: RE | Disposition: A | Discharge status: Home / Self Care | Payer: Self-pay | Source: Home / Self Care | Attending: Neurosurgery

## 2021-06-28 DIAGNOSIS — Z8546 Personal history of malignant neoplasm of prostate: Secondary | ICD-10-CM

## 2021-06-28 DIAGNOSIS — Z20822 Contact with and (suspected) exposure to covid-19: Secondary | ICD-10-CM | POA: Diagnosis present

## 2021-06-28 DIAGNOSIS — J45909 Unspecified asthma, uncomplicated: Secondary | ICD-10-CM | POA: Diagnosis present

## 2021-06-28 DIAGNOSIS — Z8673 Personal history of transient ischemic attack (TIA), and cerebral infarction without residual deficits: Secondary | ICD-10-CM

## 2021-06-28 DIAGNOSIS — E039 Hypothyroidism, unspecified: Secondary | ICD-10-CM | POA: Diagnosis present

## 2021-06-28 DIAGNOSIS — Z91018 Allergy to other foods: Secondary | ICD-10-CM

## 2021-06-28 DIAGNOSIS — I10 Essential (primary) hypertension: Secondary | ICD-10-CM | POA: Diagnosis present

## 2021-06-28 DIAGNOSIS — Z91048 Other nonmedicinal substance allergy status: Secondary | ICD-10-CM | POA: Diagnosis not present

## 2021-06-28 DIAGNOSIS — K219 Gastro-esophageal reflux disease without esophagitis: Secondary | ICD-10-CM | POA: Diagnosis present

## 2021-06-28 DIAGNOSIS — E785 Hyperlipidemia, unspecified: Secondary | ICD-10-CM | POA: Diagnosis present

## 2021-06-28 DIAGNOSIS — Z9221 Personal history of antineoplastic chemotherapy: Secondary | ICD-10-CM

## 2021-06-28 DIAGNOSIS — Z8042 Family history of malignant neoplasm of prostate: Secondary | ICD-10-CM | POA: Diagnosis not present

## 2021-06-28 DIAGNOSIS — R4189 Other symptoms and signs involving cognitive functions and awareness: Secondary | ICD-10-CM | POA: Diagnosis present

## 2021-06-28 DIAGNOSIS — F32A Depression, unspecified: Secondary | ICD-10-CM | POA: Diagnosis present

## 2021-06-28 DIAGNOSIS — G939 Disorder of brain, unspecified: Secondary | ICD-10-CM | POA: Diagnosis present

## 2021-06-28 DIAGNOSIS — D496 Neoplasm of unspecified behavior of brain: Principal | ICD-10-CM | POA: Diagnosis present

## 2021-06-28 DIAGNOSIS — Z8249 Family history of ischemic heart disease and other diseases of the circulatory system: Secondary | ICD-10-CM

## 2021-06-28 DIAGNOSIS — Z881 Allergy status to other antibiotic agents status: Secondary | ICD-10-CM

## 2021-06-28 DIAGNOSIS — Z982 Presence of cerebrospinal fluid drainage device: Secondary | ICD-10-CM

## 2021-06-28 DIAGNOSIS — D689 Coagulation defect, unspecified: Secondary | ICD-10-CM | POA: Diagnosis present

## 2021-06-28 DIAGNOSIS — E119 Type 2 diabetes mellitus without complications: Secondary | ICD-10-CM | POA: Diagnosis present

## 2021-06-28 HISTORY — PX: APPLICATION OF CRANIAL NAVIGATION: SHX6578

## 2021-06-28 HISTORY — PX: FRAMELESS  BIOPSY WITH BRAINLAB: SHX6879

## 2021-06-28 LAB — MRSA NEXT GEN BY PCR, NASAL: MRSA by PCR Next Gen: NOT DETECTED

## 2021-06-28 LAB — GLUCOSE, CAPILLARY: Glucose-Capillary: 117 mg/dL — ABNORMAL HIGH (ref 70–99)

## 2021-06-28 SURGERY — FRAMELESS BIOPSY WITH BRAINLAB
Anesthesia: General | Laterality: Right

## 2021-06-28 MED ORDER — ARTIFICIAL TEARS OPHTHALMIC OINT
TOPICAL_OINTMENT | OPHTHALMIC | Status: AC
  Filled 2021-06-28: qty 3.5

## 2021-06-28 MED ORDER — CLEVIDIPINE BUTYRATE 0.5 MG/ML IV EMUL
0.0000 mg/h | INTRAVENOUS | Status: DC
  Administered 2021-06-28: 4 mg/h via INTRAVENOUS

## 2021-06-28 MED ORDER — CHLORHEXIDINE GLUCONATE CLOTH 2 % EX PADS: 6.0000 | MEDICATED_PAD | Freq: Every day | CUTANEOUS | Status: DC

## 2021-06-28 MED ORDER — PROMETHAZINE HCL 25 MG PO TABS: 12.5000 mg | ORAL_TABLET | ORAL | Status: DC | PRN

## 2021-06-28 MED ORDER — NICARDIPINE HCL IN NACL 20-0.86 MG/200ML-% IV SOLN: 3.0000 mg/h | INTRAVENOUS | Status: DC

## 2021-06-28 MED ORDER — POTASSIUM CHLORIDE CRYS ER 20 MEQ PO TBCR
60.0000 meq | EXTENDED_RELEASE_TABLET | Freq: Once | ORAL | Status: AC
  Administered 2021-06-28: 60 meq via ORAL
  Filled 2021-06-28: qty 3

## 2021-06-28 MED ORDER — CENTRUM SILVER 50+MEN PO TABS: ORAL_TABLET | Freq: Every day | ORAL | Status: DC

## 2021-06-28 MED ORDER — ENALAPRILAT 1.25 MG/ML IV SOLN: 1.2500 mg | Freq: Four times a day (QID) | INTRAVENOUS | Status: DC | PRN

## 2021-06-28 MED ORDER — EPHEDRINE 5 MG/ML INJ
INTRAVENOUS | Status: AC
  Filled 2021-06-28: qty 10

## 2021-06-28 MED ORDER — SODIUM CHLORIDE 0.9 % IV SOLN: INTRAVENOUS | Status: DC

## 2021-06-28 MED ORDER — CLEVIDIPINE BUTYRATE 0.5 MG/ML IV EMUL
1.0000 mg/h | INTRAVENOUS | Status: DC
  Administered 2021-06-28: 1 mg/h via INTRAVENOUS
  Filled 2021-06-28: qty 50

## 2021-06-28 MED ORDER — SIMVASTATIN 20 MG PO TABS: 40.0000 mg | ORAL_TABLET | Freq: Every day | ORAL | Status: DC

## 2021-06-28 MED ORDER — PHENYLEPHRINE 40 MCG/ML (10ML) SYRINGE FOR IV PUSH (FOR BLOOD PRESSURE SUPPORT)
PREFILLED_SYRINGE | INTRAVENOUS | Status: AC
  Filled 2021-06-28: qty 20

## 2021-06-28 MED ORDER — ACETAMINOPHEN 500 MG PO TABS: 1000.0000 mg | ORAL_TABLET | Freq: Once | ORAL | Status: DC

## 2021-06-28 MED ORDER — MIDAZOLAM HCL 2 MG/2ML IJ SOLN
INTRAMUSCULAR | Status: AC
  Filled 2021-06-28: qty 2

## 2021-06-28 MED ORDER — CEFAZOLIN SODIUM-DEXTROSE 1-4 GM/50ML-% IV SOLN
1.0000 g | Freq: Three times a day (TID) | INTRAVENOUS | Status: DC
Start: 2021-06-28 — End: 2021-06-29
  Administered 2021-06-28 – 2021-06-29 (×2): 1 g via INTRAVENOUS
  Filled 2021-06-28 (×2): qty 50

## 2021-06-28 MED ORDER — LABETALOL HCL 5 MG/ML IV SOLN
10.0000 mg | INTRAVENOUS | Status: DC | PRN
  Filled 2021-06-28: qty 4

## 2021-06-28 MED ORDER — PHENYLEPHRINE HCL-NACL 10-0.9 MG/250ML-% IV SOLN
INTRAVENOUS | Status: DC | PRN
  Administered 2021-06-28: 80 ug/min via INTRAVENOUS

## 2021-06-28 MED ORDER — PANTOPRAZOLE SODIUM 40 MG PO TBEC
40.0000 mg | DELAYED_RELEASE_TABLET | Freq: Every day | ORAL | Status: DC
  Administered 2021-06-29: 40 mg via ORAL
  Filled 2021-06-28: qty 1

## 2021-06-28 MED ORDER — ESMOLOL HCL 100 MG/10ML IV SOLN
INTRAVENOUS | Status: DC | PRN
  Administered 2021-06-28: 30 mg via INTRAVENOUS
  Administered 2021-06-28: 20 mg via INTRAVENOUS
  Administered 2021-06-28: 30 mg via INTRAVENOUS
  Administered 2021-06-28: 20 mg via INTRAVENOUS

## 2021-06-28 MED ORDER — CEFAZOLIN SODIUM-DEXTROSE 2-4 GM/100ML-% IV SOLN
2.0000 g | INTRAVENOUS | Status: AC
  Administered 2021-06-28: 2 g via INTRAVENOUS
  Filled 2021-06-28: qty 100

## 2021-06-28 MED ORDER — ALBUTEROL SULFATE HFA 108 (90 BASE) MCG/ACT IN AERS: 2.0000 | INHALATION_SPRAY | Freq: Four times a day (QID) | RESPIRATORY_TRACT | Status: DC | PRN

## 2021-06-28 MED ORDER — CHLORHEXIDINE GLUCONATE CLOTH 2 % EX PADS
6.0000 | MEDICATED_PAD | Freq: Once | CUTANEOUS | Status: DC
  Administered 2021-06-28: 6 via TOPICAL

## 2021-06-28 MED ORDER — CHLORHEXIDINE GLUCONATE CLOTH 2 % EX PADS: 6.0000 | MEDICATED_PAD | Freq: Once | CUTANEOUS | Status: DC

## 2021-06-28 MED ORDER — THROMBIN 5000 UNITS EX SOLR
OROMUCOSAL | Status: DC | PRN
  Administered 2021-06-28: 5 mL via TOPICAL

## 2021-06-28 MED ORDER — SUGAMMADEX SODIUM 200 MG/2ML IV SOLN
INTRAVENOUS | Status: DC | PRN
  Administered 2021-06-28: 200 mg via INTRAVENOUS

## 2021-06-28 MED ORDER — EPHEDRINE 5 MG/ML INJ
INTRAVENOUS | Status: AC
  Filled 2021-06-28: qty 20

## 2021-06-28 MED ORDER — DEXAMETHASONE SODIUM PHOSPHATE 10 MG/ML IJ SOLN
INTRAMUSCULAR | Status: AC
  Filled 2021-06-28: qty 1

## 2021-06-28 MED ORDER — HYDROCHLOROTHIAZIDE 25 MG PO TABS
25.0000 mg | ORAL_TABLET | Freq: Every day | ORAL | Status: DC
  Administered 2021-06-29: 25 mg via ORAL
  Filled 2021-06-28: qty 1

## 2021-06-28 MED ORDER — PROPOFOL 10 MG/ML IV BOLUS
INTRAVENOUS | Status: AC
  Filled 2021-06-28: qty 20

## 2021-06-28 MED ORDER — EZETIMIBE 10 MG PO TABS
10.0000 mg | ORAL_TABLET | Freq: Every day | ORAL | Status: DC
  Administered 2021-06-28 – 2021-06-29 (×2): 10 mg via ORAL
  Filled 2021-06-28 (×2): qty 1

## 2021-06-28 MED ORDER — LABETALOL HCL 5 MG/ML IV SOLN
INTRAVENOUS | Status: DC | PRN
  Administered 2021-06-28 (×2): 5 mg via INTRAVENOUS

## 2021-06-28 MED ORDER — MORPHINE SULFATE (PF) 2 MG/ML IV SOLN
1.0000 mg | INTRAVENOUS | Status: DC | PRN
Start: 2021-06-28 — End: 2021-06-29

## 2021-06-28 MED ORDER — DOCUSATE SODIUM 100 MG PO CAPS
100.0000 mg | ORAL_CAPSULE | Freq: Two times a day (BID) | ORAL | Status: DC
  Administered 2021-06-28 – 2021-06-29 (×2): 100 mg via ORAL
  Filled 2021-06-28 (×2): qty 1

## 2021-06-28 MED ORDER — POTASSIUM CHLORIDE CRYS ER 20 MEQ PO TBCR
40.0000 meq | EXTENDED_RELEASE_TABLET | Freq: Once | ORAL | Status: AC
  Administered 2021-06-29: 40 meq via ORAL
  Filled 2021-06-28: qty 2

## 2021-06-28 MED ORDER — 0.9 % SODIUM CHLORIDE (POUR BTL) OPTIME
TOPICAL | Status: DC | PRN
  Administered 2021-06-28: 1000 mL

## 2021-06-28 MED ORDER — PROPOFOL 10 MG/ML IV BOLUS
INTRAVENOUS | Status: DC | PRN
  Administered 2021-06-28: 20 mg via INTRAVENOUS
  Administered 2021-06-28: 180 mg via INTRAVENOUS

## 2021-06-28 MED ORDER — THROMBIN 5000 UNITS EX SOLR
CUTANEOUS | Status: AC
  Filled 2021-06-28: qty 5000

## 2021-06-28 MED ORDER — FENTANYL CITRATE (PF) 250 MCG/5ML IJ SOLN
INTRAMUSCULAR | Status: AC
  Filled 2021-06-28: qty 5

## 2021-06-28 MED ORDER — EPHEDRINE SULFATE-NACL 50-0.9 MG/10ML-% IV SOSY
PREFILLED_SYRINGE | INTRAVENOUS | Status: DC | PRN
  Administered 2021-06-28 (×2): 5 mg via INTRAVENOUS

## 2021-06-28 MED ORDER — BACITRACIN ZINC 500 UNIT/GM EX OINT
TOPICAL_OINTMENT | CUTANEOUS | Status: AC
  Filled 2021-06-28: qty 28.35

## 2021-06-28 MED ORDER — CLEVIDIPINE BUTYRATE 0.5 MG/ML IV EMUL
0.0000 mg/h | INTRAVENOUS | Status: DC
  Administered 2021-06-28: 16 mg/h via INTRAVENOUS
  Administered 2021-06-28: 8 mg/h via INTRAVENOUS
  Administered 2021-06-28: 16 mg/h via INTRAVENOUS
  Administered 2021-06-28: 10 mg/h via INTRAVENOUS
  Administered 2021-06-29: 15 mg/h via INTRAVENOUS
  Administered 2021-06-29: 16 mg/h via INTRAVENOUS
  Administered 2021-06-29: 21 mg/h via INTRAVENOUS
  Administered 2021-06-29: 16 mg/h via INTRAVENOUS
  Filled 2021-06-28: qty 50
  Filled 2021-06-28: qty 200
  Filled 2021-06-28 (×4): qty 50
  Filled 2021-06-28: qty 100

## 2021-06-28 MED ORDER — VITAMIN B-12 1000 MCG PO TABS
500.0000 ug | ORAL_TABLET | Freq: Every day | ORAL | Status: DC
  Administered 2021-06-28 – 2021-06-29 (×2): 500 ug via ORAL
  Filled 2021-06-28 (×2): qty 1

## 2021-06-28 MED ORDER — ROCURONIUM BROMIDE 10 MG/ML (PF) SYRINGE
PREFILLED_SYRINGE | INTRAVENOUS | Status: DC | PRN
  Administered 2021-06-28: 50 mg via INTRAVENOUS

## 2021-06-28 MED ORDER — LIDOCAINE 2% (20 MG/ML) 5 ML SYRINGE
INTRAMUSCULAR | Status: DC | PRN
  Administered 2021-06-28: 80 mg via INTRAVENOUS

## 2021-06-28 MED ORDER — ROCURONIUM BROMIDE 10 MG/ML (PF) SYRINGE
PREFILLED_SYRINGE | INTRAVENOUS | Status: AC
  Filled 2021-06-28: qty 30

## 2021-06-28 MED ORDER — FENTANYL CITRATE (PF) 250 MCG/5ML IJ SOLN
INTRAMUSCULAR | Status: DC | PRN
  Administered 2021-06-28 (×2): 100 ug via INTRAVENOUS
  Administered 2021-06-28: 50 ug via INTRAVENOUS

## 2021-06-28 MED ORDER — LIDOCAINE-EPINEPHRINE 1 %-1:100000 IJ SOLN
INTRAMUSCULAR | Status: AC
  Filled 2021-06-28: qty 1

## 2021-06-28 MED ORDER — LISINOPRIL 10 MG PO TABS
10.0000 mg | ORAL_TABLET | Freq: Every day | ORAL | Status: DC
  Administered 2021-06-29: 10 mg via ORAL
  Filled 2021-06-28: qty 1

## 2021-06-28 MED ORDER — ACETAMINOPHEN 325 MG PO TABS
650.0000 mg | ORAL_TABLET | ORAL | Status: DC | PRN
  Administered 2021-06-29: 650 mg via ORAL
  Filled 2021-06-28: qty 2

## 2021-06-28 MED ORDER — LABETALOL HCL 5 MG/ML IV SOLN
INTRAVENOUS | Status: AC
  Filled 2021-06-28: qty 4

## 2021-06-28 MED ORDER — EZETIMIBE-SIMVASTATIN 10-40 MG PO TABS: 1.0000 | ORAL_TABLET | Freq: Every day | ORAL | Status: DC

## 2021-06-28 MED ORDER — ACETAMINOPHEN 650 MG RE SUPP: 650.0000 mg | RECTAL | Status: DC | PRN

## 2021-06-28 MED ORDER — FLEET ENEMA 7-19 GM/118ML RE ENEM: 1.0000 | ENEMA | Freq: Once | RECTAL | Status: DC | PRN

## 2021-06-28 MED ORDER — LIDOCAINE-EPINEPHRINE 1 %-1:100000 IJ SOLN
INTRAMUSCULAR | Status: DC | PRN
  Administered 2021-06-28: 1 mL

## 2021-06-28 MED ORDER — ADULT MULTIVITAMIN W/MINERALS CH
1.0000 | ORAL_TABLET | Freq: Every day | ORAL | Status: DC
  Administered 2021-06-28 – 2021-06-29 (×2): 1 via ORAL
  Filled 2021-06-28 (×2): qty 1

## 2021-06-28 MED ORDER — FENTANYL CITRATE (PF) 100 MCG/2ML IJ SOLN: 25.0000 ug | INTRAMUSCULAR | Status: DC | PRN

## 2021-06-28 MED ORDER — ORAL CARE MOUTH RINSE: 15.0000 mL | Freq: Once | OROMUCOSAL | Status: AC

## 2021-06-28 MED ORDER — ALBUTEROL SULFATE (2.5 MG/3ML) 0.083% IN NEBU: 2.5000 mg | INHALATION_SOLUTION | Freq: Four times a day (QID) | RESPIRATORY_TRACT | Status: DC | PRN

## 2021-06-28 MED ORDER — LEVETIRACETAM 500 MG PO TABS
500.0000 mg | ORAL_TABLET | Freq: Two times a day (BID) | ORAL | Status: DC
  Administered 2021-06-28 – 2021-06-29 (×2): 500 mg via ORAL
  Filled 2021-06-28 (×2): qty 1

## 2021-06-28 MED ORDER — POLYETHYLENE GLYCOL 3350 17 G PO PACK: 17.0000 g | PACK | Freq: Every day | ORAL | Status: DC | PRN

## 2021-06-28 MED ORDER — CHLORHEXIDINE GLUCONATE CLOTH 2 % EX PADS
6.0000 | MEDICATED_PAD | Freq: Every day | CUTANEOUS | Status: DC
  Administered 2021-06-28: 6 via TOPICAL

## 2021-06-28 MED ORDER — DEXAMETHASONE SODIUM PHOSPHATE 10 MG/ML IJ SOLN
INTRAMUSCULAR | Status: DC | PRN
  Administered 2021-06-28: 10 mg via INTRAVENOUS

## 2021-06-28 MED ORDER — DEXAMETHASONE SODIUM PHOSPHATE 10 MG/ML IJ SOLN
6.0000 mg | Freq: Four times a day (QID) | INTRAMUSCULAR | Status: DC
  Administered 2021-06-28 – 2021-06-29 (×3): 6 mg via INTRAVENOUS
  Filled 2021-06-28 (×3): qty 1

## 2021-06-28 MED ORDER — LIDOCAINE 2% (20 MG/ML) 5 ML SYRINGE
INTRAMUSCULAR | Status: AC
  Filled 2021-06-28: qty 15

## 2021-06-28 MED ORDER — ONDANSETRON HCL 4 MG PO TABS: 4.0000 mg | ORAL_TABLET | ORAL | Status: DC | PRN

## 2021-06-28 MED ORDER — PHENYLEPHRINE 40 MCG/ML (10ML) SYRINGE FOR IV PUSH (FOR BLOOD PRESSURE SUPPORT)
PREFILLED_SYRINGE | INTRAVENOUS | Status: AC
  Filled 2021-06-28: qty 10

## 2021-06-28 MED ORDER — LEVOTHYROXINE SODIUM 88 MCG PO TABS
88.0000 ug | ORAL_TABLET | Freq: Every day | ORAL | Status: DC
  Administered 2021-06-29: 88 ug via ORAL
  Filled 2021-06-28: qty 1

## 2021-06-28 MED ORDER — LACTATED RINGERS IV SOLN: INTRAVENOUS | Status: DC

## 2021-06-28 MED ORDER — SODIUM CHLORIDE 0.9 % IV SOLN: INTRAVENOUS | Status: DC | PRN

## 2021-06-28 MED ORDER — MONTELUKAST SODIUM 10 MG PO TABS
10.0000 mg | ORAL_TABLET | Freq: Every day | ORAL | Status: DC
  Administered 2021-06-28: 10 mg via ORAL
  Filled 2021-06-28 (×2): qty 1

## 2021-06-28 MED ORDER — AMISULPRIDE (ANTIEMETIC) 5 MG/2ML IV SOLN: 10.0000 mg | Freq: Once | INTRAVENOUS | Status: DC | PRN

## 2021-06-28 MED ORDER — FLUTICASONE FUROATE-VILANTEROL 200-25 MCG/INH IN AEPB
1.0000 | INHALATION_SPRAY | Freq: Every day | RESPIRATORY_TRACT | Status: DC
  Filled 2021-06-28: qty 28

## 2021-06-28 MED ORDER — BACITRACIN ZINC 500 UNIT/GM EX OINT
TOPICAL_OINTMENT | CUTANEOUS | Status: DC | PRN
  Administered 2021-06-28: 1 via TOPICAL

## 2021-06-28 MED ORDER — CHLORHEXIDINE GLUCONATE 0.12 % MT SOLN
15.0000 mL | Freq: Once | OROMUCOSAL | Status: AC
  Administered 2021-06-28: 15 mL via OROMUCOSAL
  Filled 2021-06-28: qty 15

## 2021-06-28 MED ORDER — CEFAZOLIN SODIUM 1 G IJ SOLR
INTRAMUSCULAR | Status: AC
  Filled 2021-06-28: qty 20

## 2021-06-28 MED ORDER — ONDANSETRON HCL 4 MG/2ML IJ SOLN: 4.0000 mg | INTRAMUSCULAR | Status: DC | PRN

## 2021-06-28 MED ORDER — ONDANSETRON HCL 4 MG/2ML IJ SOLN
INTRAMUSCULAR | Status: AC
  Filled 2021-06-28: qty 4

## 2021-06-28 MED ORDER — ESMOLOL HCL 100 MG/10ML IV SOLN
INTRAVENOUS | Status: AC
  Filled 2021-06-28: qty 10

## 2021-06-28 MED ORDER — HYDROCODONE-ACETAMINOPHEN 5-325 MG PO TABS: 1.0000 | ORAL_TABLET | ORAL | Status: DC | PRN

## 2021-06-28 MED ORDER — ONDANSETRON HCL 4 MG/2ML IJ SOLN
INTRAMUSCULAR | Status: DC | PRN
  Administered 2021-06-28: 4 mg via INTRAVENOUS

## 2021-06-28 SURGICAL SUPPLY — 80 items
APL SKNCLS STERI-STRIP NONHPOA (GAUZE/BANDAGES/DRESSINGS)
BAG COUNTER SPONGE SURGICOUNT (BAG) ×2 IMPLANT
BAG SPNG CNTER NS LX DISP (BAG) ×1
BAG SURGICOUNT SPONGE COUNTING (BAG) ×1
BAND INSRT 18 STRL LF DISP RB (MISCELLANEOUS)
BAND RUBBER #18 3X1/16 STRL (MISCELLANEOUS) ×2 IMPLANT
BENZOIN TINCTURE PRP APPL 2/3 (GAUZE/BANDAGES/DRESSINGS) IMPLANT
BIT DRILL LONG 3.0X30 (BIT) ×1 IMPLANT
BIT DRILL LONG 3.0X30MM (BIT) ×1
BLADE CLIPPER SURG (BLADE) ×3 IMPLANT
BNDG CMPR 75X41 PLY HI ABS (GAUZE/BANDAGES/DRESSINGS)
BNDG GAUZE ELAST 4 BULKY (GAUZE/BANDAGES/DRESSINGS) IMPLANT
BNDG STRETCH 4X75 STRL LF (GAUZE/BANDAGES/DRESSINGS) IMPLANT
BUR ACORN 9.0 PRECISION (BURR) IMPLANT
BUR ACORN 9.0MM PRECISION (BURR)
BUR ROUND FLUTED 4 SOFT TCH (BURR) IMPLANT
BUR ROUND FLUTED 4MM SOFT TCH (BURR)
CANISTER SUCT 3000ML PPV (MISCELLANEOUS) ×6 IMPLANT
CATH FOLEY 2WAY SLVR  5CC 14FR (CATHETERS)
CATH FOLEY 2WAY SLVR 5CC 14FR (CATHETERS) IMPLANT
CNTNR URN SCR LID CUP LEK RST (MISCELLANEOUS) ×1 IMPLANT
CONT SPEC 4OZ STRL OR WHT (MISCELLANEOUS) ×3
COVER MAYO STAND STRL (DRAPES) IMPLANT
DECANTER SPIKE VIAL GLASS SM (MISCELLANEOUS) ×3 IMPLANT
DRAPE HALF SHEET 40X57 (DRAPES) ×3 IMPLANT
DRAPE ORTHO SPLIT 77X108 STRL (DRAPES) ×3
DRAPE SHEET LG 3/4 BI-LAMINATE (DRAPES) ×3 IMPLANT
DRAPE STERI IOBAN 125X83 (DRAPES) IMPLANT
DRAPE SURG 17X23 STRL (DRAPES) IMPLANT
DRAPE SURG ORHT 6 SPLT 77X108 (DRAPES) ×1 IMPLANT
DRAPE WARM FLUID 44X44 (DRAPES) ×3 IMPLANT
DRSG ADAPTIC 3X8 NADH LF (GAUZE/BANDAGES/DRESSINGS) ×2 IMPLANT
DRSG TELFA 3X8 NADH (GAUZE/BANDAGES/DRESSINGS) ×3 IMPLANT
DURAPREP 6ML APPLICATOR 50/CS (WOUND CARE) ×3 IMPLANT
ELECT COATED BLADE 2.86 ST (ELECTRODE) ×3 IMPLANT
ELECT REM PT RETURN 9FT ADLT (ELECTROSURGICAL) ×3
ELECTRODE REM PT RTRN 9FT ADLT (ELECTROSURGICAL) ×1 IMPLANT
GAUZE 4X4 16PLY ~~LOC~~+RFID DBL (SPONGE) IMPLANT
GAUZE SPONGE 4X4 12PLY STRL (GAUZE/BANDAGES/DRESSINGS) IMPLANT
GLOVE EXAM NITRILE LRG STRL (GLOVE) IMPLANT
GLOVE EXAM NITRILE XS STR PU (GLOVE) IMPLANT
GLOVE SURG ENC MOIS LTX SZ7.5 (GLOVE) ×6 IMPLANT
GLOVE SURG LTX SZ7.5 (GLOVE) ×4 IMPLANT
GLOVE SURG UNDER POLY LF SZ6.5 (GLOVE) ×4 IMPLANT
GLOVE SURG UNDER POLY LF SZ7.5 (GLOVE) ×5 IMPLANT
GOWN STRL REUS W/ TWL LRG LVL3 (GOWN DISPOSABLE) ×2 IMPLANT
GOWN STRL REUS W/ TWL XL LVL3 (GOWN DISPOSABLE) IMPLANT
GOWN STRL REUS W/TWL 2XL LVL3 (GOWN DISPOSABLE) IMPLANT
GOWN STRL REUS W/TWL LRG LVL3 (GOWN DISPOSABLE) ×3
GOWN STRL REUS W/TWL XL LVL3 (GOWN DISPOSABLE) ×3
HEMOSTAT POWDER SURGIFOAM 1G (HEMOSTASIS) ×2 IMPLANT
HEMOSTAT SURGICEL 2X14 (HEMOSTASIS) IMPLANT
KIT BASIN OR (CUSTOM PROCEDURE TRAY) ×3 IMPLANT
KIT NDL BIOPSY 1.8X235 CRAN (NEEDLE) ×1 IMPLANT
KIT NEEDLE BIOPSY 1.8X235 CRAN (NEEDLE) ×6 IMPLANT
KIT TURNOVER KIT B (KITS) ×3 IMPLANT
KIT VARIOGUIDE DRILL 1.9 (KITS) ×5 IMPLANT
MARKER SPHERE PSV REFLC 13MM (MARKER) ×8 IMPLANT
NDL SPNL 18GX3.5 QUINCKE PK (NEEDLE) ×1 IMPLANT
NEEDLE HYPO 22GX1.5 SAFETY (NEEDLE) ×3 IMPLANT
NEEDLE SPNL 18GX3.5 QUINCKE PK (NEEDLE) ×3 IMPLANT
NS IRRIG 1000ML POUR BTL (IV SOLUTION) ×5 IMPLANT
PACK CRANIOTOMY CUSTOM (CUSTOM PROCEDURE TRAY) ×3 IMPLANT
PIN MAYFIELD SKULL DISP (PIN) ×3 IMPLANT
SPONGE SURGIFOAM ABS GEL SZ50 (HEMOSTASIS) ×3 IMPLANT
STAPLER VISISTAT 35W (STAPLE) ×1 IMPLANT
SUT ETHILON 3 0 FSL (SUTURE) IMPLANT
SUT ETHILON 3 0 PS 1 (SUTURE) IMPLANT
SUT MON AB 3-0 SH 27 (SUTURE) ×3
SUT MON AB 3-0 SH27 (SUTURE) IMPLANT
SUT NURALON 4 0 TR CR/8 (SUTURE) IMPLANT
SUT SILK 0 TIES 10X30 (SUTURE) IMPLANT
SUT VIC AB 2-0 CP2 18 (SUTURE) IMPLANT
SUT VICRYL RAPIDE 3/0 (SUTURE) ×2 IMPLANT
SYR 3ML LL SCALE MARK (SYRINGE) ×9 IMPLANT
TOWEL GREEN STERILE (TOWEL DISPOSABLE) ×3 IMPLANT
TOWEL GREEN STERILE FF (TOWEL DISPOSABLE) ×3 IMPLANT
TRAY FOLEY MTR SLVR 16FR STAT (SET/KITS/TRAYS/PACK) ×1 IMPLANT
UNDERPAD 30X36 HEAVY ABSORB (UNDERPADS AND DIAPERS) ×3 IMPLANT
WATER STERILE IRR 1000ML POUR (IV SOLUTION) ×3 IMPLANT

## 2021-06-28 NOTE — Transfer of Care (Signed)
Immediate Anesthesia Transfer of Care Note  Patient: Adam Sullivan  Procedure(s) Performed: STEREOTACTIC FRONTAL BIOPSY OF CAUDATE LESION (Right) APPLICATION OF CRANIAL NAVIGATION (Right)  Patient Location: PACU  Anesthesia Type:General  Level of Consciousness: drowsy  Airway & Oxygen Therapy: Patient Spontanous Breathing and Patient connected to face mask oxygen  Post-op Assessment: Report given to RN, Post -op Vital signs reviewed and stable and Patient moving all extremities  Post vital signs: Reviewed and stable  Last Vitals:  Vitals Value Taken Time  BP 120/66 06/28/21 1537  Temp    Pulse 66 06/28/21 1540  Resp 14 06/28/21 1540  SpO2 100 % 06/28/21 1540  Vitals shown include unvalidated device data.  Last Pain:  Vitals:   06/28/21 1128  PainSc: 0-No pain         Complications: No notable events documented.

## 2021-06-28 NOTE — Anesthesia Procedure Notes (Addendum)
Procedure Name: Intubation Date/Time: 06/28/2021 1:50 PM Performed by: Kyung Rudd, CRNA Pre-anesthesia Checklist: Patient identified, Emergency Drugs available, Suction available and Patient being monitored Patient Re-evaluated:Patient Re-evaluated prior to induction Oxygen Delivery Method: Circle System Utilized Preoxygenation: Pre-oxygenation with 100% oxygen Induction Type: IV induction Ventilation: Mask ventilation without difficulty and Oral airway inserted - appropriate to patient size Laryngoscope Size: 3 and Glidescope Grade View: Grade I Tube type: Oral Tube size: 7.5 mm Number of attempts: 1 Airway Equipment and Method: Stylet and Oral airway Placement Confirmation: ETT inserted through vocal cords under direct vision, positive ETCO2 and breath sounds checked- equal and bilateral Secured at: 21 cm Tube secured with: Tape Dental Injury: Teeth and Oropharynx as per pre-operative assessment  Comments: Elective glidescope utilized due to decreased neck mobility Performed by Heide Scales, SRNA under direct supervision by Dr. Ola Spurr and Rhae Lerner, CRNA

## 2021-06-28 NOTE — Op Note (Signed)
Procedure(s): STEREOTACTIC FRONTAL BIOPSY OF CAUDATE LESION APPLICATION OF CRANIAL NAVIGATION Procedure Note  Adam Sullivan male 65 y.o. 06/28/2021  Procedure(s) and Anesthesia Type:    * STEREOTACTIC FRONTAL BIOPSY OF CAUDATE LESION - General    * APPLICATION OF CRANIAL NAVIGATION - General  Surgeon(s) and Role:    Marcello Moores, Dorcas Carrow, MD - Primary   Indications: This is a 65 year old man with a history of pineocytoma resection and VP shunt who developed progressive neurologic decline and was found to have multiple progressive enhancing lesions in his brain.  Most were subcentimeter and in eloquent areas.  The largest lesion was a 1.4 cm right caudate head lesion.  Metastatic work-up was negative.  After interdisciplinary discussion, stereotactic biopsy was recommended for diagnosis.  Risks, benefits, alternatives, and expected convalescence were discussed with the patient's wife as well as the patient who had significant cognitive deficits.  Risks discussed included, but were not limited to, bleeding, pain, infection, seizure, scar, stroke, spinal fluid leak, neurologic deficit, nondiagnostic tissue, shunt malfunction, coma, and death.  They wish to proceed with surgery.  Informed consent was obtained.   Surgeon: Vallarie Mare   Assistants: none  Anesthesia: General endotracheal anesthesia  Procedure Detail  STEREOTACTIC FRONTAL BIOPSY OF CAUDATE LESION, APPLICATION OF CRANIAL NAVIGATION  The patient is brought to the op room.  General anesthesia was induced and patient was intubated by the anesthesia service.  After appropriate lines and monitors placed, patient was positioned supine and head was placed in [place] head holder and affixed to the bed.  Using a thin Preoperative MRI, a 3D reconstruction was created which allowed for surface point registration with the [place] system.  A trajectory to the right caudate head lesion was planned on the [facility] computer, avoiding sulci  and cortical vessels and avoiding ventricular entry.  The entry point was mapped on the skin.  The scalp was preprepped with alcohol and prepped and draped in sterile fashion.  A timeout was performed.  Preoperative antibiotics were administered.  A small stab incision was made over the entry point.  The variable guide arm was locked in place using navigation and computer assistance.  A hand-held drill was then passed through the reducing cannula and used to drill a hole in the skull.  The dura was then punctured.  Stereotactic needle was then passed to the appropriate length to the right caudate head lesion.  Several samples were taken and sent for frozen section which returned as lesional tissue.  No significant bleeding was encountered.  The patient was kept normotensive during the biopsy.  The needle was then withdrawn.  The wound is irrigated.  The stab incision was closed with 2-0 Vicryl repeat stitches.  Patient was then removed from the [place] head holder.  He did require a small Monocryl stitch for pin site bleeding on the left.  He was extubated with anesthesia service moving all extremities.  All counts are correct at the end of surgery.  Findings: Frozen section consistent with lesional tissue, likely glial-based lesion.  Estimated Blood Loss:  Minimal         Drains: None         Total IV Fluids: None  Blood Given: none          Specimens: Right caudate head lesion         Implants: none        Complications:  * No complications entered in OR log *  Disposition: PACU - hemodynamically stable.         Condition: stable

## 2021-06-28 NOTE — Anesthesia Procedure Notes (Signed)
Arterial Line Insertion Start/End6/23/2022 1:20 PM, 06/28/2021 1:30 AM Performed by: Kyung Rudd, CRNA, CRNA  Preanesthetic checklist: patient identified, IV checked, site marked, risks and benefits discussed, surgical consent, monitors and equipment checked and pre-op evaluation Lidocaine 1% used for infiltration Left, radial was placed Catheter size: 20 G Hand hygiene performed , maximum sterile barriers used  and Seldinger technique used Allen's test indicative of satisfactory collateral circulation Attempts: 1 Procedure performed without using ultrasound guided technique. Following insertion, Biopatch and dressing applied. Post procedure assessment: normal

## 2021-06-28 NOTE — Progress Notes (Signed)
Potassium level this afternoon 3.3. Dr. Christella Noa notified, new verbal order for one time dose 100 meq potassium replacement PO.

## 2021-06-29 ENCOUNTER — Telehealth: Payer: Self-pay | Admitting: Internal Medicine

## 2021-06-29 ENCOUNTER — Inpatient Hospital Stay (HOSPITAL_COMMUNITY): Payer: 59

## 2021-06-29 ENCOUNTER — Encounter (HOSPITAL_COMMUNITY): Payer: Self-pay | Admitting: Neurosurgery

## 2021-06-29 MED ORDER — DEXAMETHASONE 4 MG PO TABS
4.0000 mg | ORAL_TABLET | Freq: Four times a day (QID) | ORAL | Status: DC
  Administered 2021-06-29: 4 mg via ORAL
  Filled 2021-06-29: qty 1

## 2021-06-29 MED ORDER — LEVETIRACETAM 500 MG PO TABS: 500.0000 mg | ORAL_TABLET | Freq: Two times a day (BID) | ORAL | 0 refills | Status: DC

## 2021-06-29 MED ORDER — DEXAMETHASONE 2 MG PO TABS: ORAL_TABLET | ORAL | 0 refills | Status: DC

## 2021-06-29 MED ORDER — DOCUSATE SODIUM 100 MG PO CAPS: 100.0000 mg | ORAL_CAPSULE | Freq: Two times a day (BID) | ORAL | 2 refills | Status: DC

## 2021-06-29 NOTE — Progress Notes (Signed)
Occupational Therapy Evaluation  PTA pt living at home with wife. Wife states she has noticed more cognitive problems over the past several months. Pt  has only recently stopped driving. Pt scored a 10 on the Short Blessed test, which places him in the impaired category (0-4 normal, 5-9 questionable impairment, 10- or more impaired). Demonstrates impairment with executive level cognitive skills, decreasing his safety and independence with ADL, functional mobility and IADL tasks. Recommend follow up with OT at the neuro outpt center. Feel pt would benefit from a Neuropsych consult as an outpt.     06/29/21 1100  OT Visit Information  Assistance Needed +1  History of Present Illness Pt is a 65 y.o. male who presented 6/29 for progressive neurologic decline and was found to have multiple progressive enhancing lesions in his brain. CT of head revealed progression of R caudate and basal ganglia tumors. Metastatic work-up was negative. He underwent elective biopsy of the caudate head lesion 6/30. PMH: In 1993, resection of a pineal cytoma postoperative large hemorrhage which required emergent surgery and he had a difficult recovery.  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 Other surgery with  intraoperative shunt placed; arthritis, asthma, brain tumors, depression, dyspnea, GERD, hypothyroidism, orchalgia, pneumonia, pre-diabetes, prostate cancer.  Precautions  Precautions Fall  Home Living  Family/patient expects to be discharged to: Private residence  Living Arrangements Spouse/significant other  Available Help at Discharge Family;Available 24 hours/day  Type of Home House  Home Access Stairs to enter  Entrance Stairs-Number of Steps 4  Entrance Stairs-Rails Right (ascending)  Home Layout One level  Bathroom Biomedical scientist Yes  How Accessible Accessible via walker  Home Equipment Transport chair    Lives With Significant other  Prior Function  Level of Independence Independent  Comments Uses walking stick on uneven surfaces. Pt and wife are retired. Pt worked as a Banker. Wife drives. Wife assists in donning of pants.  Communication  Communication Expressive difficulties (mumbles, per wife)  Pain Assessment  Pain Assessment No/denies pain  Cognition  Arousal/Alertness Lethargic  Behavior During Therapy Flat affect  Overall Cognitive Status Impaired/Different from baseline  Area of Impairment Orientation;Attention;Memory;Safety/judgement;Awareness;Problem solving  Orientation Level Disoriented to;Time ("Tuesday")  Current Attention Level Sustained  Memory Decreased short-term memory  Safety/Judgement Decreased awareness of safety;Decreased awareness of deficits  Awareness Emergent  Problem Solving Slow processing  General Comments Scored a 10 on the Short Blessed Test;poor working memory; attentional deficits; apparent difficulty with executive level skills; has recently stopped diving around 3 weeks ago. decreased abilit to self monitor behavior. therapist talking and pt pulling gown above mouth - repeated this behavior x 3  Upper Extremity Assessment  Upper Extremity Assessment LUE deficits/detail  LUE Deficits / Details Slight weakness, primarily in shoulder flexors, compared to R; Gross MMT scores of 4+ compared to 5 on R; holds L arm with elbow and fingers flexed during mobility; dysdiadochokinesia noted  LUE Sensation WNL  LUE Coordination decreased gross motor;decreased fine motor (using functionally)  Lower Extremity Assessment  LLE Deficits / Details Slight weakness, primarily in hip flexors and anterior tibialis, compared to R; Gross MMT scores of 4 to 4+ compared to 5 on R; dysdiadochokinesia and possible dysmetria noted  LLE Sensation WNL  LLE Coordination decreased gross motor;decreased fine motor  Cervical / Trunk Assessment  Cervical / Trunk  Assessment Kyphotic  ADL  Overall ADL's  Needs assistance/impaired  General ADL Comments minguard  with LB ADL; wife assists at baseline; S/set up with other ADL tasks  Vision- History  Baseline Vision/History Wears glasses  Wears Glasses Distance only  Patient Visual Report No change from baseline  Vision- Assessment  Additional Comments Pt wearing prism lens for distance due to vertical diplopia; poor visual attention adn control of sccadic eye movement  Perception  Comments apparent deficits  Praxis  Praxis tested? Deficits  Deficits Initiation  Bed Mobility  General bed mobility comments Pt sitting up in recliner upon arrival.  Transfers  Overall transfer level Needs assistance  Equipment used None  Transfers Sit to/from Stand  Sit to Stand Min guard  General transfer comment No LOB, extra time to power up to stand, min guard for safety.  Balance  Overall balance assessment Needs assistance  Sitting-balance support No upper extremity supported;Feet supported  Sitting balance-Leahy Scale Fair  Standing balance support No upper extremity supported;During functional activity  Standing balance-Leahy Scale Fair  OT - End of Session  Activity Tolerance Patient tolerated treatment well  Patient left in chair;with call bell/phone within reach;with chair alarm set;with family/visitor present  Nurse Communication Other (comment) (DC needs)  OT Assessment  OT Recommendation/Assessment All further OT needs can be met in the next venue of care  OT Visit Diagnosis Unsteadiness on feet (R26.81);Other abnormalities of gait and mobility (R26.89);Muscle weakness (generalized) (M62.81);Other symptoms and signs involving cognitive function  OT Problem List Decreased strength;Decreased activity tolerance;Impaired balance (sitting and/or standing);Impaired vision/perception;Decreased coordination;Decreased cognition;Decreased safety awareness;Decreased knowledge of use of DME or AE;Obesity  AM-PAC  OT "6 Clicks" Daily Activity Outcome Measure (Version 2)  Help from another person eating meals? 4  Help from another person taking care of personal grooming? 3  Help from another person toileting, which includes using toliet, bedpan, or urinal? 3  Help from another person bathing (including washing, rinsing, drying)? 3  Help from another person to put on and taking off regular upper body clothing? 3  Help from another person to put on and taking off regular lower body clothing? 3  6 Click Score 19  OT Recommendation  Recommendations for Other Services Other (comment) (Neuro psych consult as outpt)  Follow Up Recommendations Outpatient OT (neuro outpt)  OT Equipment Tub/shower bench  Individuals Consulted  Consulted and Agree with Results and Recommendations Patient;Family member/caregiver  Family Member Consulted wife  Acute Rehab OT Goals  Patient Stated Goal to get better  OT Goal Formulation All assessment and education complete, DC therapy  OT Time Calculation  OT Start Time (ACUTE ONLY) 1115  OT Stop Time (ACUTE ONLY) 1141  OT Time Calculation (min) 26 min  OT General Charges  $OT Visit 1 Visit  OT Evaluation  $OT Eval Moderate Complexity 1 Mod  OT Treatments  $Therapeutic Activity 8-22 mins  Written Expression  Dominant Hand Right  Maurie Boettcher, OT/L   Acute OT Clinical Specialist Acute Rehabilitation Services Pager [phone] Office [phone]

## 2021-06-29 NOTE — Discharge Instructions (Signed)
Can remove transparent dressing and shower 48 hours after surgery Walk as much as possible No heavy lifting >10 lbs Resume aspirin on 07/02/21

## 2021-06-29 NOTE — Evaluation (Signed)
Physical Therapy Evaluation Patient Details Name: Adam Sullivan MRN: [id number] DOB: September 03, 1956 Today's Date: 06/29/2021   History of Present Illness  Pt is a 65 y.o. male who presented 6/29 for progressive neurologic decline and was found to have multiple progressive enhancing lesions in his brain. CT of head revealed progression of R caudate and basal ganglia tumors. Metastatic work-up was negative. He underwent elective biopsy of the caudate head lesion 6/30. 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   Clinical Impression  Pt presents with condition above and deficits mentioned below, see PT Problem List. PTA, he was independent with functional mobility, needing his wife to assist with donning of his pants and to provide transportation. He would use a walking stick when negotiating uneven surfaces. Pt reporting increased difficulty getting words to be expressed correctly, sometimes taking multiple attempts to be successful. Currently, pt displays L-sided weakness and dysdiadochokinesia that impacts his balance. Pt with poor L foot clearance with gait, placing him at risk for falls. His DGI score of 13 further support he is at risk for falls. Educated pt and his wife to be aware to clear his L foot when changing terrains and to have supervision with mobility. Pt tends to adjust his speed and stance to accommodate for balance challenges to avoid LOB. Pt currently performing all functional mobility, including stairs, with min guard assist. Recommend follow-up with Outpatient PT, OT, and SLP in a neuro specialty clinic. Will continue to follow acutely.    Follow Up Recommendations Outpatient PT;Supervision for mobility/OOB (neuro specialty clinic)    Equipment Recommendations  None recommended by PT    Recommendations for Other  Services OT consult;Speech consult     Precautions / Restrictions Precautions Precautions: Fall Restrictions Weight Bearing Restrictions: No      Mobility  Bed Mobility               General bed mobility comments: Pt sitting up in recliner upon arrival.    Transfers Overall transfer level: Needs assistance Equipment used: None Transfers: Sit to/from Stand Sit to Stand: Min guard         General transfer comment: No LOB, extra time to power up to stand, min guard for safety.  Ambulation/Gait Ambulation/Gait assistance: Min guard Gait Distance (Feet): 215 Feet Assistive device: None Gait Pattern/deviations: Decreased stance time - left;Decreased dorsiflexion - left;Decreased step length - left;Step-through pattern;Decreased weight shift to left;Trunk flexed Gait velocity: reduced Gait velocity interpretation: 1.31 - 2.62 ft/sec, indicative of limited community ambulator General Gait Details: Pt ambulates at slow speed, displaying decreased L dorsiflexion/foot clearance, step length, and stance time along with excessive R lateral trunk flexion during L swing phase. Pt with kyphotic posture. Slight stagger but able to recover without physical assistance when changing head positions. Slows down to maintain balance with challenges of DGI. Min guard for safety. No hesitation or stuttering of feet noted with doorframes, stairs, etc.  Stairs Stairs: Yes Stairs assistance: Min guard Stair Management: One rail Right;One rail Left;Step to pattern;Forwards Number of Stairs: 4 General stair comments: Ascends with R rail, leading up with R leg, descends with L rail, leading down with L leg. Min guard for safety, no LOB.  Wheelchair Mobility    Modified Rankin (Stroke Patients Only) Modified Rankin (Stroke Patients Only) Pre-Morbid Rankin Score: Moderate disability Modified Rankin: Moderately severe disability     Balance Overall balance  assessment: Needs  assistance Sitting-balance support: No upper extremity supported;Feet supported Sitting balance-Leahy Scale: Fair     Standing balance support: No upper extremity supported;During functional activity Standing balance-Leahy Scale: Fair                   Standardized Balance Assessment Standardized Balance Assessment : Dynamic Gait Index   Dynamic Gait Index Level Surface: Mild Impairment Change in Gait Speed: Mild Impairment Gait with Horizontal Head Turns: Mild Impairment Gait with Vertical Head Turns: Moderate Impairment Gait and Pivot Turn: Mild Impairment Step Over Obstacle: Moderate Impairment Step Around Obstacles: Mild Impairment Steps: Moderate Impairment Total Score: 13       Pertinent Vitals/Pain Pain Assessment: No/denies pain    Home Living Family/patient expects to be discharged to:: Private residence Living Arrangements: Spouse/significant other Available Help at Discharge: Family;Available 24 hours/day Type of Home: House Home Access: Stairs to enter Entrance Stairs-Rails: Right (ascending) Entrance Stairs-Number of Steps: 4 Home Layout: One level Home Equipment: Transport chair      Prior Function Level of Independence: Independent         Comments: Uses walking stick on uneven surfaces. Pt and wife are retired. Pt worked as a Banker. Wife drives. Wife assists in donning of pants.     Hand Dominance   Dominant Hand: Right    Extremity/Trunk Assessment   Upper Extremity Assessment Upper Extremity Assessment: LUE deficits/detail LUE Deficits / Details: Slight weakness, primarily in shoulder flexors, compared to R; Gross MMT scores of 4+ compared to 5 on R; holds L arm with elbow and fingers flexed during mobility; dysdiadochokinesia noted LUE Sensation: WNL LUE Coordination: decreased gross motor;decreased fine motor    Lower Extremity Assessment Lower Extremity Assessment: LLE deficits/detail LLE Deficits / Details:  Slight weakness, primarily in hip flexors and anterior tibialis, compared to R; Gross MMT scores of 4 to 4+ compared to 5 on R; dysdiadochokinesia and possible dysmetria noted LLE Sensation: WNL LLE Coordination: decreased gross motor;decreased fine motor    Cervical / Trunk Assessment Cervical / Trunk Assessment: Kyphotic  Communication   Communication: Expressive difficulties (mumbles, per wife)  Cognition Arousal/Alertness: Lethargic Behavior During Therapy: WFL for tasks assessed/performed Overall Cognitive Status: History of cognitive impairments - at baseline                                 General Comments: Hx of memory deficits. Does not drive due to issues with memory etc. Pt lethargic with wife reporting he had been being woken up all night so has not had much sleep.      General Comments General comments (skin integrity, edema, etc.): Provided handout on managing edema in legs and arms; provided HEP handout consisting of step ups with UE support, standing marching with support, seated ankle dorsiflexion, shoulder flexion AROM with weights, and hand ball squeezes    Exercises     Assessment/Plan    PT Assessment Patient needs continued PT services  PT Problem List Decreased strength;Decreased range of motion;Decreased activity tolerance;Decreased balance;Decreased mobility;Decreased coordination;Decreased cognition       PT Treatment Interventions DME instruction;Gait training;Stair training;Functional mobility training;Therapeutic activities;Therapeutic exercise;Balance training;Neuromuscular re-education;Cognitive remediation;Patient/family education    PT Goals (Current goals can be found in the Care Plan section)  Acute Rehab PT Goals Patient Stated Goal: to get better PT Goal Formulation: With patient/family Time For Goal Achievement: 07/13/21 Potential to Achieve Goals: Good  Frequency Min 4X/week   Barriers to discharge         Co-evaluation               AM-PAC PT "6 Clicks" Mobility  Outcome Measure Help needed turning from your back to your side while in a flat bed without using bedrails?: A Little Help needed moving from lying on your back to sitting on the side of a flat bed without using bedrails?: A Little Help needed moving to and from a bed to a chair (including a wheelchair)?: A Little Help needed standing up from a chair using your arms (e.g., wheelchair or bedside chair)?: A Little Help needed to walk in hospital room?: A Little Help needed climbing 3-5 steps with a railing? : A Little 6 Click Score: 18    End of Session Equipment Utilized During Treatment: Gait belt Activity Tolerance: Patient tolerated treatment well Patient left: in chair;with call bell/phone within reach;with chair alarm set;with family/visitor present;with nursing/sitter in room Nurse Communication: Mobility status PT Visit Diagnosis: Unsteadiness on feet (R26.81);Other abnormalities of gait and mobility (R26.89);Muscle weakness (generalized) (M62.81);Difficulty in walking, not elsewhere classified (R26.2);Other symptoms and signs involving the nervous system (R29.898);Hemiplegia and hemiparesis Hemiplegia - Right/Left: Left Hemiplegia - dominant/non-dominant: Non-dominant 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

## 2021-06-29 NOTE — Anesthesia Postprocedure Evaluation (Signed)
Anesthesia Post Note  Patient: Adam Sullivan  Procedure(s) Performed: STEREOTACTIC FRONTAL BIOPSY OF CAUDATE LESION (Right) APPLICATION OF CRANIAL NAVIGATION (Right)     Patient location during evaluation: PACU Anesthesia Type: General Level of consciousness: awake and alert Pain management: pain level controlled Vital Signs Assessment: post-procedure vital signs reviewed and stable Respiratory status: spontaneous breathing, nonlabored ventilation, respiratory function stable and patient connected to nasal cannula oxygen Cardiovascular status: blood pressure returned to baseline and stable Postop Assessment: no apparent nausea or vomiting Anesthetic complications: no   No notable events documented.  Last Vitals:  Vitals:   06/29/21 0930 06/29/21 1000  BP: 124/73 130/83  Pulse: 79 68  Resp: 16 15  Temp:    SpO2: 98% 97%    Last Pain:  Vitals:   06/29/21 0400  TempSrc: Oral  PainSc: Asleep                 Tiajuana Amass

## 2021-06-29 NOTE — Evaluation (Signed)
Speech Language Pathology Evaluation Patient Details Name: Adam Sullivan MRN: [id number] DOB: 09/20/1956 Today's Date: 06/29/2021 Time: 2353-6144 SLP Time Calculation (min) (ACUTE ONLY): 31 min  Problem List:  Patient Active Problem List   Diagnosis Date Noted   Brain lesion 06/28/2021   Right frontal lobe lesion 06/28/2021   Brain mass 05/31/2021   History of CVA (cerebrovascular accident) 03/08/2020   Fatigue 02/10/2020   Memory loss 02/10/2020   Depression 02/10/2020   Tinea cruris 01/28/2020   Left shoulder tendinitis 10/13/2018   Gait abnormality 09/21/2018   Poor short-term memory 09/21/2018   Drooping eyelid disease, left 09/21/2018   History of prostate cancer 09/23/2016   Essential hypertension 09/23/2016   Diabetes mellitus without complication ([place]) 31/54/0086   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/2011   GERD (gastroesophageal reflux disease) 06/04/2011   Dyslipidemia 06/04/2011   Headache disorder 06/04/2011   Hypothyroidism 03/18/2008   Asthma 03/18/2008   Past Medical History:  Past Medical History:  Diagnosis Date   Allergy    Anxiety    Arthritis    Asthma    severe, Dr. Linna Darner   Blood clots in brain    history of    Brain tumor ([place]) 12/31/1991   Depression    situational   Dyspnea    GERD (gastroesophageal reflux disease)    severe   Headache(784.0)    Hyperlipidemia    Hypothyroidism    Orchalgia    Pneumonia    hx of   Pre-diabetes    Prostate cancer ([place]) 07/01/2013   gleason 6   Thyroid disease    hypothyroidism   Past Surgical History:  Past Surgical History:  Procedure Laterality Date   APPLICATION OF CRANIAL NAVIGATION Right 06/28/2021   Procedure: APPLICATION OF CRANIAL NAVIGATION;  Surgeon: Vallarie Mare, MD;  Location: [facility];  Service: Neurosurgery;  Laterality: Right;   brain shunt  12/31/1991   put in, removed, put in again   [place] Right 06/28/2021   Procedure: STEREOTACTIC FRONTAL BIOPSY OF CAUDATE LESION;  Surgeon: Vallarie Mare, MD;  Location: [facility];  Service: Neurosurgery;  Laterality: Right;   LYMPHADENECTOMY Bilateral 10/22/2013   Procedure: LYMPHADENECTOMY;  Surgeon: Alexis Frock, MD;  Location: [facility];  Service: Urology;  Laterality: Bilateral;   removal brain tumor  12/31/1991   pineal tumor, benign   removed blood clot     X 2   ROBOT ASSISTED LAPAROSCOPIC RADICAL PROSTATECTOMY N/A 10/22/2013   Procedure: ROBOTIC ASSISTED LAPAROSCOPIC RADICAL PROSTATECTOMY   PRE- PERITONEAL APPROACH;  Surgeon: Alexis Frock, MD;  Location: [facility];  Service: Urology;  Laterality: N/A;  3.5 HRS    HPI:  Pt is a 65 y.o. 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 CT of head revealed progression of R caudate and basal ganglia tumors. Metastatic work-up was negative. He underwent elective biopsy of the caudate head lesion 6/30. 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   Assessment / Plan / Recommendation Clinical Impression  Pt participated in cognitive assessment with wife at bedside. He has demonstrated subtle changes in cognition the last several months.  Today he presents with mild impairments in  selective and alternating attention, Research officer, political party, working Marine scientist, and Archivist. We discussed results and recommendations for f/u evaluation at [facility] with SLP.  Mr Hewitt agrees with plan and the benefit of learning some compensatory strategies to enhance participation and independence at home. D/C pending today.    SLP Assessment  SLP Recommendation/Assessment: All further Speech  [place] Pathology  needs can be addressed in the next venue of care SLP Visit Diagnosis: Cognitive communication deficit (R41.841)    Follow Up Recommendations  Outpatient SLP    Frequency and Duration           SLP Evaluation Cognition  Overall Cognitive Status: Impaired/Different from baseline Arousal/Alertness: Awake/alert Orientation Level: Oriented X4 Attention: Selective Selective Attention: Impaired Selective Attention Impairment: Verbal basic Memory: Impaired Memory Impairment: Retrieval deficit Awareness: Impaired Problem Solving: Impaired Problem Solving Impairment: Verbal basic Executive Function: Organizing Organizing: Impaired Organizing Impairment: Verbal basic       Comprehension  Auditory Comprehension Overall Auditory Comprehension: Appears within functional limits for tasks assessed Reading Comprehension Reading Status: Not tested    Expression Expression Primary Mode of Expression: Verbal Verbal Expression Overall Verbal Expression: Appears within functional limits for tasks assessed Written Expression Dominant Hand: Right Written Expression: Not tested   Oral / Motor  Motor Speech Overall Motor Speech: Impaired Respiration: Within functional limits Phonation: Normal Resonance: Within functional limits Articulation: Impaired Level of Impairment: Conversation Intelligibility: Intelligible   GO                   Lula Kolton L. Tivis Ringer, [place] Office number [phone] Pager [phone]  Juan Quam Laurice 06/29/2021, 11:42 AM

## 2021-06-29 NOTE — Telephone Encounter (Signed)
   Patients wife said that the patients mobility has decreased. She is wanting to know what they need to do to  get a handicap placard. Please advise

## 2021-06-29 NOTE — Discharge Summary (Signed)
Physician Discharge Summary  Patient ID: OSLO HUNTSMAN MRN: [id number] DOB/AGE: May 24, 1956 65 y.o.  Admit date: 06/28/2021 Discharge date: 06/29/2021  Admission Diagnoses:  Right frontal lobe lesion  Discharge Diagnoses:  Same Active Problems:   Brain lesion   Right frontal lobe lesion   Discharged Condition: Stable  Hospital Course:  Adam Sullivan is a 65 y.o. male Twith a history of pineocytoma resection and VP shunt who developed progressive neurologic decline and was found to have multiple progressive enhancing lesions in his brain.  Most were subcentimeter and in eloquent areas.  The largest lesion was a 1.4 cm right caudate head lesion.  Metastatic work-up was negative. He underwent elective biopsy of the caudate head lesion.  Postoperatively he was admitted to the neuro ICU for monitoring.  His postop head CT showed no significant hemorrhage or complication.  He remained at his neurologic baseline and was deemed ready for D/C on 7/1.  Treatments: Surgery - stereotactic biopsy of right caudate head lesion  Discharge Exam: Blood pressure 117/60, pulse 85, temperature 98.5 F (36.9 C), temperature source Oral, resp. rate 17, height 5\' 6"  (1.676 m), weight 84 kg, SpO2 96 %. Awake, alert, oriented to person.  Moderate cognitive deficits present Speech fluent, appropriate CN grossly intact 5/5 BUE/BLE Wound c/d/i  Disposition: Discharge disposition: 01-Home or Self Care        Allergies as of 06/29/2021       Reactions   Chocolate    Unsure of reaction   Dust Mite Extract    Asthma   Erythromycin Other (See Comments)   Pt unsure of what the reaction was. Happen as a child.    Kola Nut    Can not drink pepsi or coca cola, unsure of reaction        Medication List     STOP taking these medications    aspirin 81 MG tablet       TAKE these medications    CENTRUM SILVER 50+MEN PO Take 1 tablet by mouth daily.   cetirizine 10 MG tablet Commonly known as:  ZYRTEC Take 10 mg by mouth daily.   dexamethasone 2 MG tablet Commonly known as: DECADRON Take 2 tablets (4 mg total) by mouth every 6 (six) hours for 3 days, THEN 2 tablets (4 mg total) every 8 (eight) hours for 3 days, THEN 1 tablet (2 mg total) every 8 (eight) hours for 3 days, THEN 1 tablet (2 mg total) 2 (two) times daily for 3 days, THEN 0.5 tablets (1 mg total) 2 (two) times daily for 3 days, THEN 0.5 tablets (1 mg total) daily for 3 days. Start taking on: June 29, 2021   docusate sodium 100 MG capsule Commonly known as: COLACE Take 1 capsule (100 mg total) by mouth 2 (two) times daily.   ezetimibe-simvastatin 10-40 MG tablet Commonly known as: VYTORIN Take 1 tablet by mouth at bedtime.   hydrochlorothiazide 25 MG tablet Commonly known as: HYDRODIURIL Take 1 tablet (25 mg total) by mouth daily.   levETIRAcetam 500 MG tablet Commonly known as: KEPPRA Take 1 tablet (500 mg total) by mouth 2 (two) times daily for 7 days.   lisinopril 10 MG tablet Commonly known as: ZESTRIL Take 1 tablet (10 mg total) by mouth daily.   montelukast 10 MG tablet Commonly known as: SINGULAIR Take 1 tablet (10 mg total) by mouth at bedtime.   pantoprazole 40 MG tablet Commonly known as: PROTONIX Take 1 tablet (40 mg total) by mouth  daily.   vitamin B-12 500 MCG tablet Commonly known as: CYANOCOBALAMIN Take 500 mcg by mouth daily.       ASK your doctor about these medications    Advair Diskus 250-50 MCG/ACT Aepb Generic drug: fluticasone-salmeterol USE 1 PUFF EVERY 12 HOURS.   albuterol 108 (90 Base) MCG/ACT inhaler Commonly known as: ProAir HFA USE @ PUFFS EVERY 6 HOURS AS NEEDED FOR WHEEZING   levothyroxine 88 MCG tablet Commonly known as: Synthroid TAKE 1 TABLET ONCE DAILY IN THE MORNING BEFORE BREAKFAST.        Follow-up Information     Vallarie Mare, MD Follow up in 2 week(s).   Specialty: Neurosurgery Contact information: [address] [place]  [place] [address] [phone]                 Signed: Vallarie Mare 06/29/2021, 8:10 AM

## 2021-07-03 NOTE — Telephone Encounter (Signed)
Have already filled out the form.  I answered MyChart letting her know we would mail this to her.

## 2021-07-06 ENCOUNTER — Emergency Department (HOSPITAL_COMMUNITY): Payer: 59

## 2021-07-06 ENCOUNTER — Inpatient Hospital Stay (HOSPITAL_COMMUNITY)
Admission: EM | Admit: 2021-07-06 | Discharge: 2021-07-08 | DRG: 065 | Disposition: A | Discharge status: Home / Self Care | Payer: 59 | Attending: Student | Admitting: Student

## 2021-07-06 ENCOUNTER — Other Ambulatory Visit: Payer: Self-pay

## 2021-07-06 ENCOUNTER — Observation Stay (HOSPITAL_COMMUNITY): Payer: 59

## 2021-07-06 ENCOUNTER — Encounter (HOSPITAL_COMMUNITY): Payer: Self-pay | Admitting: Emergency Medicine

## 2021-07-06 DIAGNOSIS — Z881 Allergy status to other antibiotic agents status: Secondary | ICD-10-CM

## 2021-07-06 DIAGNOSIS — I10 Essential (primary) hypertension: Secondary | ICD-10-CM

## 2021-07-06 DIAGNOSIS — Z20822 Contact with and (suspected) exposure to covid-19: Secondary | ICD-10-CM | POA: Diagnosis present

## 2021-07-06 DIAGNOSIS — T502X5A Adverse effect of carbonic-anhydrase inhibitors, benzothiadiazides and other diuretics, initial encounter: Secondary | ICD-10-CM | POA: Diagnosis present

## 2021-07-06 DIAGNOSIS — I639 Cerebral infarction, unspecified: Secondary | ICD-10-CM

## 2021-07-06 DIAGNOSIS — R7303 Prediabetes: Secondary | ICD-10-CM | POA: Diagnosis present

## 2021-07-06 DIAGNOSIS — J45909 Unspecified asthma, uncomplicated: Secondary | ICD-10-CM | POA: Diagnosis present

## 2021-07-06 DIAGNOSIS — Z91018 Allergy to other foods: Secondary | ICD-10-CM

## 2021-07-06 DIAGNOSIS — Z825 Family history of asthma and other chronic lower respiratory diseases: Secondary | ICD-10-CM

## 2021-07-06 DIAGNOSIS — R269 Unspecified abnormalities of gait and mobility: Secondary | ICD-10-CM | POA: Diagnosis present

## 2021-07-06 DIAGNOSIS — Z79890 Hormone replacement therapy (postmenopausal): Secondary | ICD-10-CM

## 2021-07-06 DIAGNOSIS — K219 Gastro-esophageal reflux disease without esophagitis: Secondary | ICD-10-CM | POA: Diagnosis present

## 2021-07-06 DIAGNOSIS — Z79899 Other long term (current) drug therapy: Secondary | ICD-10-CM

## 2021-07-06 DIAGNOSIS — T380X5A Adverse effect of glucocorticoids and synthetic analogues, initial encounter: Secondary | ICD-10-CM | POA: Diagnosis present

## 2021-07-06 DIAGNOSIS — R739 Hyperglycemia, unspecified: Secondary | ICD-10-CM | POA: Diagnosis present

## 2021-07-06 DIAGNOSIS — J4520 Mild intermittent asthma, uncomplicated: Secondary | ICD-10-CM

## 2021-07-06 DIAGNOSIS — E871 Hypo-osmolality and hyponatremia: Secondary | ICD-10-CM | POA: Diagnosis present

## 2021-07-06 DIAGNOSIS — Z8249 Family history of ischemic heart disease and other diseases of the circulatory system: Secondary | ICD-10-CM

## 2021-07-06 DIAGNOSIS — Z9079 Acquired absence of other genital organ(s): Secondary | ICD-10-CM

## 2021-07-06 DIAGNOSIS — E785 Hyperlipidemia, unspecified: Secondary | ICD-10-CM | POA: Diagnosis present

## 2021-07-06 DIAGNOSIS — I6350 Cerebral infarction due to unspecified occlusion or stenosis of unspecified cerebral artery: Secondary | ICD-10-CM | POA: Diagnosis not present

## 2021-07-06 DIAGNOSIS — Z8673 Personal history of transient ischemic attack (TIA), and cerebral infarction without residual deficits: Secondary | ICD-10-CM

## 2021-07-06 DIAGNOSIS — Z91048 Other nonmedicinal substance allergy status: Secondary | ICD-10-CM

## 2021-07-06 DIAGNOSIS — E039 Hypothyroidism, unspecified: Secondary | ICD-10-CM | POA: Diagnosis present

## 2021-07-06 DIAGNOSIS — R4701 Aphasia: Secondary | ICD-10-CM | POA: Diagnosis present

## 2021-07-06 DIAGNOSIS — E861 Hypovolemia: Secondary | ICD-10-CM | POA: Diagnosis present

## 2021-07-06 DIAGNOSIS — Z7951 Long term (current) use of inhaled steroids: Secondary | ICD-10-CM

## 2021-07-06 DIAGNOSIS — R29702 NIHSS score 2: Secondary | ICD-10-CM | POA: Diagnosis present

## 2021-07-06 DIAGNOSIS — Z8546 Personal history of malignant neoplasm of prostate: Secondary | ICD-10-CM

## 2021-07-06 DIAGNOSIS — Z923 Personal history of irradiation: Secondary | ICD-10-CM

## 2021-07-06 LAB — COMPREHENSIVE METABOLIC PANEL
ALT: 84 U/L — ABNORMAL HIGH (ref 0–44)
AST: 28 U/L (ref 15–41)
Albumin: 3.1 g/dL — ABNORMAL LOW (ref 3.5–5.0)
Alkaline Phosphatase: 46 U/L (ref 38–126)
Anion gap: 8 (ref 5–15)
BUN: 30 mg/dL — ABNORMAL HIGH (ref 8–23)
CO2: 25 mmol/L (ref 22–32)
Calcium: 8.3 mg/dL — ABNORMAL LOW (ref 8.9–10.3)
Chloride: 96 mmol/L — ABNORMAL LOW (ref 98–111)
Creatinine, Ser: 0.77 mg/dL (ref 0.61–1.24)
GFR, Estimated: >60 mL/min (ref >60)
Glucose, Bld: 161 mg/dL — ABNORMAL HIGH (ref 70–99)
Potassium: 3.7 mmol/L (ref 3.5–5.1)
Sodium: 129 mmol/L — ABNORMAL LOW (ref 135–145)
Total Bilirubin: 0.7 mg/dL (ref 0.3–1.2)
Total Protein: 5.8 g/dL — ABNORMAL LOW (ref 6.5–8.1)

## 2021-07-06 LAB — CBC WITH DIFFERENTIAL/PLATELET
Abs Immature Granulocytes: 0.94 10*3/uL — ABNORMAL HIGH (ref 0.00–0.07)
Basophils Absolute: 0 10*3/uL (ref 0.0–0.1)
Basophils Relative: 0 %
Eosinophils Absolute: 0 10*3/uL (ref 0.0–0.5)
Eosinophils Relative: 0 %
HCT: 38.6 % — ABNORMAL LOW (ref 39.0–52.0)
Hemoglobin: 13.1 g/dL (ref 13.0–17.0)
Immature Granulocytes: 5 %
Lymphocytes Relative: 7 %
Lymphs Abs: 1.3 10*3/uL (ref 0.7–4.0)
MCH: 29.3 pg (ref 26.0–34.0)
MCHC: 33.9 g/dL (ref 30.0–36.0)
MCV: 86.4 fL (ref 80.0–100.0)
Monocytes Absolute: 1.2 10*3/uL — ABNORMAL HIGH (ref 0.1–1.0)
Monocytes Relative: 6 %
Neutro Abs: 14.8 10*3/uL — ABNORMAL HIGH (ref 1.7–7.7)
Neutrophils Relative %: 82 %
Platelets: 306 10*3/uL (ref 150–400)
RBC: 4.47 MIL/uL (ref 4.22–5.81)
RDW: 14.6 % (ref 11.5–15.5)
WBC: 18.2 10*3/uL — ABNORMAL HIGH (ref 4.0–10.5)
nRBC: 0 % (ref 0.0–0.2)

## 2021-07-06 LAB — CBG MONITORING, ED: Glucose-Capillary: 115 mg/dL — ABNORMAL HIGH (ref 70–99)

## 2021-07-06 LAB — TSH: TSH: 0.057 u[IU]/mL — ABNORMAL LOW (ref 0.350–4.500)

## 2021-07-06 LAB — T4, FREE: Free T4: 1.43 ng/dL — ABNORMAL HIGH (ref 0.61–1.12)

## 2021-07-06 MED ORDER — DEXAMETHASONE 4 MG PO TABS: 4.0000 mg | ORAL_TABLET | Freq: Every day | ORAL | Status: DC

## 2021-07-06 MED ORDER — VITAMIN B-12 100 MCG PO TABS
500.0000 ug | ORAL_TABLET | Freq: Every evening | ORAL | Status: DC
  Administered 2021-07-07: 500 ug via ORAL
  Filled 2021-07-06: qty 5

## 2021-07-06 MED ORDER — DEXAMETHASONE 2 MG PO TABS: 1.0000 mg | ORAL_TABLET | Freq: Two times a day (BID) | ORAL | Status: DC

## 2021-07-06 MED ORDER — MOMETASONE FURO-FORMOTEROL FUM 200-5 MCG/ACT IN AERO
2.0000 | INHALATION_SPRAY | Freq: Two times a day (BID) | RESPIRATORY_TRACT | Status: DC
  Administered 2021-07-07 – 2021-07-08 (×2): 2 via RESPIRATORY_TRACT
  Filled 2021-07-06 (×2): qty 8.8

## 2021-07-06 MED ORDER — POLYETHYLENE GLYCOL 3350 17 G PO PACK
17.0000 g | PACK | Freq: Every day | ORAL | Status: DC | PRN
Start: 2021-07-06 — End: 2021-07-08

## 2021-07-06 MED ORDER — LEVOTHYROXINE SODIUM 88 MCG PO TABS
88.0000 ug | ORAL_TABLET | Freq: Every day | ORAL | Status: DC
  Filled 2021-07-06: qty 1

## 2021-07-06 MED ORDER — PANTOPRAZOLE SODIUM 40 MG PO TBEC: 40.0000 mg | DELAYED_RELEASE_TABLET | Freq: Every day | ORAL | Status: DC

## 2021-07-06 MED ORDER — ALBUTEROL SULFATE (2.5 MG/3ML) 0.083% IN NEBU: 2.5000 mg | INHALATION_SOLUTION | Freq: Four times a day (QID) | RESPIRATORY_TRACT | Status: DC | PRN

## 2021-07-06 MED ORDER — ACETAMINOPHEN 325 MG PO TABS: 650.0000 mg | ORAL_TABLET | Freq: Four times a day (QID) | ORAL | Status: DC | PRN

## 2021-07-06 MED ORDER — EZETIMIBE 10 MG PO TABS
10.0000 mg | ORAL_TABLET | Freq: Every day | ORAL | Status: DC
  Administered 2021-07-08: 10 mg via ORAL
  Filled 2021-07-06 (×3): qty 1

## 2021-07-06 MED ORDER — DEXAMETHASONE 2 MG PO TABS: 2.0000 mg | ORAL_TABLET | Freq: Three times a day (TID) | ORAL | Status: DC

## 2021-07-06 MED ORDER — ONDANSETRON HCL 4 MG/2ML IJ SOLN: 4.0000 mg | Freq: Four times a day (QID) | INTRAMUSCULAR | Status: DC | PRN

## 2021-07-06 MED ORDER — LABETALOL HCL 5 MG/ML IV SOLN: 5.0000 mg | INTRAVENOUS | Status: DC | PRN

## 2021-07-06 MED ORDER — DEXAMETHASONE 2 MG PO TABS: 1.0000 mg | ORAL_TABLET | Freq: Every day | ORAL | Status: DC

## 2021-07-06 MED ORDER — INSULIN ASPART 100 UNIT/ML IJ SOLN: 0.0000 [IU] | Freq: Every day | INTRAMUSCULAR | Status: DC

## 2021-07-06 MED ORDER — INSULIN ASPART 100 UNIT/ML IJ SOLN: 0.0000 [IU] | Freq: Three times a day (TID) | INTRAMUSCULAR | Status: DC

## 2021-07-06 MED ORDER — ADULT MULTIVITAMIN W/MINERALS CH
1.0000 | ORAL_TABLET | Freq: Every evening | ORAL | Status: DC
  Administered 2021-07-07: 1 via ORAL
  Filled 2021-07-06: qty 1

## 2021-07-06 MED ORDER — GADOBUTROL 1 MMOL/ML IV SOLN
8.0000 mL | Freq: Once | INTRAVENOUS | Status: AC | PRN
  Administered 2021-07-06: 8 mL via INTRAVENOUS

## 2021-07-06 MED ORDER — MONTELUKAST SODIUM 10 MG PO TABS
10.0000 mg | ORAL_TABLET | Freq: Every day | ORAL | Status: DC
  Administered 2021-07-07: 10 mg via ORAL
  Filled 2021-07-06 (×2): qty 1

## 2021-07-06 MED ORDER — SIMVASTATIN 20 MG PO TABS
40.0000 mg | ORAL_TABLET | Freq: Every day | ORAL | Status: DC
  Administered 2021-07-07: 40 mg via ORAL
  Filled 2021-07-06: qty 2

## 2021-07-06 MED ORDER — MELATONIN 3 MG PO TABS
3.0000 mg | ORAL_TABLET | Freq: Every evening | ORAL | Status: DC | PRN
  Administered 2021-07-07: 3 mg via ORAL
  Filled 2021-07-06: qty 1

## 2021-07-06 MED ORDER — ENOXAPARIN SODIUM 40 MG/0.4ML IJ SOSY
40.0000 mg | PREFILLED_SYRINGE | INTRAMUSCULAR | Status: DC
  Administered 2021-07-07 – 2021-07-08 (×2): 40 mg via SUBCUTANEOUS
  Filled 2021-07-06 (×2): qty 0.4

## 2021-07-06 MED ORDER — STROKE: EARLY STAGES OF RECOVERY BOOK
Freq: Once | Status: AC
  Filled 2021-07-06: qty 1

## 2021-07-06 MED ORDER — DEXAMETHASONE 2 MG PO TABS: 2.0000 mg | ORAL_TABLET | Freq: Two times a day (BID) | ORAL | Status: DC

## 2021-07-06 MED ORDER — EZETIMIBE-SIMVASTATIN 10-40 MG PO TABS: 1.0000 | ORAL_TABLET | Freq: Every day | ORAL | Status: DC

## 2021-07-06 MED ORDER — LEVETIRACETAM 500 MG PO TABS: 500.0000 mg | ORAL_TABLET | Freq: Two times a day (BID) | ORAL | Status: DC

## 2021-07-06 NOTE — ED Provider Notes (Signed)
[facility] EMERGENCY DEPARTMENT Provider Note   CSN: [id number] Arrival date & time: 07/06/21  1158     History Chief Complaint  Patient presents with   Altered Mental Status    GARALD RHEW is a 65 y.o. male.  HPI Adult male presents with his wife who provides much of the history due to the patient's altered mental status and history of intracranial clots, recent surgery. History is obtained by the patient, his wife, and his neurosurgeon. Patient had stereotactic biopsy 10 days ago here.  Wife notes the patient was recovering generally well after the first 2 days of being sluggish, he was returning to baseline.  Now, however, over the past 2 days patient has had difficulty with interactivity, and new memory loss.  Changes were most profound yesterday with what is described as word salad, but with persistency today including memory loss, and answering appropriately to questions, without new weakness, without new fall, without fever, without chills or other alarming findings, he presents for evaluation.  According to patient's neurosurgeon the procedure was performed without complication, biopsy results are pending.    Past Medical History:  Diagnosis Date   Allergy    Anxiety    Arthritis    Asthma    severe, Dr. Linna Darner   Blood clots in brain    history of    Brain tumor ([place]) 12/31/1991   Depression    situational   Dyspnea    GERD (gastroesophageal reflux disease)    severe   Headache(784.0)    Hyperlipidemia    Hypothyroidism    Orchalgia    Pneumonia    hx of   Pre-diabetes    Prostate cancer ([place]) 07/01/2013   gleason 6   Thyroid disease    hypothyroidism    Patient Active Problem List   Diagnosis Date Noted   CVA (cerebral vascular accident) ([place]) 07/06/2021   Brain lesion 06/28/2021   Right frontal lobe lesion 06/28/2021   Brain mass 05/31/2021   History of CVA (cerebrovascular accident) 03/08/2020   Fatigue 02/10/2020   Memory loss  02/10/2020   Depression 02/10/2020   Tinea cruris 01/28/2020   Left shoulder tendinitis 10/13/2018   Gait abnormality 09/21/2018   Poor short-term memory 09/21/2018   Drooping eyelid disease, left 09/21/2018   History of prostate cancer 09/23/2016   Essential hypertension 09/23/2016   Diabetes mellitus without complication ([place]) 50/38/8828   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/2011   GERD (gastroesophageal reflux disease) 06/04/2011   Dyslipidemia 06/04/2011   Headache disorder 06/04/2011   Hypothyroidism 03/18/2008   Asthma 03/18/2008    Past Surgical History:  Procedure Laterality Date   APPLICATION OF CRANIAL NAVIGATION Right 06/28/2021   Procedure: APPLICATION OF CRANIAL NAVIGATION;  Surgeon: Vallarie Mare, MD;  Location: [place];  Service: Neurosurgery;  Laterality: Right;   brain shunt  12/31/1991   put in, removed, put in again   [place] Right 06/28/2021   Procedure: STEREOTACTIC FRONTAL BIOPSY OF CAUDATE LESION;  Surgeon: Vallarie Mare, MD;  Location: [place];  Service: Neurosurgery;  Laterality: Right;   LYMPHADENECTOMY Bilateral 10/22/2013   Procedure: LYMPHADENECTOMY;  Surgeon: Alexis Frock, MD;  Location: [facility];  Service: Urology;  Laterality: Bilateral;   removal brain tumor  12/31/1991   pineal tumor, benign   removed blood clot     X 2   ROBOT  ASSISTED LAPAROSCOPIC RADICAL PROSTATECTOMY N/A 10/22/2013   Procedure: ROBOTIC ASSISTED LAPAROSCOPIC RADICAL PROSTATECTOMY   PRE- PERITONEAL APPROACH;  Surgeon: Alexis Frock, MD;  Location: [facility];  Service: Urology;  Laterality: N/A;  3.5 HRS        Family History  Problem Relation Age of Onset   Cancer Brother        anal cancer   Asthma Maternal Uncle    Cancer Father        prostate  cancer   Testicular cancer Father    Coronary artery disease Mother 19       5 vessel CABG   Colon cancer Neg Hx    Stomach  cancer Neg Hx     Social History   Tobacco Use   Smoking status: Never   Smokeless tobacco: Never  Vaping Use   Vaping Use: Never used  Substance Use Topics   Alcohol use: Yes    Comment: Rarely   Drug use: No    Home Medications Prior to Admission medications   Medication Sig Start Date End Date Taking? Authorizing Provider  ADVAIR DISKUS 250-50 MCG/DOSE AEPB USE 1 PUFF EVERY 12 HOURS. Patient taking differently: Inhale 1 puff into the lungs every 12 (twelve) hours. 03/05/16  Yes Reynold Bowen, MD  albuterol (PROAIR HFA) 108 (90 Base) MCG/ACT inhaler USE @ PUFFS EVERY 6 HOURS AS NEEDED FOR WHEEZING Patient taking differently: Inhale 2 puffs into the lungs every 6 (six) hours as needed for shortness of breath or wheezing. 09/21/18  Yes Burns, Claudina Lick, MD  cetirizine (ZYRTEC) 10 MG tablet Take 10 mg by mouth in the morning.   Yes [provider]  dexamethasone (DECADRON) 2 MG tablet Take 2 tablets (4 mg total) by mouth every 6 (six) hours for 3 days, THEN 2 tablets (4 mg total) every 8 (eight) hours for 3 days, THEN 1 tablet (2 mg total) every 8 (eight) hours for 3 days, THEN 1 tablet (2 mg total) 2 (two) times daily for 3 days, THEN 0.5 tablets (1 mg total) 2 (two) times daily for 3 days, THEN 0.5 tablets (1 mg total) daily for 3 days. 06/29/21 07/17/21 Yes Vallarie Mare, MD  ezetimibe-simvastatin (VYTORIN) 10-40 MG tablet Take 1 tablet by mouth at bedtime. 05/21/21  Yes Burns, Claudina Lick, MD  hydrochlorothiazide (HYDRODIURIL) 25 MG tablet Take 1 tablet (25 mg total) by mouth daily. 06/08/21  Yes Burns, Claudina Lick, MD  levETIRAcetam (KEPPRA) 500 MG tablet Take 1 tablet (500 mg total) by mouth 2 (two) times daily for 7 days. 06/29/21 07/06/21 Yes Vallarie Mare, MD  lisinopril (ZESTRIL) 10 MG tablet Take 1 tablet (10 mg total) by mouth daily. 05/21/21  Yes Burns, Claudina Lick, MD  montelukast (SINGULAIR) 10 MG tablet Take 1 tablet (10 mg total) by mouth at bedtime. 05/21/21  Yes Burns, Claudina Lick,  MD  Multiple Vitamins-Minerals (CENTRUM SILVER 50+MEN PO) Take 1 tablet by mouth every evening.   Yes [provider]  pantoprazole (PROTONIX) 40 MG tablet Take 1 tablet (40 mg total) by mouth daily. Patient taking differently: Take 1 tablet by mouth daily before breakfast. 05/21/21  Yes Burns, Claudina Lick, MD  SYNTHROID 88 MCG tablet Take 88 mcg by mouth daily before breakfast.   Yes [provider]  vitamin B-12 (CYANOCOBALAMIN) 500 MCG tablet Take 500 mcg by mouth every evening.   Yes [provider]  docusate sodium (COLACE) 100 MG capsule Take 1 capsule (100 mg total) by mouth  2 (two) times daily. Patient not taking: No sig reported 06/29/21   Vallarie Mare, MD  levothyroxine (SYNTHROID) 88 MCG tablet TAKE 1 TABLET ONCE DAILY IN THE MORNING BEFORE BREAKFAST. Patient not taking: No sig reported 05/21/21   Binnie Rail, MD    Allergies    Chocolate, Dust mite extract, Erythromycin, Kola nut, and Other  Review of Systems   Review of Systems  Constitutional:        Per HPI, otherwise negative  HENT:         Per HPI, otherwise negative  Respiratory:         Per HPI, otherwise negative  Cardiovascular:        Per HPI, otherwise negative  Gastrointestinal:  Negative for vomiting.  Endocrine:       Negative aside from HPI  Genitourinary:        Neg aside from HPI   Musculoskeletal:        Per HPI, otherwise negative  Skin:  Positive for wound.  Neurological:  Negative for syncope.   Physical Exam Updated Vital Signs BP (!) 183/74   Pulse 64   Temp 98.4 F (36.9 C)   Resp 17   SpO2 98%   Physical Exam Vitals and nursing note reviewed.  Constitutional:      General: He is not in acute distress.    Appearance: He is well-developed.  HENT:     Head: Normocephalic.   Eyes:     Conjunctiva/sclera: Conjunctivae normal.  Cardiovascular:     Rate and Rhythm: Normal rate and regular rhythm.  Pulmonary:     Effort: Pulmonary effort is normal. No  respiratory distress.     Breath sounds: No stridor.  Abdominal:     General: There is no distension.  Skin:    General: Skin is warm and dry.  Neurological:     Mental Status: He is alert. He is disoriented and confused.     Cranial Nerves: Dysarthria present.     Motor: Atrophy present. No weakness, tremor or abnormal muscle tone.    ED Results / Procedures / Treatments   Labs (all labs ordered are listed, but only abnormal results are displayed) Labs Reviewed  CBC WITH DIFFERENTIAL/PLATELET - Abnormal; Notable for the following components:      Result Value   WBC 18.2 (*)    HCT 38.6 (*)    Neutro Abs 14.8 (*)    Monocytes Absolute 1.2 (*)    Abs Immature Granulocytes 0.94 (*)    All other components within normal limits  COMPREHENSIVE METABOLIC PANEL - Abnormal; Notable for the following components:   Sodium 129 (*)    Chloride 96 (*)    Glucose, Bld 161 (*)    BUN 30 (*)    Calcium 8.3 (*)    Total Protein 5.8 (*)    Albumin 3.1 (*)    ALT 84 (*)    All other components within normal limits  TSH - Abnormal; Notable for the following components:   TSH 0.057 (*)    All other components within normal limits  T4, FREE - Abnormal; Notable for the following components:   Free T4 1.43 (*)    All other components within normal limits  SARS CORONAVIRUS 2 (TAT 6-24 HRS)  T3, FREE  HEMOGLOBIN A1C  LIPID PANEL  HIV ANTIBODY (ROUTINE TESTING W REFLEX)  CBC  CREATININE, SERUM    EKG EKG Interpretation  Date/Time:  Friday July 06 2021 12:08:52  EDT Ventricular Rate:  66 PR Interval:  138 QRS Duration: 116 QT Interval:  408 QTC Calculation: 427 R Axis:   -9 Text Interpretation: Normal sinus rhythm Left ventricular hypertrophy with QRS widening ( R in aVL , Cornell product ) Cannot rule out Septal infarct , age undetermined Abnormal ECG Confirmed by [name] [phone]) on 07/06/2021 3:05:25 PM  Radiology DG Chest 2 View  Result Date: 07/06/2021 CLINICAL DATA:   Altered mental status, confusion for 24 hours EXAM: CHEST - 2 VIEW COMPARISON:  10/20/2013 FINDINGS: Enlargement of cardiac silhouette. Mediastinal contours and pulmonary vascularity normal. Lungs clear. No infiltrate, pleural effusion, or pneumothorax. Bones demineralized. IMPRESSION: No acute abnormalities. Enlargement of cardiac silhouette. Electronically Signed   By: Lavonia Dana M.D.   On: 07/06/2021 13:06   CT HEAD WO CONTRAST  Result Date: 07/06/2021 CLINICAL DATA:  Altered mental status. EXAM: CT HEAD WITHOUT CONTRAST TECHNIQUE: Contiguous axial images were obtained from the base of the skull through the vertex without intravenous contrast. COMPARISON:  06/29/2021. FINDINGS: Brain: New hypodensity and loss of gray-white differentiation in the anterior left temporal lobe, most likely an acute or subacute infarct. Interval resolution of the small volume of pneumocephalus seen on the prior. Redemonstrated hyperdense right caudate mass which appears similar in size in comparison to recent CT head from 06/29/2021. Linear hypodensity adjacent to the mass likely is a sequela recent biopsy. Mild regional mass effect is also similar. No convincing acute/interval hemorrhage. Heterogeneous hyperdensity in the basal ganglia likely reflects tumor when comparing to prior MRI. Similar small hypodensity in the right pons. Similar position of a right parietal approach ventriculostomy catheter which terminates in the atrium right lateral ventricle. Low position of additional midline occipital approach catheter which terminates between the frontal horns of the lateral ventricles. No evidence of hydrocephalus. Old ventriculostomy catheter tract is noted in the right frontal lobe. Vascular: Calcific atherosclerosis. No hyperdense vessel identified. Skull: Right frontal craniotomy. Right-sided burr hole. Suboccipital craniectomy. Sinuses/Orbits: Mild paranasal sinus mucosal thickening. No acute orbital findings. Other: No  mastoid effusions. IMPRESSION: 1. New hypodensity and loss of gray-white differentiation in the anterior left temporal lobe, most likely an acute or subacute infarct. Recommend MRI with contrast to confirm and to exclude other etiologies. 2. In comparison to recent CT head from June 29, 2021, no substantial change in the masslike densities in the right caudate and basal ganglia, further evaluated on prior MRI. 3. 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 Electronically Signed   By: Margaretha Sheffield MD   On: 07/06/2021 14:26   MR Brain W and Wo Contrast  Result Date: 07/06/2021 CLINICAL DATA:  Neuro deficit, acute stroke suspected. EXAM: MRI HEAD WITHOUT AND WITH CONTRAST TECHNIQUE: Multiplanar, multiecho pulse sequences of the brain and surrounding structures were obtained without and with intravenous contrast. CONTRAST:  22mL GADAVIST GADOBUTROL 1 MMOL/ML IV SOLN COMPARISON:  MRI May 24, 2021. FINDINGS: Brain: The hypoattenuation in the left temporal lobe seen on same day CT head correlates with nonenhancing restricted diffusion, most consistent with acute infarct.Associated edema and local sulcal effacement. Increased size of expansile T2/FLAIR hyperintensity within the right caudate with increasingly heterogeneous enhancement, now measuring 2.9 x 2.0 cm on series 9, image 14, previously 2.5 x 1.5 cm when remeasured. Enhancement abuts and is inseparable from the frontal horn of the right lateral ventricle, separate. Heterogeneous enhancement also extends into adjacent frontal white matter superiorly. Additionally, mildly increased size of the expansile, peripherally enhancing lesion in the posterior limb of the  right internal capsule, now measuring approximately 1.5 cm on series 9, image 12, previously 1.2 cm when remeasured. Both these areas demonstrate restricted diffusion, suggestive of hypercellularity. The previously seen enhancement in the right pons has resolved with small focus of T2  hyperintensity in this region. Similar chronic hemorrhagic lacunar infarcts in bilateral basal ganglia and chronic microhemorrhages in the thalami and elsewhere in the cerebral and cerebellum, as well as the brainstem. Right parietal approach ventriculostomy catheter is again noted a terminating in the atrium of the right lateral ventricle. Additionally, there is a midline ventriculostomy catheter which terminates in similar position. No hydrocephalus. Remote ventriculostomy catheter tract in the right frontal lobe, similar. No extra-axial fluid collection.  No midline shift. Vascular: Major arterial flow voids are maintained at the skull base. Small vertebrobasilar system, similar to prior. Skull and upper cervical spine: Normal marrow signal. Sinuses/Orbits: Mild sinus mucosal thickening.  Unremarkable orbits. IMPRESSION: 1. The hypoattenuation in the left temporal lobe seen on same day CT head correlates with nonenhancing restricted diffusion, compatible with acute infarct. Associated edema and local sulcal effacement. Additional small nonenhancing focus of restricted diffusion in the right paramidline pons likely represents an additional acute infarct 2. Increased size/bulk of expansile, heterogeneously enhancing masses in the right caudate and smaller lesion in the adjacent posterior limb of the internal capsule, as described above. 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 Atypical infection is considered less likely. Recommend correlation with pathology results of the recent biopsy. 3. The previously seen area of enhancement in the right pons has resolved with focal T2 hyperintensity in this region. Given the apparent resolution of enhancement, this area may have previously represented an enhancing subacute infarct, but this warrants continued attention on follow-up. Findings discussed with Dr. Vanita Panda at  7:18 p.m. via telephone. Electronically Signed   By: Margaretha Sheffield MD   On: 07/06/2021 19:38    Procedures Procedures   Medications Ordered in ED Medications   stroke: mapping our early stages of recovery book (has no administration in time range)  enoxaparin (LOVENOX) injection 40 mg (has no administration in time range)  labetalol (NORMODYNE) injection 5 mg (has no administration in time range)  acetaminophen (TYLENOL) tablet 650 mg (has no administration in time range)  ondansetron (ZOFRAN) injection 4 mg (has no administration in time range)  melatonin tablet 3 mg (has no administration in time range)  polyethylene glycol (MIRALAX / GLYCOLAX) packet 17 g (has no administration in time range)  gadobutrol (GADAVIST) 1 MMOL/ML injection 8 mL (8 mLs Intravenous Contrast Given 07/06/21 1848)    ED Course  I have reviewed the triage vital signs and the nursing notes.  Pertinent labs & imaging results that were available during my care of the patient were reviewed by me and considered in my medical decision making (see chart for details).  On repeat exam patient is in no distress.  Update: I discussed patient's case with his neurosurgeon.  MRI ordered.  Update: I discussed the patient's case with our neurology colleagues.  8:23 PM I have again discussed the patient's presentation with our neurology colleagues he has been seen and evaluated by her consultants as well. Patient's CT, MRI, concerning for acute infarct, in the context of ongoing evaluation for possible glioblastoma. Patient's neuro exam remains unchanged, but given concerns as above he will be admitted for further monitoring, management. MDM Rules/Calculators/A&P MDM Number of Diagnoses or Management Options Acute ischemic  stroke [facility]): new, needed workup   Amount and/or Complexity of Data Reviewed Clinical lab tests: ordered and reviewed Tests in the radiology section of CPT: ordered and reviewed Tests in the  medicine section of CPT: reviewed and ordered Decide to obtain previous medical records or to obtain history from someone other than the patient: yes Obtain history from someone other than the patient: yes Review and summarize past medical records: yes Discuss the patient with other providers: yes Independent visualization of images, tracings, or specimens: yes  Risk of Complications, Morbidity, and/or Mortality Presenting problems: high Diagnostic procedures: high Management options: high  Critical Care Total time providing critical care: < 30 minutes  Patient Progress Patient progress: stable   Final Clinical Impression(s) / ED Diagnoses Final diagnoses:  Acute ischemic stroke [facility])     [name], MD 07/06/21 2024

## 2021-07-06 NOTE — H&P (Signed)
History and Physical  Adam Sullivan MWU:[id number] DOB: [birth year] DOA: 07/06/2021  Referring physician: Dr. Vanita Panda, [place]. PCP: Binnie Rail, MD  Outpatient Specialists: Neurology, neurosurgery. Patient coming from: Home.   Chief Complaint: Confusion, gait abnormality.  HPI: Adam Sullivan is a 65 y.o. 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 Marcello Moores of neurosurgery, hypertension, hyperlipidemia, GERD, hypothyroidism who presented to [facility] ED from home due to confusion, gait abnormality and word salad.  Last known well Wednesday, 07/04/2021.  On 07/05/2021 patient had word salad with onset around 1600.  Associated with a wobbly gait.  At baseline he walks with a walking stick.  This morning he appeared more confused, was weaker with increased gait difficulty.  He was brought into the ED for further evaluation.  Work-up in the ED revealed anterior left temporal lobe acute or subacute infarct.  Seen by neurology and neurosurgery who recommended hospitalist admission.  ED Course:  Temperature 98.4.  BP 183/74, pulse 653, respiratory rate 14, O2 saturation 97%.  Review of Systems: Review of systems as noted in the HPI. All other systems reviewed and are negative.   Past Medical History:  Diagnosis Date   Allergy    Anxiety    Arthritis    Asthma    severe, Dr. Linna Darner   Blood clots in brain    history of    Brain tumor ([place]) 12/31/1991   Depression    situational   Dyspnea    GERD (gastroesophageal reflux disease)    severe   Headache(784.0)    Hyperlipidemia    Hypothyroidism    Orchalgia    Pneumonia    hx of   Pre-diabetes    Prostate cancer ([place]) 07/01/2013   gleason 6   Thyroid disease    hypothyroidism   Past Surgical History:  Procedure Laterality Date   APPLICATION OF CRANIAL NAVIGATION  Right 06/28/2021   Procedure: APPLICATION OF CRANIAL NAVIGATION;  Surgeon: Vallarie Mare, MD;  Location: [place];  Service: Neurosurgery;  Laterality: Right;   brain shunt  12/31/1991   put in, removed, put in again   [place] Right 06/28/2021   Procedure: STEREOTACTIC FRONTAL BIOPSY OF CAUDATE LESION;  Surgeon: Vallarie Mare, MD;  Location: [place];  Service: Neurosurgery;  Laterality: Right;   LYMPHADENECTOMY Bilateral 10/22/2013   Procedure: LYMPHADENECTOMY;  Surgeon: Alexis Frock, MD;  Location[facility];  Service: Urology;  Laterality: Bilateral;   removal brain tumor  12/31/1991   pineal tumor, benign   removed blood clot     X 2   ROBOT ASSISTED LAPAROSCOPIC RADICAL PROSTATECTOMY N/A 10/22/2013   Procedure: ROBOTIC ASSISTED LAPAROSCOPIC RADICAL PROSTATECTOMY   PRE- PERITONEAL APPROACH;  Surgeon: Alexis Frock, MD;  Location[facility];  Service: Urology;  Laterality: N/A;  3.5 HRS     Social History:  reports that he has never smoked. He has never used smokeless tobacco. He reports current alcohol use. He reports that he does not use drugs.   Allergies  Allergen Reactions   Chocolate Other (See Comments)    Unsure of reaction   Dust Mite Extract Other (See Comments)    "Asthma"   Erythromycin Other (See Comments)    Reaction not recalled- occurred in childhood   [place]  Nut Other (See Comments)    Cannot drink Pepsi or Coca-Cola, unsure of reaction   Other Other (See Comments)    Multigrain bread and tested allergic to many things- caused blistering on his back when allergy tested    Family History  Problem Relation Age of Onset   Cancer Brother        anal cancer   Asthma Maternal Uncle    Cancer Father        prostate  cancer   Testicular cancer Father    Coronary artery disease Mother 32       5 vessel CABG   Colon cancer Neg Hx    Stomach cancer Neg Hx       Prior to Admission medications    Medication Sig Start Date End Date Taking? Authorizing Provider  ADVAIR DISKUS 250-50 MCG/DOSE AEPB USE 1 PUFF EVERY 12 HOURS. Patient taking differently: Inhale 1 puff into the lungs every 12 (twelve) hours. 03/05/16  Yes Reynold Bowen, MD  albuterol (PROAIR HFA) 108 (90 Base) MCG/ACT inhaler USE @ PUFFS EVERY 6 HOURS AS NEEDED FOR WHEEZING Patient taking differently: Inhale 2 puffs into the lungs every 6 (six) hours as needed for shortness of breath or wheezing. 09/21/18  Yes Burns, Claudina Lick, MD  cetirizine (ZYRTEC) 10 MG tablet Take 10 mg by mouth in the morning.   Yes [provider]  dexamethasone (DECADRON) 2 MG tablet Take 2 tablets (4 mg total) by mouth every 6 (six) hours for 3 days, THEN 2 tablets (4 mg total) every 8 (eight) hours for 3 days, THEN 1 tablet (2 mg total) every 8 (eight) hours for 3 days, THEN 1 tablet (2 mg total) 2 (two) times daily for 3 days, THEN 0.5 tablets (1 mg total) 2 (two) times daily for 3 days, THEN 0.5 tablets (1 mg total) daily for 3 days. 06/29/21 07/17/21 Yes Vallarie Mare, MD  ezetimibe-simvastatin (VYTORIN) 10-40 MG tablet Take 1 tablet by mouth at bedtime. 05/21/21  Yes Burns, Claudina Lick, MD  hydrochlorothiazide (HYDRODIURIL) 25 MG tablet Take 1 tablet (25 mg total) by mouth daily. 06/08/21  Yes Burns, Claudina Lick, MD  levETIRAcetam (KEPPRA) 500 MG tablet Take 1 tablet (500 mg total) by mouth 2 (two) times daily for 7 days. 06/29/21 07/06/21 Yes Vallarie Mare, MD  lisinopril (ZESTRIL) 10 MG tablet Take 1 tablet (10 mg total) by mouth daily. 05/21/21  Yes Burns, Claudina Lick, MD  montelukast (SINGULAIR) 10 MG tablet Take 1 tablet (10 mg total) by mouth at bedtime. 05/21/21  Yes Burns, Claudina Lick, MD  Multiple Vitamins-Minerals (CENTRUM SILVER 50+MEN PO) Take 1 tablet by mouth every evening.   Yes [provider]  pantoprazole (PROTONIX) 40 MG tablet Take 1 tablet (40 mg total) by mouth daily. Patient taking differently: Take 1 tablet by mouth daily before  breakfast. 05/21/21  Yes Burns, Claudina Lick, MD  SYNTHROID 88 MCG tablet Take 88 mcg by mouth daily before breakfast.   Yes [provider]  vitamin B-12 (CYANOCOBALAMIN) 500 MCG tablet Take 500 mcg by mouth every evening.   Yes [provider]  docusate sodium (COLACE) 100 MG capsule Take 1 capsule (100 mg total) by mouth 2 (two) times daily. Patient not taking: No sig reported 06/29/21   Vallarie Mare, MD  levothyroxine (SYNTHROID) 88 MCG tablet TAKE 1 TABLET ONCE DAILY IN THE MORNING BEFORE BREAKFAST. Patient not taking: No sig reported 05/21/21   Binnie Rail, MD  Physical Exam: BP (!) 183/74   Pulse 64   Temp 98.4 F (36.9 C)   Resp 17   SpO2 98%   General: 65 y.o. year-old male well developed well nourished in no acute distress.  Alert and interactive. Cardiovascular: Regular rate and rhythm with no rubs or gallops.  No thyromegaly or JVD noted.  No lower extremity edema. 2/4 pulses in all 4 extremities. Respiratory: Clear to auscultation with no wheezes or rales. Good inspiratory effort. Abdomen: Soft nontender nondistended with normal bowel sounds x4 quadrants. Muskuloskeletal: No cyanosis, clubbing or edema noted bilaterally Neuro: CN II-XII intact, strength, sensation, reflexes Skin: No ulcerative lesions noted or rashes Psychiatry: Judgement and insight appear appropriate. Mood is appropriate for condition and setting          Labs on Admission:  Basic Metabolic Panel: Recent Labs  Lab 07/06/21 1221  NA 129*  K 3.7  CL 96*  CO2 25  GLUCOSE 161*  BUN 30*  CREATININE 0.77  CALCIUM 8.3*   Liver Function Tests: Recent Labs  Lab 07/06/21 1221  AST 28  ALT 84*  ALKPHOS 46  BILITOT 0.7  PROT 5.8*  ALBUMIN 3.1*   No results for input(s): LIPASE, AMYLASE in the last 168 hours. No results for input(s): AMMONIA in the last 168 hours. CBC: Recent Labs  Lab 07/06/21 1221  WBC 18.2*  NEUTROABS 14.8*  HGB 13.1  HCT 38.6*  MCV 86.4  PLT  306   Cardiac Enzymes: No results for input(s): CKTOTAL, CKMB, CKMBINDEX, TROPONINI in the last 168 hours.  BNP (last 3 results) No results for input(s): BNP in the last 8760 hours.  ProBNP (last 3 results) No results for input(s): PROBNP in the last 8760 hours.  CBG: No results for input(s): GLUCAP in the last 168 hours.  Radiological Exams on Admission: DG Chest 2 View  Result Date: 07/06/2021 CLINICAL DATA:  Altered mental status, confusion for 24 hours EXAM: CHEST - 2 VIEW COMPARISON:  10/20/2013 FINDINGS: Enlargement of cardiac silhouette. Mediastinal contours and pulmonary vascularity normal. Lungs clear. No infiltrate, pleural effusion, or pneumothorax. Bones demineralized. IMPRESSION: No acute abnormalities. Enlargement of cardiac silhouette. Electronically Signed   By: Lavonia Dana M.D.   On: 07/06/2021 13:06   CT HEAD WO CONTRAST  Result Date: 07/06/2021 CLINICAL DATA:  Altered mental status. EXAM: CT HEAD WITHOUT CONTRAST TECHNIQUE: Contiguous axial images were obtained from the base of the skull through the vertex without intravenous contrast. COMPARISON:  06/29/2021. FINDINGS: Brain: New hypodensity and loss of gray-white differentiation in the anterior left temporal lobe, most likely an acute or subacute infarct. Interval resolution of the small volume of pneumocephalus seen on the prior. Redemonstrated hyperdense right caudate mass which appears similar in size in comparison to recent CT head from 06/29/2021. Linear hypodensity adjacent to the mass likely is a sequela recent biopsy. Mild regional mass effect is also similar. No convincing acute/interval hemorrhage. Heterogeneous hyperdensity in the basal ganglia likely reflects tumor when comparing to prior MRI. Similar small hypodensity in the right pons. Similar position of a right parietal approach ventriculostomy catheter which terminates in the atrium right lateral ventricle. Low position of additional midline occipital approach  catheter which terminates between the frontal horns of the lateral ventricles. No evidence of hydrocephalus. Old ventriculostomy catheter tract is noted in the right frontal lobe. Vascular: Calcific atherosclerosis. No hyperdense vessel identified. Skull: Right frontal craniotomy. Right-sided burr hole. Suboccipital craniectomy. Sinuses/Orbits: Mild paranasal sinus mucosal thickening. No acute orbital findings. Other:  No mastoid effusions. IMPRESSION: 1. New hypodensity and loss of gray-white differentiation in the anterior left temporal lobe, most likely an acute or subacute infarct. Recommend MRI with contrast to confirm and to exclude other etiologies. 2. In comparison to recent CT head from June 29, 2021, no substantial change in the masslike densities in the right caudate and basal ganglia, further evaluated on prior MRI. 3. 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 Electronically Signed   By: Margaretha Sheffield MD   On: 07/06/2021 14:26   MR Brain W and Wo Contrast  Result Date: 07/06/2021 CLINICAL DATA:  Neuro deficit, acute stroke suspected. EXAM: MRI HEAD WITHOUT AND WITH CONTRAST TECHNIQUE: Multiplanar, multiecho pulse sequences of the brain and surrounding structures were obtained without and with intravenous contrast. CONTRAST:  20mL GADAVIST GADOBUTROL 1 MMOL/ML IV SOLN COMPARISON:  MRI May 24, 2021. FINDINGS: Brain: The hypoattenuation in the left temporal lobe seen on same day CT head correlates with nonenhancing restricted diffusion, most consistent with acute infarct.Associated edema and local sulcal effacement. Increased size of expansile T2/FLAIR hyperintensity within the right caudate with increasingly heterogeneous enhancement, now measuring 2.9 x 2.0 cm on series 9, image 14, previously 2.5 x 1.5 cm when remeasured. Enhancement abuts and is inseparable from the frontal horn of the right lateral ventricle, separate. Heterogeneous enhancement also extends into adjacent frontal  white matter superiorly. Additionally, mildly increased size of the expansile, peripherally enhancing lesion in the posterior limb of the right internal capsule, now measuring approximately 1.5 cm on series 9, image 12, previously 1.2 cm when remeasured. Both these areas demonstrate restricted diffusion, suggestive of hypercellularity. The previously seen enhancement in the right pons has resolved with small focus of T2 hyperintensity in this region. Similar chronic hemorrhagic lacunar infarcts in bilateral basal ganglia and chronic microhemorrhages in the thalami and elsewhere in the cerebral and cerebellum, as well as the brainstem. Right parietal approach ventriculostomy catheter is again noted a terminating in the atrium of the right lateral ventricle. Additionally, there is a midline ventriculostomy catheter which terminates in similar position. No hydrocephalus. Remote ventriculostomy catheter tract in the right frontal lobe, similar. No extra-axial fluid collection.  No midline shift. Vascular: Major arterial flow voids are maintained at the skull base. Small vertebrobasilar system, similar to prior. Skull and upper cervical spine: Normal marrow signal. Sinuses/Orbits: Mild sinus mucosal thickening.  Unremarkable orbits. IMPRESSION: 1. The hypoattenuation in the left temporal lobe seen on same day CT head correlates with nonenhancing restricted diffusion, compatible with acute infarct. Associated edema and local sulcal effacement. Additional small nonenhancing focus of restricted diffusion in the right paramidline pons likely represents an additional acute infarct 2. Increased size/bulk of expansile, heterogeneously enhancing masses in the right caudate and smaller lesion in the adjacent posterior limb of the internal capsule, as described above. 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 Atypical infection  is considered less likely. Recommend correlation with pathology results of the recent biopsy. 3. The previously seen area of enhancement in the right pons has resolved with focal T2 hyperintensity in this region. Given the apparent resolution of enhancement, this area may have previously represented an enhancing subacute infarct, but this warrants continued attention on follow-up. Findings discussed with Dr. Vanita Panda at 7:18 p.m. via telephone. Electronically Signed   By: Margaretha Sheffield MD   On: 07/06/2021 19:38    EKG: I independently viewed the EKG done and my findings are as followed:  Sinus rhythm rate of 66.  Nonspecific ST-T changes.  QTc 427.  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 Admitted for stroke work-up Seen by neurology and neurosurgery Obtain Lipid panel, A1c Obtain 2D echo with bubble study Frequent neurochecks Permissive hypertension treat SBP greater than 220 or DBP greater than 120 PT/OT/speech evaluation Fall/aspiration precautions Telemetry monitoring Neurology recommended aspirin daily.  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 Repeat BMP in the morning  Leukocytosis, reactive in the setting of steroids use Nonseptic appearing, afebrile Repeat CBC in the morning  Hyperlipidemia Resume home regimen  GERD Resume home regimen  Steroid-induced hyperglycemia Obtain A1c Insulin sliding scale every 4 hours while NPO.  Ambulatory dysfunction PT OT to assess Fall precautions    DVT  prophylaxis: Subcu Lovenox daily  Code Status: Full code  Family Communication: Wife at bedside  Disposition Plan: Admit to telemetry medical  Consults called: Neurology, neurosurgery  Admission status: Observation status   Status is: Observation   Dispo: The patient is from: Home.               Anticipated d/c is to: Home.              Patient currently not stable for discharge due to ongoing stroke work-up.     Difficult to place patient, not applicable.       Kayleen Memos MD [facility] Pager [phone]  If 7PM-7AM, please contact night-coverage [email or web address] Password [facility]  07/06/2021, 8:14 PM

## 2021-07-06 NOTE — ED Notes (Signed)
The pt failed his swallow test

## 2021-07-06 NOTE — Consult Note (Signed)
CC: confusion  HPI:     Patient is a 65 y.o. male with hx of pineocytoma s/p resection, XRT, VPS who is s/p stereotactic biopsy of brain lesion.  He was doing well postoperatively, but yesterday his wife noticed he was slightly more confused and more tired.  She noted episodes of word salad, but he was having these even before the surgery so she was not too worried.  She brought him to the ER for evaluation.    Patient Active Problem List   Diagnosis Date Noted   Brain lesion 06/28/2021   Right frontal lobe lesion 06/28/2021   Brain mass 05/31/2021   History of CVA (cerebrovascular accident) 03/08/2020   Fatigue 02/10/2020   Memory loss 02/10/2020   Depression 02/10/2020   Tinea cruris 01/28/2020   Left shoulder tendinitis 10/13/2018   Gait abnormality 09/21/2018   Poor short-term memory 09/21/2018   Drooping eyelid disease, left 09/21/2018   History of prostate cancer 09/23/2016   Essential hypertension 09/23/2016   Diabetes mellitus without complication ([place]) 19/37/9024   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/2011   GERD (gastroesophageal reflux disease) 06/04/2011   Dyslipidemia 06/04/2011   Headache disorder 06/04/2011   Hypothyroidism 03/18/2008   Asthma 03/18/2008   Past Medical History:  Diagnosis Date   Allergy    Anxiety    Arthritis    Asthma    severe, Dr. Linna Darner   Blood clots in brain    history of    Brain tumor ([place]) 12/31/1991   Depression    situational   Dyspnea    GERD (gastroesophageal reflux disease)    severe   Headache(784.0)    Hyperlipidemia    Hypothyroidism    Orchalgia    Pneumonia    hx of   Pre-diabetes    Prostate cancer ([place]) 07/01/2013   gleason 6   Thyroid disease    hypothyroidism    Past Surgical History:  Procedure Laterality Date   APPLICATION OF CRANIAL NAVIGATION Right 06/28/2021   Procedure: APPLICATION OF CRANIAL NAVIGATION;  Surgeon: Vallarie Mare, MD;  Location: [place];  Service:  Neurosurgery;  Laterality: Right;   brain shunt  12/31/1991   put in, removed, put in again   [place] Right 06/28/2021   Procedure: STEREOTACTIC FRONTAL BIOPSY OF CAUDATE LESION;  Surgeon: Vallarie Mare, MD;  Location: [place];  Service: Neurosurgery;  Laterality: Right;   LYMPHADENECTOMY Bilateral 10/22/2013   Procedure: LYMPHADENECTOMY;  Surgeon: Alexis Frock, MD;  [facility];  Service: Urology;  Laterality: Bilateral;   removal brain tumor  12/31/1991   pineal tumor, benign   removed blood clot     X 2   ROBOT ASSISTED LAPAROSCOPIC RADICAL PROSTATECTOMY N/A 10/22/2013   Procedure: ROBOTIC ASSISTED LAPAROSCOPIC RADICAL PROSTATECTOMY   PRE- PERITONEAL APPROACH;  Surgeon: Alexis Frock, MD;  [facility];  Service: Urology;  Laterality: N/A;  3.5 HRS     (Not in a hospital admission)  Allergies  Allergen Reactions   Chocolate Other (See Comments)    Unsure of reaction   Dust Mite Extract Other (See Comments)    "Asthma"   Erythromycin Other (See Comments)    Reaction not recalled- occurred in childhood   [place] Other (See Comments)    Cannot drink Pepsi or Coca-Cola, unsure of reaction   Other Other (See Comments)    Multigrain bread and  tested allergic to many things- caused blistering on his back when allergy tested    Social History   Tobacco Use   Smoking status: Never   Smokeless tobacco: Never  Substance Use Topics   Alcohol use: Yes    Comment: Rarely    Family History  Problem Relation Age of Onset   Cancer Brother        anal cancer   Asthma Maternal Uncle    Cancer Father        prostate  cancer   Testicular cancer Father    Coronary artery disease Mother 13       5 vessel CABG   Colon cancer Neg Hx    Stomach cancer Neg Hx      Review of Systems Pertinent items noted in HPI and remainder of comprehensive ROS otherwise negative.  Objective:   Patient Vitals for  the past 8 hrs:  BP Temp Temp src Pulse Resp SpO2  07/06/21 1207 (!) 152/73 97.7 F (36.5 C) Oral 65 14 99 %   No intake/output data recorded. No intake/output data recorded.    Awake, alert Oriented to person, hospital, month Mild confusion noted, though not appreciably different than preop No pronator drift Biopsy site appears to be healing well  Data ReviewCBC:  Lab Results  Component Value Date   WBC 18.2 (H) 07/06/2021   RBC 4.47 07/06/2021   BMP:  Lab Results  Component Value Date   GLUCOSE 161 (H) 07/06/2021   CO2 25 07/06/2021   BUN 30 (H) 07/06/2021   CREATININE 0.77 07/06/2021   CALCIUM 8.3 (L) 07/06/2021   Radiology review:  CT head and MRI performed in ER reviewed-- new hypodensity in anterior left temporal lobe.  Grossly, other brain lesions appear unchanged.  No abnormality seen at the biopsy site.  Assessment:  65 y.o. male with hx of pineocytoma s/p resection, XRT, VPS who is s/p stereotactic biopsy of brain lesion 1 week ago who has a early subacute left anterior temporal stroke.  Plan:  - stroke workup per neurology; will need head and neck vascular imaging and echocardiogram - ok for anti-platelet agents and anticoagulation if necessary  I discussed the imaging findings with the patient's wife at the bedside.

## 2021-07-06 NOTE — ED Triage Notes (Signed)
Patient here for evaluation of altered mental status and word salad. Patient alert and in no apparent distress at this time.

## 2021-07-06 NOTE — ED Notes (Signed)
Pt is going to [facility]

## 2021-07-06 NOTE — ED Provider Notes (Addendum)
Emergency Medicine Provider Triage Evaluation Note  Adam Sullivan , a 65 y.o. male  was evaluated in triage.  Pt complains of some confusion over the past 24 hours.  Wife at bedside and is able to provide more detail patient responsive and able answer questions about his condition but seems able to examine details of episodic confusion.  Otherwise patient is "out of it ".  Denies any pain currently and states that he feels well.  Seems pleasantly confused the episodes that wife is describing.  Patient had brain surgery with Dr. Marcello Sullivan of neurosurgery and this operation was done during his last hospital visit 6/30.  Is discharged 7/1 seems to have had no issues until over the past 24 hours.  He has been having some episodes of word salad per his wife.  Has told his wife that he was eating chips and went into that he was eating cereal.  Has not complained of any numbness or weakness no slurred speech.  There are no symptoms of the past 4 to 5 hours.  Wife at bedside states that their doctor Dr. Marcello Sullivan who offered steroids but decided come to the ER because of concern.  Denies any headache  Review of Systems  Positive: Episodic confusion Negative: Numbness or weakness  Physical Exam  BP (!) 152/73   Pulse 65   Temp 97.7 F (36.5 C) (Oral)   Resp 14   SpO2 99%  Gen:   Awake, no distress pleasant, able answer questions follow commands Resp:  Normal effort  MSK:   Moves extremities without difficulty  Other:   Alert and oriented to self, states his birth year [birth year]  instead of current year, states he is in a building but unsure what building he is in.   Speech is fluent, clear without dysarthria or dysphasia.   Strength 5/5 in upper/lower extremities   Sensation intact in upper/lower extremities   Gait at baseline per wife Negative Romberg. No pronator drift.  Normal finger-to-nose and feet tapping.  CN I not tested  CN II grossly intact visual fields bilaterally. Did not visualize  posterior eye.  CN III, IV, VI PERRLA and EOMs intact bilaterally  CN V Intact sensation to sharp and light touch to the face  CN VII facial movements symmetric  CN VIII not tested  CN IX, X no uvula deviation, symmetric rise of soft palate  CN XI 5/5 SCM and trapezius strength bilaterally  CN XII Midline tongue protrusion, symmetric L/R movements    Medical Decision Making  Medically screening exam initiated at 12:21 PM.  Appropriate orders placed.  Adam Sullivan was informed that the remainder of the evaluation will be completed by another provider, this initial triage assessment does not replace that evaluation, and the importance of remaining in the ED until their evaluation is complete.  Patient is neurologically intact 65 year old gentleman presenting today after neurosurgery 6/30 no headaches but has episodic confusion.  Will prioritize patient movement to major care.  He is neurologically intact and [name] negative has had symptoms for slight less than 24 hours obtain CT scanning head, lab work and make sure the patient is brought back to urgent care urgently  Has a shunt -  Ddx includes worsening intracranial mass lesions, shunt obstruction--causing hydrocephalus, intracranial hemorrhage less likely.   Adam Sullivan, [place] 07/06/21 1227   2:26 PM discussed with nursing staff again that pt should be prioritized to move back.    Adam Sullivan, [place] 07/06/21 1427  Adam Muskrat, MD 07/06/21 1736

## 2021-07-06 NOTE — Progress Notes (Signed)
EEG complete - results pending 

## 2021-07-06 NOTE — ED Notes (Signed)
Pt getting a eeg

## 2021-07-06 NOTE — ED Notes (Signed)
Report given to brenda

## 2021-07-06 NOTE — ED Notes (Signed)
Pt did not pass his swallow screen  Hassan Rowan rn on 3w was notified

## 2021-07-06 NOTE — Consult Note (Signed)
Neurology Consultation  Reason for Consult: confusion, word salad, and multiple brain lesions. Referring Physician: Kirby Funk, MD.   CC: word salad, confusion, wobbly gait.   History is obtained from: Wife, husband is unable to give a clear history due to mental status.   HPI: Adam Sullivan is a 65 y.o. 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 Marcello Moores, [place]. The pathology report is still pending. Patient under the care of Dr. Mickeal Skinner, oncology, with last visit 06/05/21 prior to biopsy.   Patient's wife reports that on Wednesday, patient was in his normal mental state, and actually traveled to [place] to have lunch with his sister. Wife states he was helping her with GPS directions. Yesterday, he had 3 episodes of word salad at 1600 hours, 2000 hours and 2300 hours. Word salad described as replacing wrong words for correct words. He was found in the kitchen with the pantry doors open "looking for eggs". Wife states he is usually not shy when he feels bad, but for 2 days, he has always replied "I'm fine". Also, with wobbly gait. He normally walks with his walking stick, but was needing to lean on his wife as well. Per wife, NSU was considering steroids if not doing better.   This a.m., wife was having difficulty getting patient up. States he was sleepy which is not normal for him. He kept sitting up and then lying back down stating he was getting up. Finally, wife got him up and took him to the bathroom, finding him having to use her and walking stick to assist with ambulation. She brought him in for sleepiness, confusion, word salad, and increased gait difficulty.   Workup in the ED with [facility] with noted new hypodensity and loss  of gray-white differentiation in anterior left temporal lobe with suspicion for infarct. Patient having MRI brain with and without consult. WBCC 18.2, Na 129.   Neurology consulted for new infarct vs. brain lesion.   ROS: No HA, n/v, abdominal pain. Rest unknown except as per HPI due to patient's mental status.   Past Medical History:  Diagnosis Date   Allergy    Anxiety    Arthritis    Asthma    severe, Dr. Linna Darner   Blood clots in brain    history of    Brain tumor ([place]) 12/31/1991   Depression    situational   Dyspnea    GERD (gastroesophageal reflux disease)    severe   Headache(784.0)    Hyperlipidemia    Hypothyroidism    Orchalgia    Pneumonia    hx of   Pre-diabetes    Prostate cancer ([place]) 07/01/2013   gleason 6   Thyroid disease    hypothyroidism   Family History  Problem Relation Age of Onset   Cancer Brother        anal cancer   Asthma Maternal Uncle    Cancer Father        prostate  cancer   Testicular cancer Father    Coronary artery disease Mother 67       5 vessel CABG   Colon cancer Neg Hx    Stomach cancer Neg Hx    Social History:   reports that he has never smoked. He has never used  smokeless tobacco. He reports current alcohol use. He reports that he does not use drugs.  Medications No current facility-administered medications for this encounter.  Current Outpatient Medications:    ADVAIR DISKUS 250-50 MCG/DOSE AEPB, USE 1 PUFF EVERY 12 HOURS. (Patient taking differently: Inhale 1 puff into the lungs every 12 (twelve) hours.), Disp: 60 each, Rfl: 0   albuterol (PROAIR HFA) 108 (90 Base) MCG/ACT inhaler, USE @ PUFFS EVERY 6 HOURS AS NEEDED FOR WHEEZING (Patient taking differently: Inhale 2 puffs into the lungs every 6 (six) hours as needed for shortness of breath or wheezing.), Disp: 8.5 g, Rfl: 5   cetirizine (ZYRTEC) 10 MG tablet, Take 10 mg by mouth in the morning., Disp: , Rfl:    dexamethasone (DECADRON) 2 MG tablet, Take 2 tablets (4 mg  total) by mouth every 6 (six) hours for 3 days, THEN 2 tablets (4 mg total) every 8 (eight) hours for 3 days, THEN 1 tablet (2 mg total) every 8 (eight) hours for 3 days, THEN 1 tablet (2 mg total) 2 (two) times daily for 3 days, THEN 0.5 tablets (1 mg total) 2 (two) times daily for 3 days, THEN 0.5 tablets (1 mg total) daily for 3 days., Disp: 61.5 tablet, Rfl: 0   ezetimibe-simvastatin (VYTORIN) 10-40 MG tablet, Take 1 tablet by mouth at bedtime., Disp: 90 tablet, Rfl: 1   hydrochlorothiazide (HYDRODIURIL) 25 MG tablet, Take 1 tablet (25 mg total) by mouth daily., Disp: 90 tablet, Rfl: 1   levETIRAcetam (KEPPRA) 500 MG tablet, Take 1 tablet (500 mg total) by mouth 2 (two) times daily for 7 days., Disp: 14 tablet, Rfl: 0   lisinopril (ZESTRIL) 10 MG tablet, Take 1 tablet (10 mg total) by mouth daily., Disp: 90 tablet, Rfl: 1   montelukast (SINGULAIR) 10 MG tablet, Take 1 tablet (10 mg total) by mouth at bedtime., Disp: 90 tablet, Rfl: 1   Multiple Vitamins-Minerals (CENTRUM SILVER 50+MEN PO), Take 1 tablet by mouth every evening., Disp: , Rfl:    pantoprazole (PROTONIX) 40 MG tablet, Take 1 tablet (40 mg total) by mouth daily. (Patient taking differently: Take 1 tablet by mouth daily before breakfast.), Disp: 90 tablet, Rfl: 1   SYNTHROID 88 MCG tablet, Take 88 mcg by mouth daily before breakfast., Disp: , Rfl:    vitamin B-12 (CYANOCOBALAMIN) 500 MCG tablet, Take 500 mcg by mouth every evening., Disp: , Rfl:    docusate sodium (COLACE) 100 MG capsule, Take 1 capsule (100 mg total) by mouth 2 (two) times daily. (Patient not taking: No sig reported), Disp: 60 capsule, Rfl: 2   levothyroxine (SYNTHROID) 88 MCG tablet, TAKE 1 TABLET ONCE DAILY IN THE MORNING BEFORE BREAKFAST. (Patient not taking: No sig reported), Disp: 90 tablet, Rfl: 1   Exam: Current vital signs: BP (!) 152/73   Pulse 65   Temp 97.7 F (36.5 C) (Oral)   Resp 14   SpO2 99%  Vital signs in last 24 hours: Temp:  [97.7 F (36.5  C)] 97.7 F (36.5 C) (07/08 1207) Pulse Rate:  [65] 65 (07/08 1207) Resp:  [14] 14 (07/08 1207) BP: (152)/(73) 152/73 (07/08 1207) SpO2:  [99 %] 99 % (07/08 1207)  PE: GENERAL: Chronically ill and slightly frail appearing. Awake, alert in NAD.  HEENT: Right frontal suture at stereotactic site. Right posterior skull indention. Shunt can be palpated at right parietal area.  LUNGS - Normal respiratory effort.  CV - LE edema.  ABDOMEN - Soft, nontender. Ext: warm, well perfused.  Skin: pink, dry rash to left forearm.   NEURO:  Mental Status: Slightly lethargic, but will open his eyes for most of exam. Follows some commands and not others. He can tell NP his name, but not age. Can not tell NP place, month, day, date, or year. Later, he goes back to saying July, but NP believes he is talking about his birthday.  Speech/Language: speech is without dysarthria or aphasia. Word salad noted.  He is able to name pen, but unable to tell NP it's purpose. When asked about watch, he says it is a Chiropractor.  Cranial Nerves:  II: PERRL 4 mm/brisk. visual fields full.  III, IV, VI: OS ptosis chronic. Eyes dysconjugate. .  V: sensation is intact and symmetrical to face.  VII: Smile is symmetrical. Able to puff cheeks and raise eyebrows.  VIII:hearing intact to voice. IX, X: palate elevation is symmetric. Phonation normal.  XI: normal sternocleidomastoid and trapezius muscle strength. HDQ:QIWLNL is symmetrical without fasciculations.   Motor:  All muscle groups are 5/5 with exception of LUE triceps 4+/5, BLE dorsiflexion and plantar flexion with effort against gravity with ? understanding of performing task.  Tone is normal. Bulk is decreased. Sensation- Intact to light touch bilaterally in all four extremities. Extinction absent to light touch to DSS.  Coordination: FTN intact bilaterally. Does not comprehend HKS. No pronator drift.  DTRs:  2+ throughout.  Gait- deferred.  Labs I have  reviewed labs in epic and the results pertinent to this consultation are:  CBC    Component Value Date/Time   WBC 18.2 (H) 07/06/2021 1221   RBC 4.47 07/06/2021 1221   HGB 13.1 07/06/2021 1221   HCT 38.6 (L) 07/06/2021 1221   PLT 306 07/06/2021 1221   MCV 86.4 07/06/2021 1221   MCH 29.3 07/06/2021 1221   MCHC 33.9 07/06/2021 1221   RDW 14.6 07/06/2021 1221   LYMPHSABS 1.3 07/06/2021 1221   MONOABS 1.2 (H) 07/06/2021 1221   EOSABS 0.0 07/06/2021 1221   BASOSABS 0.0 07/06/2021 1221    CMP     Component Value Date/Time   NA 129 (L) 07/06/2021 1221   K 3.7 07/06/2021 1221   CL 96 (L) 07/06/2021 1221   CO2 25 07/06/2021 1221   GLUCOSE 161 (H) 07/06/2021 1221   BUN 30 (H) 07/06/2021 1221   CREATININE 0.77 07/06/2021 1221   CALCIUM 8.3 (L) 07/06/2021 1221   PROT 5.8 (L) 07/06/2021 1221   ALBUMIN 3.1 (L) 07/06/2021 1221   AST 28 07/06/2021 1221   ALT 84 (H) 07/06/2021 1221   ALKPHOS 46 07/06/2021 1221   BILITOT 0.7 07/06/2021 1221   GFRNONAA >60 07/06/2021 1221   GFRAA >90 10/23/2013 0520    Lipid Panel     Component Value Date/Time   CHOL 185 05/21/2021 1603   TRIG 165.0 (H) 05/21/2021 1603   HDL 35.70 (L) 05/21/2021 1603   CHOLHDL 5 05/21/2021 1603   VLDL 33.0 05/21/2021 1603   LDLCALC 116 (H) 05/21/2021 1603   Imaging  CT head 1. New hypodensity and loss of gray-white differentiation in the anterior left temporal lobe, most likely an acute or subacute infarct. Recommend MRI with contrast to confirm and to exclude other etiologies. 2. In comparison to recent CT head from June 29, 2021, no substantial change in the masslike densities in the right caudate and basal ganglia, further evaluated on prior MRI. 3. 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   MRI brain with and without contrast-pending.  Assessment: Unfortunate 65 yo male with recently noted multiple brain lesions suspicious for metastases. He is s/p stereotactic bx by NSU, but  pathology has not resulted. CTH with suspicion of stroke, which may explain his symptoms over 72 hours, but may also represent further lesions. A steroid taper is on his PTA list, but do not know when that was prescribed. NSU mentioned steroids if not improved.    Impression: -Brain lesions, multiple s/p stereotactic biopsy.  -CTH suspicious for infarct.   Recommendations/Plan:  -medicine admit.  - Frequent Neuro checks per stroke unit protocol - Recommend Vascular imaging with MRA Angio Head without contrast and US Carotid doppler - Recommend obtaining TTE - Recommend obtaining Lipid panel with LDL - Please start statin if LDL > 70 - Recommend HbA1c - Antithrombotic - Aspirin 81mg  daily(per note from Dr. Marcello Moores, okay to start aspirin after brain biopsy) - Recommend DVT ppx - SBP goal - permissive hypertension first 24 h < 220/110. Held home meds.  - Recommend Telemetry monitoring for arrythmia - Recommend bedside swallow screen prior to PO intake. - Stroke education booklet - Recommend PT/OT/SLP consult    Pt seen by Clance Boll, NP/Neuro and later by MD. Note/plan to be edited by MD as needed.  Pager: [phone]  NEUROHOSPITALIST ADDENDUM Performed a face to face diagnostic evaluation.   I have reviewed the contents of history and physical exam as documented by PA/ARNP/Resident and agree with above documentation.  I have discussed and formulated the above plan as documented. Edits to the note have been made as needed.  Impression/Key exam findings/Plan: 85M with brain biopsy of lesion concerning for malignancy a week ago who presents with intermittent mild aphasia/confusion. Per wife, fumbles word her and there and making few semantic and paraphasic errors. First noticed on Thursday. On exam, calls pinky/little finger a "little thumb" and does seem to get stuck occasionally on words. No other focal deficit noted. 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  In addition to  standard stroke workup, will get a routine EEG as not enitrely clear from exam if the aphasia is just mild vs intermittent. I would not expect stroke to present with intermittent symptoms.  Stroke workup as above along with a routine EEG. Stroke team to follow.    Adam Simpers, MD [facility] [phone]   If 7pm to 7am, please call on call as listed on AMION.

## 2021-07-07 ENCOUNTER — Encounter (HOSPITAL_COMMUNITY): Payer: 59

## 2021-07-07 ENCOUNTER — Observation Stay (HOSPITAL_COMMUNITY): Payer: 59

## 2021-07-07 DIAGNOSIS — E039 Hypothyroidism, unspecified: Secondary | ICD-10-CM

## 2021-07-07 DIAGNOSIS — R739 Hyperglycemia, unspecified: Secondary | ICD-10-CM | POA: Diagnosis present

## 2021-07-07 DIAGNOSIS — D72825 Bandemia: Secondary | ICD-10-CM

## 2021-07-07 DIAGNOSIS — I6350 Cerebral infarction due to unspecified occlusion or stenosis of unspecified cerebral artery: Secondary | ICD-10-CM | POA: Diagnosis present

## 2021-07-07 DIAGNOSIS — I639 Cerebral infarction, unspecified: Secondary | ICD-10-CM | POA: Diagnosis present

## 2021-07-07 DIAGNOSIS — Z825 Family history of asthma and other chronic lower respiratory diseases: Secondary | ICD-10-CM | POA: Diagnosis not present

## 2021-07-07 DIAGNOSIS — Z8249 Family history of ischemic heart disease and other diseases of the circulatory system: Secondary | ICD-10-CM | POA: Diagnosis not present

## 2021-07-07 DIAGNOSIS — Z79899 Other long term (current) drug therapy: Secondary | ICD-10-CM | POA: Diagnosis not present

## 2021-07-07 DIAGNOSIS — Z91018 Allergy to other foods: Secondary | ICD-10-CM | POA: Diagnosis not present

## 2021-07-07 DIAGNOSIS — R7989 Other specified abnormal findings of blood chemistry: Secondary | ICD-10-CM | POA: Diagnosis not present

## 2021-07-07 DIAGNOSIS — G9340 Encephalopathy, unspecified: Secondary | ICD-10-CM

## 2021-07-07 DIAGNOSIS — R262 Difficulty in walking, not elsewhere classified: Secondary | ICD-10-CM

## 2021-07-07 DIAGNOSIS — I10 Essential (primary) hypertension: Secondary | ICD-10-CM | POA: Diagnosis present

## 2021-07-07 DIAGNOSIS — J45909 Unspecified asthma, uncomplicated: Secondary | ICD-10-CM | POA: Diagnosis present

## 2021-07-07 DIAGNOSIS — E861 Hypovolemia: Secondary | ICD-10-CM | POA: Diagnosis present

## 2021-07-07 DIAGNOSIS — K219 Gastro-esophageal reflux disease without esophagitis: Secondary | ICD-10-CM | POA: Diagnosis present

## 2021-07-07 DIAGNOSIS — Z881 Allergy status to other antibiotic agents status: Secondary | ICD-10-CM | POA: Diagnosis not present

## 2021-07-07 DIAGNOSIS — Z7951 Long term (current) use of inhaled steroids: Secondary | ICD-10-CM | POA: Diagnosis not present

## 2021-07-07 DIAGNOSIS — Z923 Personal history of irradiation: Secondary | ICD-10-CM | POA: Diagnosis not present

## 2021-07-07 DIAGNOSIS — R7303 Prediabetes: Secondary | ICD-10-CM

## 2021-07-07 DIAGNOSIS — E785 Hyperlipidemia, unspecified: Secondary | ICD-10-CM | POA: Diagnosis present

## 2021-07-07 DIAGNOSIS — I63512 Cerebral infarction due to unspecified occlusion or stenosis of left middle cerebral artery: Secondary | ICD-10-CM | POA: Diagnosis not present

## 2021-07-07 DIAGNOSIS — E871 Hypo-osmolality and hyponatremia: Secondary | ICD-10-CM | POA: Diagnosis present

## 2021-07-07 DIAGNOSIS — I679 Cerebrovascular disease, unspecified: Secondary | ICD-10-CM | POA: Diagnosis not present

## 2021-07-07 DIAGNOSIS — R269 Unspecified abnormalities of gait and mobility: Secondary | ICD-10-CM | POA: Diagnosis present

## 2021-07-07 DIAGNOSIS — I6389 Other cerebral infarction: Secondary | ICD-10-CM | POA: Diagnosis not present

## 2021-07-07 DIAGNOSIS — Z91048 Other nonmedicinal substance allergy status: Secondary | ICD-10-CM | POA: Diagnosis not present

## 2021-07-07 DIAGNOSIS — Z20822 Contact with and (suspected) exposure to covid-19: Secondary | ICD-10-CM | POA: Diagnosis present

## 2021-07-07 DIAGNOSIS — Z9079 Acquired absence of other genital organ(s): Secondary | ICD-10-CM | POA: Diagnosis not present

## 2021-07-07 DIAGNOSIS — Z8546 Personal history of malignant neoplasm of prostate: Secondary | ICD-10-CM | POA: Diagnosis not present

## 2021-07-07 DIAGNOSIS — T502X5A Adverse effect of carbonic-anhydrase inhibitors, benzothiadiazides and other diuretics, initial encounter: Secondary | ICD-10-CM | POA: Diagnosis present

## 2021-07-07 DIAGNOSIS — Z8673 Personal history of transient ischemic attack (TIA), and cerebral infarction without residual deficits: Secondary | ICD-10-CM | POA: Diagnosis not present

## 2021-07-07 LAB — HEMOGLOBIN A1C
Hgb A1c MFr Bld: 6.2 % — ABNORMAL HIGH (ref 4.8–5.6)
Mean Plasma Glucose: 131.24 mg/dL

## 2021-07-07 LAB — RENAL FUNCTION PANEL
Albumin: 2.7 g/dL — ABNORMAL LOW (ref 3.5–5.0)
Anion gap: 7 (ref 5–15)
BUN: 26 mg/dL — ABNORMAL HIGH (ref 8–23)
CO2: 28 mmol/L (ref 22–32)
Calcium: 7.9 mg/dL — ABNORMAL LOW (ref 8.9–10.3)
Chloride: 96 mmol/L — ABNORMAL LOW (ref 98–111)
Creatinine, Ser: 0.74 mg/dL (ref 0.61–1.24)
GFR, Estimated: >60 mL/min (ref >60)
Glucose, Bld: 103 mg/dL — ABNORMAL HIGH (ref 70–99)
Phosphorus: 4.5 mg/dL (ref 2.5–4.6)
Potassium: 4.1 mmol/L (ref 3.5–5.1)
Sodium: 131 mmol/L — ABNORMAL LOW (ref 135–145)

## 2021-07-07 LAB — CBC
HCT: 36.2 % — ABNORMAL LOW (ref 39.0–52.0)
Hemoglobin: 12.7 g/dL — ABNORMAL LOW (ref 13.0–17.0)
MCH: 29.4 pg (ref 26.0–34.0)
MCHC: 35.1 g/dL (ref 30.0–36.0)
MCV: 83.8 fL (ref 80.0–100.0)
Platelets: 264 10*3/uL (ref 150–400)
RBC: 4.32 MIL/uL (ref 4.22–5.81)
RDW: 14.7 % (ref 11.5–15.5)
WBC: 12.7 10*3/uL — ABNORMAL HIGH (ref 4.0–10.5)
nRBC: 0 % (ref 0.0–0.2)

## 2021-07-07 LAB — ECHOCARDIOGRAM COMPLETE BUBBLE STUDY
Area-P 1/2: 3.2 cm2
Calc EF: 59.7 %
S' Lateral: 4.5 cm
Single Plane A2C EF: 65.7 %
Single Plane A4C EF: 49.2 %

## 2021-07-07 LAB — VITAMIN B12: Vitamin B-12: 585 pg/mL (ref 180–914)

## 2021-07-07 LAB — LIPID PANEL
Cholesterol: 108 mg/dL (ref 0–200)
HDL: 48 mg/dL (ref >40)
LDL Cholesterol: 53 mg/dL (ref 0–99)
Total CHOL/HDL Ratio: 2.3 RATIO
Triglycerides: 35 mg/dL (ref <150)
VLDL: 7 mg/dL (ref 0–40)

## 2021-07-07 LAB — T3, FREE: T3, Free: 1.7 pg/mL — ABNORMAL LOW (ref 2.0–4.4)

## 2021-07-07 LAB — GLUCOSE, CAPILLARY
Glucose-Capillary: 84 mg/dL (ref 70–99)
Glucose-Capillary: 107 mg/dL — ABNORMAL HIGH (ref 70–99)
Glucose-Capillary: 177 mg/dL — ABNORMAL HIGH (ref 70–99)
Glucose-Capillary: 164 mg/dL — ABNORMAL HIGH (ref 70–99)

## 2021-07-07 LAB — HIV ANTIBODY (ROUTINE TESTING W REFLEX): HIV Screen 4th Generation wRfx: NONREACTIVE

## 2021-07-07 LAB — MAGNESIUM: Magnesium: 2 mg/dL (ref 1.7–2.4)

## 2021-07-07 LAB — SARS CORONAVIRUS 2 (TAT 6-24 HRS): SARS Coronavirus 2: NEGATIVE

## 2021-07-07 MED ORDER — LEVOTHYROXINE SODIUM 100 MCG/5ML IV SOLN
37.0000 ug | Freq: Every day | INTRAVENOUS | Status: DC
  Administered 2021-07-08: 37 ug via INTRAVENOUS
  Filled 2021-07-07: qty 5

## 2021-07-07 MED ORDER — DEXAMETHASONE SODIUM PHOSPHATE 4 MG/ML IJ SOLN: 1.0000 mg | Freq: Every day | INTRAMUSCULAR | Status: DC

## 2021-07-07 MED ORDER — CLOPIDOGREL BISULFATE 75 MG PO TABS
75.0000 mg | ORAL_TABLET | Freq: Every day | ORAL | Status: DC
  Administered 2021-07-07 – 2021-07-08 (×2): 75 mg via ORAL
  Filled 2021-07-07 (×2): qty 1

## 2021-07-07 MED ORDER — DEXAMETHASONE SODIUM PHOSPHATE 4 MG/ML IJ SOLN: 1.0000 mg | Freq: Two times a day (BID) | INTRAMUSCULAR | Status: DC

## 2021-07-07 MED ORDER — LEVOTHYROXINE SODIUM 100 MCG/5ML IV SOLN
44.0000 ug | Freq: Every day | INTRAVENOUS | Status: DC
  Administered 2021-07-07: 44 ug via INTRAVENOUS
  Filled 2021-07-07: qty 5

## 2021-07-07 MED ORDER — PERFLUTREN LIPID MICROSPHERE
1.0000 mL | INTRAVENOUS | Status: AC | PRN
  Administered 2021-07-07: 2 mL via INTRAVENOUS
  Filled 2021-07-07: qty 10

## 2021-07-07 MED ORDER — DEXAMETHASONE SODIUM PHOSPHATE 4 MG/ML IJ SOLN
2.0000 mg | Freq: Three times a day (TID) | INTRAMUSCULAR | Status: AC
  Administered 2021-07-07 (×3): 2 mg via INTRAVENOUS
  Filled 2021-07-07 (×3): qty 1

## 2021-07-07 MED ORDER — ASPIRIN EC 81 MG PO TBEC
81.0000 mg | DELAYED_RELEASE_TABLET | Freq: Every day | ORAL | Status: DC
  Administered 2021-07-08: 81 mg via ORAL
  Filled 2021-07-07 (×2): qty 1

## 2021-07-07 MED ORDER — SODIUM CHLORIDE 0.9 % IV SOLN: INTRAVENOUS | Status: DC

## 2021-07-07 MED ORDER — INSULIN ASPART 100 UNIT/ML IJ SOLN
0.0000 [IU] | INTRAMUSCULAR | Status: DC
  Administered 2021-07-07 – 2021-07-08 (×2): 2 [IU] via SUBCUTANEOUS

## 2021-07-07 MED ORDER — DEXAMETHASONE SODIUM PHOSPHATE 4 MG/ML IJ SOLN
2.0000 mg | Freq: Two times a day (BID) | INTRAMUSCULAR | Status: DC
  Administered 2021-07-08: 2 mg via INTRAVENOUS
  Filled 2021-07-07: qty 1

## 2021-07-07 MED ORDER — ACETAMINOPHEN 10 MG/ML IV SOLN
1000.0000 mg | Freq: Four times a day (QID) | INTRAVENOUS | Status: AC | PRN
  Filled 2021-07-07: qty 100

## 2021-07-07 MED ORDER — SODIUM CHLORIDE 0.9% FLUSH: 9.0000 mL | INTRAVENOUS | Status: DC | PRN

## 2021-07-07 MED ORDER — ASPIRIN 300 MG RE SUPP
150.0000 mg | Freq: Every day | RECTAL | Status: DC
  Administered 2021-07-07: 150 mg via RECTAL
  Filled 2021-07-07: qty 1

## 2021-07-07 MED ORDER — LEVETIRACETAM IN NACL 500 MG/100ML IV SOLN
500.0000 mg | Freq: Two times a day (BID) | INTRAVENOUS | Status: DC
  Administered 2021-07-07 (×3): 500 mg via INTRAVENOUS
  Filled 2021-07-07 (×3): qty 100

## 2021-07-07 MED ORDER — IOHEXOL 350 MG/ML SOLN
100.0000 mL | Freq: Once | INTRAVENOUS | Status: AC | PRN
  Administered 2021-07-07: 100 mL via INTRAVENOUS

## 2021-07-07 MED ORDER — PANTOPRAZOLE SODIUM 40 MG IV SOLR
40.0000 mg | Freq: Every day | INTRAVENOUS | Status: DC
  Administered 2021-07-07 – 2021-07-08 (×2): 40 mg via INTRAVENOUS
  Filled 2021-07-07 (×2): qty 40

## 2021-07-07 NOTE — Evaluation (Signed)
Occupational Therapy Evaluation Patient Details Name: NANDAN WILLEMS MRN: [id number] DOB: 08/02/1956 Today's Date: 07/07/2021    History of Present Illness 65 y/o presented to ED on 7/8 with complaints of confusion and "word salad". 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 Recently admitted 6/29-7/1 for progressive neurologic decline and was found to have multiple progressive enhancing lesions in his brain. CT of head revealed progression of R caudate and basal ganglia tumors. Metastatic work-up was negative. He underwent elective biopsy of the caudate head lesion 6/30. 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   Clinical Impression   Patient admitted for the diagnosis above.  PTA he lives with his spouse, who is able to care for his needs.  Per PT and chart review the patient was mobile in the home with supervision, no longer drives, and needed Min guard for pant management in stand.  Barriers are listed below.  Currently he appears very close to his baseline for ADL completion in sit, but is more unsteady, and needing closer to [place] for stand activities including in room mobility.  OT will follow acutely, but no post acute OT is needed.      Follow Up Recommendations  No OT follow up    Equipment Recommendations  None recommended by OT    Recommendations for Other Services       Precautions / Restrictions Precautions Precautions: Fall Precaution Comments: incontinence Restrictions Weight Bearing Restrictions: No      Mobility Bed Mobility Overal bed mobility: Needs Assistance Bed Mobility: Supine to Sit;Sit to Supine     Supine to sit: Min assist Sit to supine: Min  guard     Patient Response: Flat affect  Transfers Overall transfer level: Needs assistance Equipment used: None Transfers: Sit to/from Stand Sit to Stand: Min assist              Balance Overall balance assessment: Needs assistance Sitting-balance support: Feet supported Sitting balance-Leahy Scale: Fair     Standing balance support: Single extremity supported Standing balance-Leahy Scale: Fair                             ADL either performed or assessed with clinical judgement   ADL Overall ADL's : Needs assistance/impaired                     Lower Body Dressing: Minimal assistance   Toilet Transfer: Minimal assistance   Toileting- Clothing Manipulation and Hygiene: Minimal assistance       Functional mobility during ADLs: Minimal assistance General ADL Comments: unsteadiness noted     Vision Baseline Vision/History: Wears glasses Wears Glasses: Distance only Patient Visual Report: No change from baseline       Perception     Praxis      Pertinent Vitals/Pain Pain Assessment: No/denies pain     Hand Dominance Right   Extremity/Trunk Assessment Upper Extremity Assessment Upper Extremity Assessment: Generalized weakness LUE Sensation: WNL LUE Coordination: decreased fine motor   Lower Extremity Assessment Lower Extremity Assessment: Defer to PT evaluation       Communication Communication Communication: Expressive difficulties   Cognition Arousal/Alertness: Awake/alert Behavior During Therapy: Flat affect Overall  Cognitive Status: History of cognitive impairments - at baseline                     Current Attention Level: Sustained Memory: Decreased short-term memory Following Commands: Follows one step commands inconsistently;Follows one step commands with increased time Safety/Judgement: Decreased awareness of safety;Decreased awareness of deficits Awareness: Emergent Problem Solving: Slow  processing;Decreased initiation;Requires verbal cues General Comments: OT perfromed SBT on last admit.  Per eval patient scored a 10 on the Short Blessed Test;poor working memory; attentional deficits; apparent difficulty with executive level skills.   General Comments       Exercises     Shoulder Instructions      Home Living Family/patient expects to be discharged to:: Private residence Living Arrangements: Spouse/significant other Available Help at Discharge: Family;Available 24 hours/day Type of Home: House Home Access: Stairs to enter [employer] of Steps: 4 Entrance Stairs-Rails: Right Home Layout: One level     Bathroom Shower/Tub: Teacher, early years/pre: Standard Bathroom Accessibility: Yes How Accessible: Accessible via walker Home Equipment: Transport chair      Lives With: Significant other    Prior Functioning/Environment Level of Independence: Independent        Comments: Uses walking stick on uneven surfaces. Wife assist with community mobility, med management, meal prep and home management. Wife assists in donning of pants.        OT Problem List: Decreased strength;Decreased activity tolerance;Impaired balance (sitting and/or standing);Impaired vision/perception;Decreased coordination;Decreased cognition;Decreased safety awareness;Decreased knowledge of use of DME or AE;Obesity      OT Treatment/Interventions: Self-care/ADL training;Therapeutic exercise;Therapeutic activities;Balance training    OT Goals(Current goals can be found in the care plan section) Acute Rehab OT Goals Patient Stated Goal: none stated OT Goal Formulation: With patient Time For Goal Achievement: 07/21/21 Potential to Achieve Goals: Fair ADL Goals Pt Will Perform Lower Body Bathing: with min guard assist;sit to/from stand Pt Will Perform Lower Body Dressing: with min guard assist;sit to/from stand Pt Will Transfer to Toilet: with modified  independence;ambulating;regular height toilet Pt Will Perform Toileting - Clothing Manipulation and hygiene: with supervision;sit to/from stand  OT Frequency: Min 2X/week   Barriers to D/C:    none noted       Co-evaluation              AM-PAC OT "6 Clicks" Daily Activity     Outcome Measure Help from another person eating meals?: None Help from another person taking care of personal grooming?: None Help from another person toileting, which includes using toliet, bedpan, or urinal?: A Little Help from another person bathing (including washing, rinsing, drying)?: A Little Help from another person to put on and taking off regular upper body clothing?: A Little Help from another person to put on and taking off regular lower body clothing?: A Little 6 Click Score: 20   End of Session    Activity Tolerance: Patient tolerated treatment well Patient left: in bed;with call bell/phone within reach;with bed alarm set  OT Visit Diagnosis: Unsteadiness on feet (R26.81);Other abnormalities of gait and mobility (R26.89);Muscle weakness (generalized) (M62.81);Other symptoms and signs involving cognitive function                Time: 7616-0737 OT Time Calculation (min): 18 min Charges:  OT General Charges $OT Visit: 1 Visit OT Evaluation $OT Eval Moderate Complexity: 1 Mod  07/07/2021  Rich, OTR/L  Acute Rehabilitation Services  Office:  [place] 07/07/2021, 4:51  PM

## 2021-07-07 NOTE — Progress Notes (Addendum)
STROKE TEAM PROGRESS NOTE   ATTENDING NOTE: I reviewed above note and agree with the assessment and plan. Pt was seen and examined.   65 year old male has complex history of brain tumor status post resection and shunt placement.  Per Dr. 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  Unfortunately, postoperatively, he suffered a large hemorrhage which required emergent surgery and he had a difficult recovery.  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  He had a intraoperative shunt placed by Dr. Delice Lesch and followed up with Dr. Hal Neer.  He underwent postoperative radiation in [place], but the wife does not know the details of the radiation.  He underwent placement of a right frontal shunt, but developed a hemorrhage postoperatively that required emergency surgery.  He then required another surgery for additional hematoma evacuation and shunt removal.  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  During these postoperative bleeding episodes, he was evaluated by a hematologist and was diagnosed with a bleeding disorder, though his wife does not recall what the the name of it is.  He has had chronic difficulties neurologically, but the wife is noted more recently that he has had worsening shuffling gait, sleepiness, and memory difficulties.  She notes that he has little energy and usually sits in front of the TV and falls asleep on the couch.  He had neurologic workup which included MRI which showed enhancing lesions in his right caudate, right basal ganglia, and right pons."     He was admitted on 06/27/2021 for brain biopsy of the enhancing lesions and was discharged on 06/29/2021.  Currently, brain biopsy results still pending. However, he was readmitted yesterday for speech difficulty, wobbly gait, difficulty getting up, confusion and drowsy sleepiness.  CT head showed  left temporal lobe infarct.  MRI with and without contrast showed left temporal lobe large infarct, but also increased size of right carotid and right basal ganglia lesions, however, the right pons enhancing lesion has resolved, concerning for previous subacute infarct.  EEG negative for seizure, LE venous Doppler negative for DVT, EF 55 to 60%.  A1c 6.2, LDL 53, creatinine 0.77, sodium 129.  However, CT head and neck showed severe stenosis versus short segment occlusion of distal left M1, severe stenosis versus occlusion of the right ICA terminus, severe stenosis right M1 and proximal right M2, very small vertebrobasilar system with bilateral fetal PCAs, multifocal moderate to severe stenosis of small left PCA and severe stenosis of distal right P2, moderate bilateral paraclinoid ICA stenosis.   On exam, patient drowsy sleepy initially, however more awake alert and end of the examination.  He was able to open eyes, orientated to age and people, however not orientated to time or place or situation.  Follows most simple commands, paucity of speech, answer "I am fine" for most of questions, however able to say " I need to pee" by himself. Naming 2/4 and able to repeat simple sentences.  Chronic left ptosis, eyes mid position, no gaze palsy, visual field full.  Slight left nasolabial fold flattening, tongue midline.  Lateral upper and lower extremities symmetrical motor strength, BUEs 4/5 and BLEs 3/5.  Finger-to-nose grossly intact bilaterally.  Sensation subjectively symmetrical.  Gait not tested.   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  The etiology for patient diffuse intracranial vasculopathy not quite clear, concerning for brain radiation related, however diffuse atherosclerosis also  possible.  Given her previous " bleeding disorder", recommend aspirin 81 (instead of ASA 325) and Plavix 75 for 3 months and then aspirin alone.  Continue Vytorin and Keppra.   PT/OT/speech.  However, patient has high risk of recurrent stroke given severe stenosis/occlusion of intracranial vasculature. We will follow.   Rosalin Hawking, MD PhD Stroke Neurology 07/07/2021 2:59 PM   I had long discussion with wife at bedside, updated pt current condition, treatment plan and potential prognosis, and answered all the questions.  She expressed understanding and appreciation. I spent  35 minutes in total face-to-face time with the patient, more than 50% of which was spent in counseling and coordination of care, reviewing test results, images and medication, and discussing the diagnosis, treatment plan and potential prognosis. This patient's care requiresreview of multiple databases, neurological assessment, discussion with family, other specialists and medical decision making of high complexity.   INTERVAL HISTORY His wife is at the bedside.  She does most of the talking during this evaluation.   Vitals:   07/06/21 2030 07/07/21 0555 07/07/21 0750 07/07/21 0800  BP: (!) 158/68 (!) 153/71 (!) 143/61   Pulse: (!) 54 (!) 54 (!) 43 (!) 52  Resp: 15 18 14    Temp:  97.9 F (36.6 C) 98.2 F (36.8 C)   TempSrc:  Oral Oral   SpO2: 98% 100% 98%    CBC:  Recent Labs  Lab 07/06/21 1221 07/07/21 1004  WBC 18.2* 12.7*  NEUTROABS 14.8*  --   HGB 13.1 12.7*  HCT 38.6* 36.2*  MCV 86.4 83.8  PLT 306 350   Basic Metabolic Panel:  Recent Labs  Lab 07/06/21 1221 07/07/21 1004  NA 129* 131*  K 3.7 4.1  CL 96* 96*  CO2 25 28  GLUCOSE 161* 103*  BUN 30* 26*  CREATININE 0.77 0.74  CALCIUM 8.3* 7.9*  MG  --  2.0  PHOS  --  4.5    Lipid Panel:  Recent Labs  Lab 07/07/21 0436  CHOL 108  TRIG 35  HDL 48  CHOLHDL 2.3  VLDL 7  LDLCALC 53    HgbA1c:  Recent Labs  Lab 07/07/21 0436  HGBA1C 6.2*   Urine Drug Screen: No results for input(s): LABOPIA, COCAINSCRNUR, LABBENZ, AMPHETMU, THCU, LABBARB in the last 168 hours.  Alcohol Level No results for input(s): ETH in  the last 168 hours.  IMAGING past 24 hours CT ANGIO HEAD NECK W WO CM  Result Date: 07/07/2021 CLINICAL DATA:  Stroke follow-up. EXAM: CT ANGIOGRAPHY HEAD AND NECK TECHNIQUE: Multidetector CT imaging of the head and neck was performed using the standard protocol during bolus administration of intravenous contrast. Multiplanar CT image reconstructions and MIPs were obtained to evaluate the vascular anatomy. Carotid stenosis measurements (when applicable) are obtained utilizing NASCET criteria, using the distal internal carotid diameter as the denominator. CONTRAST:  142mL OMNIPAQUE IOHEXOL 350 MG/ML SOLN COMPARISON:  None. FINDINGS: CT HEAD FINDINGS Brain: Mildly increased edema associated with the acute left anterior temporal lobe infarct. Local sulcal effacement without midline shift. No substantial change in right caudate and right basal ganglia lesions and small focus of right pontine encephalomalacia, better characterized on yesterday's MRI. Similar position of a right parietal approach ventriculostomy catheter which terminates in the atrium right lateral ventricle. Similar position of an additional midline occipital approach catheter which terminates between the frontal horns of the lateral ventricles. No evidence of hydrocephalus. No definite acute hemorrhage. No evidence of interval large vascular territory infarct. Vascular: See  below. Skull: Mild scattered paranasal sinus mucosal thickening. Sinuses: Mild mucosal thickening.  No acute orbital findings. Orbits: No mastoid effusions. Review of the MIP images confirms the above findings CTA NECK FINDINGS Motion limited evaluation. Aortic arch: Aortic atherosclerosis. Great vessel origins are patent. Right carotid system: Motion limited evaluation without evidence of significant (greater than 50%) stenosis. Mild calcific and noncalcific atherosclerosis at the carotid bifurcation. Left carotid system: Motion limited evaluation without evidence of greater  than 50% stenosis. Mild to moderate calcific and noncalcific atherosclerosis at the carotid bifurcation Vertebral arteries: Mildly right dominant. Essentially nondiagnostic evaluation of the origins/proximal vertebral arteries due to motion and beam hardening artifact with suspected severe stenosis of the left vertebral artery origin. More distal vertebral arteries are patent in the neck. Skeleton: Multilevel severe facet arthropathy. Other neck: No acute abnormality. Upper chest: Visualized lung apices are clear. Review of the MIP images confirms the above findings CTA HEAD FINDINGS Motion limited evaluation. Anterior circulation: Calcific atherosclerosis of bilateral cavernous and paraclinoid ICAs with suspected moderate bilateral paraclinoid ICA stenosis. Severe stenosis versus occlusion of the right ICA terminus. Irregular opacification of a severely narrowed right M1 MCA with opacification of proximal M2 MCA branches. Small, irregular right A1 ACA. The left A1 ACA and visualized proximal A2 ACA branches are patent. Posterior circulation: Motion limited evaluation with very small vertebrobasilar system with bilateral posterior communicating arteries largely supplying the PCAs. Poor visualization of a small right intradural vertebral artery and only minimal opacification of a small basilar artery. Focal loss of opacification of the distal left vertebral artery may represent a superimposed stenosis. The small left vertebral artery appears to largely terminate as PICA. Suspected multifocal moderate to severe stenosis of the small left PCA and severe stenosis of the distal right P2 PCA. Venous sinuses: As permitted by contrast timing, patent. Review of the MIP images confirms the above findings IMPRESSION: CT head: 1. Mildly increased edema associated with the acute left anterior temporal lobe infarct. Local sulcal effacement without midline shift. 2. No substantial change in right caudate and right basal ganglia  lesions and small focus of right pontine encephalomalacia, better characterized on yesterday's MRI. 3. Similar additional chronic findings, as described above. CTA head: 1. Severe stenosis versus short-segment occlusion of the distal left M1 MCA. 2. Severe stenosis versus occlusion of the right ICA terminus. Irregular opacification of a severely narrowed right M1 MCA with opacification of proximal M2 MCA branches. Small, irregular right A1 ACA as well. 3. Motion limited evaluation with very small vertebrobasilar system with bilateral posterior communicating arteries largely supplying the PCAs. Poor visualization of a small right intradural vertebral artery and only minimal opacification of a very small basilar artery. Focal loss of opacification of the distal left vertebral artery may represent a superimposed severe stenosis or occlusion. The small left vertebral artery appears to largely terminate as PICA. 4. Suspected multifocal moderate to severe stenosis of the small left PCA and severe stenosis of the distal right P2 PCA. 5. Moderate bilateral paraclinoid ICA stenosis. CTA neck: 1. Essentially nondiagnostic evaluation of the proximal vertebral arteries due to motion and beam hardening artifact with suspected severe stenosis of the left vertebral artery origin. More distal vertebral arteries are patent in the neck. 2. No significant (greater than 50%) stenosis of the carotid arteries in the neck. Findings discussed with Dr. Erlinda Hong at 11:29 a.m. via telephone. Electronically Signed   By: Margaretha Sheffield MD   On: 07/07/2021 12:04   MR Brain W and Lottie Dawson  Contrast  Result Date: 07/06/2021 CLINICAL DATA:  Neuro deficit, acute stroke suspected. EXAM: MRI HEAD WITHOUT AND WITH CONTRAST TECHNIQUE: Multiplanar, multiecho pulse sequences of the brain and surrounding structures were obtained without and with intravenous contrast. CONTRAST:  29mL GADAVIST GADOBUTROL 1 MMOL/ML IV SOLN COMPARISON:  MRI May 24, 2021. FINDINGS:  Brain: The hypoattenuation in the left temporal lobe seen on same day CT head correlates with nonenhancing restricted diffusion, most consistent with acute infarct.Associated edema and local sulcal effacement. Increased size of expansile T2/FLAIR hyperintensity within the right caudate with increasingly heterogeneous enhancement, now measuring 2.9 x 2.0 cm on series 9, image 14, previously 2.5 x 1.5 cm when remeasured. Enhancement abuts and is inseparable from the frontal horn of the right lateral ventricle, separate. Heterogeneous enhancement also extends into adjacent frontal white matter superiorly. Additionally, mildly increased size of the expansile, peripherally enhancing lesion in the posterior limb of the right internal capsule, now measuring approximately 1.5 cm on series 9, image 12, previously 1.2 cm when remeasured. Both these areas demonstrate restricted diffusion, suggestive of hypercellularity. The previously seen enhancement in the right pons has resolved with small focus of T2 hyperintensity in this region. Similar chronic hemorrhagic lacunar infarcts in bilateral basal ganglia and chronic microhemorrhages in the thalami and elsewhere in the cerebral and cerebellum, as well as the brainstem. Right parietal approach ventriculostomy catheter is again noted a terminating in the atrium of the right lateral ventricle. Additionally, there is a midline ventriculostomy catheter which terminates in similar position. No hydrocephalus. Remote ventriculostomy catheter tract in the right frontal lobe, similar. No extra-axial fluid collection.  No midline shift. Vascular: Major arterial flow voids are maintained at the skull base. Small vertebrobasilar system, similar to prior. Skull and upper cervical spine: Normal marrow signal. Sinuses/Orbits: Mild sinus mucosal thickening.  Unremarkable orbits. IMPRESSION: 1. The hypoattenuation in the left temporal lobe seen on same day CT head correlates with nonenhancing  restricted diffusion, compatible with acute infarct. Associated edema and local sulcal effacement. Additional small nonenhancing focus of restricted diffusion in the right paramidline pons likely represents an additional acute infarct 2. Increased size/bulk of expansile, heterogeneously enhancing masses in the right caudate and smaller lesion in the adjacent posterior limb of the internal capsule, as described above. 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 Atypical infection is considered less likely. Recommend correlation with pathology results of the recent biopsy. 3. The previously seen area of enhancement in the right pons has resolved with focal T2 hyperintensity in this region. Given the apparent resolution of enhancement, this area may have previously represented an enhancing subacute infarct, but this warrants continued attention on follow-up. Findings discussed with Dr. Vanita Panda at 7:18 p.m. via telephone. Electronically Signed   By: Margaretha Sheffield MD   On: 07/06/2021 19:38   EEG adult  Result Date: 07/07/2021 Lora Havens, MD     07/07/2021  8:47 AM Patient Name: Adam Sullivan MRN: [id number] Epilepsy Attending: Lora Havens Referring Physician/Provider: Dr Donnetta Simpers Date: 79/2022 Duration: 20.29 mins Patient history:  43M with brain biopsy of lesion concerning for malignancy a week ago who presents with intermittent mild aphasia/confusion. EEG to evaluate for seizure. Level of alertness: Awake AEDs during EEG study: LEV Technical aspects: This EEG study was done with scalp electrodes positioned according to the 10-20 International system of electrode placement. Electrical activity was acquired at a sampling rate of 500Hz  and reviewed with  a high frequency filter of 70Hz  and a low frequency filter of 1Hz . EEG data were recorded continuously and digitally stored. Description: The  posterior dominant rhythm consists of 8Hz  activity of moderate voltage (25-35 uV) seen predominantly in posterior head regions, symmetric and reactive to eye opening and eye closing. EEG showed continuous generalized left > right frontal 3 to 6 Hz theta-delta slowing.  Hyperventilation and photic stimulation were not performed.   ABNORMALITY - Continuous slow,left>right frontal region IMPRESSION: This study is suggestive of cortical dysfunction arising from left> right frontal region likely secondary to underlying structural abnormality. No seizures or definite epileptiform discharges were seen throughout the recording. If suspicion for ictal-interictal activity remains a concern, a prolonged study can be considered. Priyanka O Yadav   VAS [place] LOWER EXTREMITY VENOUS (DVT)  Result Date: 07/07/2021  Lower Venous DVT Study Patient Name:  RIEL HIRSCHMAN  Date of Exam:   07/07/2021 Medical Rec #: [id number]     Accession #:    [id number] Date of Birth: 08-02-1956      Patient Gender: M Patient Age:   65Y Exam Location:  [facility] Procedure:      VAS [place] LOWER EXTREMITY VENOUS (DVT) Referring Phys: [id number] Rosalin Hawking --------------------------------------------------------------------------------  Indications: Stroke.  Comparison Study: no prior Performing Technologist: Archie Patten RVS  Examination Guidelines: A complete evaluation includes B-mode imaging, spectral Doppler, color Doppler, and power Doppler as needed of all accessible portions of each vessel. Bilateral testing is considered an integral part of a complete examination. Limited examinations for reoccurring indications may be performed as noted. The reflux portion of the exam is performed with the patient in reverse Trendelenburg.  +---------+---------------+---------+-----------+----------+--------------+ RIGHT    CompressibilityPhasicitySpontaneityPropertiesThrombus Aging  +---------+---------------+---------+-----------+----------+--------------+ CFV      Full           Yes      Yes                                 +---------+---------------+---------+-----------+----------+--------------+ SFJ      Full                                                        +---------+---------------+---------+-----------+----------+--------------+ FV Prox  Full                                                        +---------+---------------+---------+-----------+----------+--------------+ FV Mid   Full                                                        +---------+---------------+---------+-----------+----------+--------------+ FV DistalFull                                                        +---------+---------------+---------+-----------+----------+--------------+ PFV  Full                                                        +---------+---------------+---------+-----------+----------+--------------+ POP      Full           Yes      Yes                                 +---------+---------------+---------+-----------+----------+--------------+ PTV      Full                                                        +---------+---------------+---------+-----------+----------+--------------+ PERO     Full                                                        +---------+---------------+---------+-----------+----------+--------------+   +---------+---------------+---------+-----------+----------+--------------+ LEFT     CompressibilityPhasicitySpontaneityPropertiesThrombus Aging +---------+---------------+---------+-----------+----------+--------------+ CFV      Full           Yes      Yes                                 +---------+---------------+---------+-----------+----------+--------------+ SFJ      Full                                                         +---------+---------------+---------+-----------+----------+--------------+ FV Prox  Full                                                        +---------+---------------+---------+-----------+----------+--------------+ FV Mid   Full                                                        +---------+---------------+---------+-----------+----------+--------------+ FV DistalFull                                                        +---------+---------------+---------+-----------+----------+--------------+ PFV      Full                                                        +---------+---------------+---------+-----------+----------+--------------+  POP      Full           Yes      Yes                                 +---------+---------------+---------+-----------+----------+--------------+ PTV      Full                                                        +---------+---------------+---------+-----------+----------+--------------+ PERO     Full                                                        +---------+---------------+---------+-----------+----------+--------------+     Summary: BILATERAL: - No evidence of deep vein thrombosis seen in the lower extremities, bilaterally. -No evidence of popliteal cyst, bilaterally.   *See table(s) above for measurements and observations.    Preliminary     PHYSICAL EXAM   NEURO: Mental Status: lethargic but attempts to keep eyes open throughout exam. Follows some commands and not others. Oriented only to person at this time. Not oriented to place, month, day, date, or year. L  Speech/Language: speech is without dysarthria or aphasia. Word salad noted. He is able to name pen, and does recognize wife at bedside.  Cranial Nerves: II: PERRL 4 mm/brisk. visual fields full. III, IV, VI: OS ptosis chronic. Eyes dysconjugate. . V: sensation is intact and symmetrical to face. VII: Smile is symmetrical. Able to puff cheeks  and raise eyebrows. VIII:hearing intact to voice. IX, X: palate elevation is symmetric. Phonation normal. XI: normal sternocleidomastoid and trapezius muscle strength. VEH:MCNOBS is symmetrical without fasciculations.    Motor:  All muscle groups are 5/5 with exception of LUE triceps 4/5, Tone is normal. Bulk is decreased throughout. Sensation- Intact to light touch bilaterally in all four extremities. Extinction absent to light touch to DSS. Coordination: FTN intact bilaterally. Does not comprehend HKS. No pronator drift. DTRs:  2+ throughout.   ASSESSMENT/PLAN Adam Sullivan is a 65 y.o. 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 Marcello Moores of neurosurgery, hypertension, hyperlipidemia, GERD, hypothyroidism who presented to [facility] ED from home due to confusion, gait abnormality and word salad.  Neurology  was asked to evaluate for new infarct versus evolution of brain lesions. MRI brain done this admission shows left temporal lobe acute infarct with associated edema, as well as acute infarcts at  right pons. There are also multiple lesions seen at right caudate and internal capsule, concerning for progression of neoplasm.   Neurosurgery ( Dr. Marcello Moores) has seen patient during this admission and will continue to monitor; he had recent brain biopsy of lesions and results are pending.   Stroke:  left temporal lobe     CT head shows new hypodensity in left temporal lobe. 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     CTA head    1. Severe stenosis versus short-segment occlusion of the distal left M1 MCA.  2. Severe stenosis versus occlusion of the right ICA terminus. Irregular opacification of a severely narrowed right M1 MCA with opacification of proximal M2 MCA branches. Small, irregular right  A1 ACA as well. 3. Motion limited evaluation with very small vertebrobasilar system with bilateral posterior communicating arteries largely supplying the PCAs. Poor visualization of a small right intradural vertebral artery and only minimal opacification of a very small basilar artery. Focal loss of opacification of the distal left vertebral artery may represent a superimposed severe stenosis or occlusion. The small left vertebral artery appears to largely terminate as PICA. 4. Suspected multifocal moderate to severe stenosis of the small left PCA and severe stenosis of the distal right P2 PCA. 5. Moderate bilateral paraclinoid ICA stenosis.  CTA neck    suspected severe stenosis of the left vertebral artery origin. More distal vertebral arteries are patent in the neck. 2. No significant (greater than 50%) stenosis of the carotid arteries in the neck.  MRI    1. The hypoattenuation in the left temporal lobe seen on same day CT head correlates with nonenhancing restricted diffusion, compatible with acute infarct. Associated edema and local sulcal effacement. Additional small nonenhancing focus of restricted diffusion in the right paramidline pons likely represents an additional acute infarct 2. Increased size/bulk of expansile, heterogeneously enhancing masses in the right caudate and smaller lesion in the adjacent posterior limb of the internal capsule, as described above. 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 Atypical infection is considered less likely. Recommend correlation with pathology results of the recent biopsy. 3. The previously seen area of enhancement in the right pons has resolved with focal T2 hyperintensity in this region. Given the apparent resolution of enhancement, this area may have previously represented an enhancing subacute infarct, but this  warrants continued attention on follow-up.  Carotid Doppler  pending 2D Echo 55-60%EF, mod dilated LA, no IA shunt    LDL 53 HgbA1c 6.2 VTE prophylaxis - lovenox    Diet   Diet NPO time specified   No antithrombotic prior to admission, now on aspirin 81 mg daily and clopidogrel 75 mg daily for 3 months then aspirin 81 daily.  Therapy recommendations:  outpatient PT Disposition:  home  Plan to start patient on low dose aspirin and plavix  for 3 months, followed by aspirin only.   Hyperlipidemia LDL 53, goal < 70  High intensity statin zocor 40mg  daily Continue statin at discharge  Diabetes type II  Controlled HgbA1c 6.2, goal < 7.0 CBGs Recent Labs    07/06/21 2136 07/07/21 0619 07/07/21 1357  GLUCAP 115* 84 107*      Other Stroke Risk Factors  History of brain lesions with XRT History of prostate cancer post prostatectomy    Other Active Problems Leukocytosis  GERD  Hospital day # 0     To contact Stroke Continuity provider, please refer to [email or web address] After hours, contact General Neurology

## 2021-07-07 NOTE — Progress Notes (Signed)
PROGRESS NOTE  Adam Sullivan BZJ:[id number] DOB: 1956/04/11   PCP: Binnie Rail, MD  Patient is from: Home.  Lives with his wife.  DOA: 07/06/2021 LOS: 0  Chief complaints:  Chief Complaint  Patient presents with   Altered Mental Status     Brief Narrative / Interim history: 65 year old M with PMH of prostate cancer s/p prostatectomy in 2014, CVA in 2021, pineocytoma s/p resection, XRT and VPS in 1993, and recent stereotactic biopsy of brain lesions on 6/30 by Dr. Marcello Moores, HTN, hypothyroidism, HLD and GERD presenting with confusion, gait disturbance, weakness and word salad, and admitted for acute CVA with MRI showing anterior left temporal lobe acute or subacute infarct, and increased size/bulk of right caudate and posterior limb IC lesions.  Neurosurgery and neurology consulted, and recommended complete CVA work-up.   LE [place] negative for DVT.  EEG negative for seizure or epileptiform discharge.  TSH low at 0.057.  Free T4 1.43.  CTA head and neck and TTE pending.  Subjective: Seen and examined earlier this morning.  No major events overnight of this morning.  Patient is somewhat sleepy but wakes to voice.  He follows command.  He is oriented to self and person.  He thinks he is at church.  Responds no to pain  Objective: Vitals:   07/06/21 2030 07/07/21 0555 07/07/21 0750 07/07/21 0800  BP: (!) 158/68 (!) 153/71 (!) 143/61   Pulse: (!) 54 (!) 54 (!) 43 (!) 52  Resp: 15 18 14    Temp:  97.9 F (36.6 C) 98.2 F (36.8 C)   TempSrc:  Oral Oral   SpO2: 98% 100% 98%     Intake/Output Summary (Last 24 hours) at 07/07/2021 1117 Last data filed at 07/07/2021 0900 Gross per 24 hour  Intake 0 ml  Output 302 ml  Net -302 ml   There were no vitals filed for this visit.  Examination:  GENERAL: No apparent distress.  Nontoxic. HEENT: MMM.  Vision and hearing grossly intact.  NECK: Supple.  No apparent JVD.  RESP: On RA.  No IWOB.  Fair aeration bilaterally. CVS: Bradycardic to 68s.   Heart sounds normal.  ABD/GI/GU: BS+. Abd soft, NTND.  MSK/EXT:  Moves extremities. No apparent deformity. No edema.  SKIN: no apparent skin lesion or wound NEURO: Sleepy but wakes to voice.  Oriented to self and his wife.  He thinks he is at discharge.  He follows command.  Cranial nerves, motor, sensory and reflexes grossly intact.  No pronator drift.  Finger-to-nose intact.  PSYCH: Calm. Normal affect.   Procedures:  None  Microbiology summarized: COVID-19 PCR nonreactive.  Assessment & Plan: Left anterior temporal stroke, acute to early subacute-noted on MRI brain.  Presented with confusion, gait disturbance, generalized weakness and word salad.  He also has right caudate and IC lesions.  Neuro exam reassuring except his mental status.  He is somewhat sleepy but wakes to voice.  He is oriented only to self and his wife.  EEG without epileptiform discharge or seizure.  He has mild hyponatremia and low TSH with high free T4.  A1c 6.2%.  LDL is 53. -Neurology following -Continue aspirin and atorvastatin -Follow CTA head and neck and TTE. -Permissive hypertension up to 220/120 -PT/OT/SLP -Failed swallow eval.  Remains n.p.o. pending SLP clearance    Pineocytoma s/p resection, XRT and VPS in 1999 Brain lesions s/p stereotactic biopsy on 06/28/2021 by Dr. Marcello Moores -MRI revealed increased size in right caudate and IC lesions -On Decadron and  Keppra per neurosurgery. -Neurosurgery following   Hypovolemic hyponatremia-likely due to HCTZ.  Improved. -Continue holding HCTZ -Continue NS  Hypothyroidism: TSH low at 0.057.  Free T4 and T3 elevated at 1.43 and 1.7.  Per patient's wife, his Synthroid was increased to 88 mcg about a month ago.  -Decrease IV Synthroid to 37 mcg -Will discharge on 75 mcg -Needs TSH recheck in about 4 to 6 weeks  Acute encephalopathy: Multifactorial including CVA, brain lesions, hyponatremia, steroid or delirium.  Has history of B12 deficiency.  EEG negative for  seizure or epileptiform discharge.  MRI finding as above. -Treat treatable causes -Reorientation and delirium precautions. -Minimize/avoid sedating medications -Check vitamin B12 and RPR   Leukocytosis: Reactive in the setting of steroids use: Improved. -Continue monitoring   Hyperlipidemia -Resume home regimen when he starts taking p.o.   GERD -Resume home regimen   Prediabetes with hyperglycemia: A1c 6.2%.  Hyperglycemia likely due to steroid, and controlled Recent Labs  Lab 07/06/21 2136 07/07/21 0619  GLUCAP 115* 84  -SSI    Ambulatory dysfunction -Fall precaution and PT/OT  There is no height or weight on file to calculate BMI.         DVT prophylaxis:  enoxaparin (LOVENOX) injection 40 mg Start: 07/07/21 0800 SCDs Start: 07/06/21 2003  Code Status: Full code Family Communication: Patient and/or Therapist, sports.  Updated patient's wife at bedside. Level of care: Telemetry Medical Status is: Observation  The patient will require care spanning > 2 midnights and should be moved to inpatient because: Altered mental status, Ongoing diagnostic testing needed not appropriate for outpatient work up, IV treatments appropriate due to intensity of illness or inability to take PO, and Inpatient level of care appropriate due to severity of illness  Dispo:  Patient From: Home  Planned Disposition: Home  Medically stable for discharge: No      Consultants:  Neurology Neurosurgery   Sch Meds:  Scheduled Meds:  aspirin EC  81 mg Oral Daily   dexamethasone  2 mg Intravenous Q8H   Followed by   Derrill Memo ON 07/08/2021] dexamethasone  2 mg Intravenous Q12H   Followed by   Derrill Memo ON 07/11/2021] dexamethasone  1 mg Intravenous Q12H   Followed by   Derrill Memo ON 07/14/2021] dexamethasone  1 mg Intravenous Daily   enoxaparin (LOVENOX) injection  40 mg Subcutaneous Q24H   ezetimibe  10 mg Oral Daily   insulin aspart  0-9 Units Subcutaneous Q4H   levothyroxine  44 mcg Intravenous Daily    mometasone-formoterol  2 puff Inhalation BID   montelukast  10 mg Oral QHS   multivitamin with minerals  1 tablet Oral QPM   pantoprazole  40 mg Intravenous Daily   simvastatin  40 mg Oral QHS   vitamin B-12  500 mcg Oral QPM   Continuous Infusions:  sodium chloride 50 mL/hr at 07/07/21 0600   acetaminophen     levETIRAcetam 500 mg (07/07/21 0529)   PRN Meds:.acetaminophen, albuterol, labetalol, melatonin, ondansetron (ZOFRAN) IV, perflutren lipid microspheres (DEFINITY) IV suspension, polyethylene glycol, sodium chloride flush  Antimicrobials: Anti-infectives (From admission, onward)    None        I have personally reviewed the following labs and images: CBC: Recent Labs  Lab 07/06/21 1221 07/07/21 1004  WBC 18.2* 12.7*  NEUTROABS 14.8*  --   HGB 13.1 12.7*  HCT 38.6* 36.2*  MCV 86.4 83.8  PLT 306 264   BMP &GFR Recent Labs  Lab 07/06/21 1221 07/07/21 1004  NA 129*  131*  K 3.7 4.1  CL 96* 96*  CO2 25 28  GLUCOSE 161* 103*  BUN 30* 26*  CREATININE 0.77 0.74  CALCIUM 8.3* 7.9*  MG  --  2.0  PHOS  --  4.5   Estimated Creatinine Clearance: 96.1 mL/min (by C-G formula based on SCr of 0.74 mg/dL). Liver & Pancreas: Recent Labs  Lab 07/06/21 1221 07/07/21 1004  AST 28  --   ALT 84*  --   ALKPHOS 46  --   BILITOT 0.7  --   PROT 5.8*  --   ALBUMIN 3.1* 2.7*   No results for input(s): LIPASE, AMYLASE in the last 168 hours. No results for input(s): AMMONIA in the last 168 hours. Diabetic: Recent Labs    07/07/21 0436  HGBA1C 6.2*   Recent Labs  Lab 07/06/21 2136 07/07/21 0619  GLUCAP 115* 84   Cardiac Enzymes: No results for input(s): CKTOTAL, CKMB, CKMBINDEX, TROPONINI in the last 168 hours. No results for input(s): PROBNP in the last 8760 hours. Coagulation Profile: No results for input(s): INR, PROTIME in the last 168 hours. Thyroid Function Tests: Recent Labs    07/06/21 1221 07/06/21 1427  TSH 0.057*  --   FREET4  --  1.43*   T3FREE  --  1.7*   Lipid Profile: Recent Labs    07/07/21 0436  CHOL 108  HDL 48  LDLCALC 53  TRIG 35  CHOLHDL 2.3   Anemia Panel: No results for input(s): VITAMINB12, FOLATE, FERRITIN, TIBC, IRON, RETICCTPCT in the last 72 hours. Urine analysis:    Component Value Date/Time   COLORURINE YELLOW 01/28/2020 1051   APPEARANCEUR CLOUDY (A) 01/28/2020 1051   LABSPEC 1.025 01/28/2020 1051   PHURINE 5.5 01/28/2020 1051   GLUCOSEU NEGATIVE 01/28/2020 1051   HGBUR NEGATIVE 01/28/2020 1051   BILIRUBINUR SMALL (A) 01/28/2020 1051   BILIRUBINUR negative 01/28/2020 1044   KETONESUR TRACE (A) 01/28/2020 1051   PROTEINUR Positive (A) 01/28/2020 1044   UROBILINOGEN 0.2 01/28/2020 1051   UROBILINOGEN negative (A) 01/28/2020 1044   NITRITE NEGATIVE 01/28/2020 1051   NITRITE negative 01/28/2020 1044   LEUKOCYTESUR NEGATIVE 01/28/2020 1051   LEUKOCYTESUR Negative 01/28/2020 1044   Sepsis Labs: Invalid input(s): PROCALCITONIN, [place]  Microbiology: Recent Results (from the past 240 hour(s))  MRSA Next Gen by PCR, Nasal     Status: None   Collection Time: 06/28/21  5:18 PM   Specimen: Nasal Mucosa; Nasal Swab  Result Value Ref Range Status   MRSA by PCR Next Gen NOT DETECTED NOT DETECTED Final    Comment: (NOTE) The GeneXpert MRSA Assay (FDA approved for NASAL specimens only), is one component of a comprehensive MRSA colonization surveillance program. It is not intended to diagnose MRSA infection nor to guide or monitor treatment for MRSA infections. Test performance is not FDA approved in patients less than 66 years old. Performed at [facility], [place] [address] [place], [place] [address]   SARS CORONAVIRUS 2 (TAT 6-24 HRS) Nasopharyngeal Nasopharyngeal Swab     Status: None   Collection Time: 07/06/21  3:28 PM   Specimen: Nasopharyngeal Swab  Result Value Ref Range Status   SARS Coronavirus 2 NEGATIVE NEGATIVE Final    Comment: (NOTE) SARS-CoV-2 target nucleic  acids are NOT DETECTED.  The SARS-CoV-2 RNA is generally detectable in upper and lower respiratory specimens during the acute phase of infection. Negative results do not preclude SARS-CoV-2 infection, do not rule out co-infections with other pathogens, and should not  be used as the sole basis for treatment or other patient management decisions. Negative results must be combined with clinical observations, patient history, and epidemiological information. The expected result is Negative.  Fact Sheet for Patients: SugarRoll.be  Fact Sheet for Healthcare Providers: https://www.woods-mathews.com/  This test is not yet approved or cleared by the [place] FDA and  has been authorized for detection and/or diagnosis of SARS-CoV-2 by FDA under an Emergency Use Authorization (EUA). This EUA will remain  in effect (meaning this test can be used) for the duration of the COVID-19 declaration under Se ction 564(b)(1) of the Act, 21 U.S.C. section 360bbb-3(b)(1), unless the authorization is terminated or revoked sooner.  Performed at [facility], [place] [address], [place], [place] [address]     Radiology Studies: DG Chest 2 View  Result Date: 07/06/2021 CLINICAL DATA:  Altered mental status, confusion for 24 hours EXAM: CHEST - 2 VIEW COMPARISON:  10/20/2013 FINDINGS: Enlargement of cardiac silhouette. Mediastinal contours and pulmonary vascularity normal. Lungs clear. No infiltrate, pleural effusion, or pneumothorax. Bones demineralized. IMPRESSION: No acute abnormalities. Enlargement of cardiac silhouette. Electronically Signed   By: Lavonia Dana M.D.   On: 07/06/2021 13:06   CT HEAD WO CONTRAST  Result Date: 07/06/2021 CLINICAL DATA:  Altered mental status. EXAM: CT HEAD WITHOUT CONTRAST TECHNIQUE: Contiguous axial images were obtained from the base of the skull through the vertex without intravenous contrast. COMPARISON:  06/29/2021. FINDINGS:  Brain: New hypodensity and loss of gray-white differentiation in the anterior left temporal lobe, most likely an acute or subacute infarct. Interval resolution of the small volume of pneumocephalus seen on the prior. Redemonstrated hyperdense right caudate mass which appears similar in size in comparison to recent CT head from 06/29/2021. Linear hypodensity adjacent to the mass likely is a sequela recent biopsy. Mild regional mass effect is also similar. No convincing acute/interval hemorrhage. Heterogeneous hyperdensity in the basal ganglia likely reflects tumor when comparing to prior MRI. Similar small hypodensity in the right pons. Similar position of a right parietal approach ventriculostomy catheter which terminates in the atrium right lateral ventricle. Low position of additional midline occipital approach catheter which terminates between the frontal horns of the lateral ventricles. No evidence of hydrocephalus. Old ventriculostomy catheter tract is noted in the right frontal lobe. Vascular: Calcific atherosclerosis. No hyperdense vessel identified. Skull: Right frontal craniotomy. Right-sided burr hole. Suboccipital craniectomy. Sinuses/Orbits: Mild paranasal sinus mucosal thickening. No acute orbital findings. Other: No mastoid effusions. IMPRESSION: 1. New hypodensity and loss of gray-white differentiation in the anterior left temporal lobe, most likely an acute or subacute infarct. Recommend MRI with contrast to confirm and to exclude other etiologies. 2. In comparison to recent CT head from June 29, 2021, no substantial change in the masslike densities in the right caudate and basal ganglia, further evaluated on prior MRI. 3. 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 Electronically Signed   By: Margaretha Sheffield MD   On: 07/06/2021 14:26   MR Brain W and Wo Contrast  Result Date: 07/06/2021 CLINICAL DATA:  Neuro deficit, acute stroke suspected. EXAM: MRI HEAD WITHOUT AND WITH  CONTRAST TECHNIQUE: Multiplanar, multiecho pulse sequences of the brain and surrounding structures were obtained without and with intravenous contrast. CONTRAST:  62mL GADAVIST GADOBUTROL 1 MMOL/ML IV SOLN COMPARISON:  MRI May 24, 2021. FINDINGS: Brain: The hypoattenuation in the left temporal lobe seen on same day CT head correlates with nonenhancing restricted diffusion, most consistent with acute infarct.Associated edema and local sulcal effacement. Increased  size of expansile T2/FLAIR hyperintensity within the right caudate with increasingly heterogeneous enhancement, now measuring 2.9 x 2.0 cm on series 9, image 14, previously 2.5 x 1.5 cm when remeasured. Enhancement abuts and is inseparable from the frontal horn of the right lateral ventricle, separate. Heterogeneous enhancement also extends into adjacent frontal white matter superiorly. Additionally, mildly increased size of the expansile, peripherally enhancing lesion in the posterior limb of the right internal capsule, now measuring approximately 1.5 cm on series 9, image 12, previously 1.2 cm when remeasured. Both these areas demonstrate restricted diffusion, suggestive of hypercellularity. The previously seen enhancement in the right pons has resolved with small focus of T2 hyperintensity in this region. Similar chronic hemorrhagic lacunar infarcts in bilateral basal ganglia and chronic microhemorrhages in the thalami and elsewhere in the cerebral and cerebellum, as well as the brainstem. Right parietal approach ventriculostomy catheter is again noted a terminating in the atrium of the right lateral ventricle. Additionally, there is a midline ventriculostomy catheter which terminates in similar position. No hydrocephalus. Remote ventriculostomy catheter tract in the right frontal lobe, similar. No extra-axial fluid collection.  No midline shift. Vascular: Major arterial flow voids are maintained at the skull base. Small vertebrobasilar system, similar to  prior. Skull and upper cervical spine: Normal marrow signal. Sinuses/Orbits: Mild sinus mucosal thickening.  Unremarkable orbits. IMPRESSION: 1. The hypoattenuation in the left temporal lobe seen on same day CT head correlates with nonenhancing restricted diffusion, compatible with acute infarct. Associated edema and local sulcal effacement. Additional small nonenhancing focus of restricted diffusion in the right paramidline pons likely represents an additional acute infarct 2. Increased size/bulk of expansile, heterogeneously enhancing masses in the right caudate and smaller lesion in the adjacent posterior limb of the internal capsule, as described above. 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 Atypical infection is considered less likely. Recommend correlation with pathology results of the recent biopsy. 3. The previously seen area of enhancement in the right pons has resolved with focal T2 hyperintensity in this region. Given the apparent resolution of enhancement, this area may have previously represented an enhancing subacute infarct, but this warrants continued attention on follow-up. Findings discussed with Dr. Vanita Panda at 7:18 p.m. via telephone. Electronically Signed   By: Margaretha Sheffield MD   On: 07/06/2021 19:38   EEG adult  Result Date: 07/07/2021 Lora Havens, MD     07/07/2021  8:47 AM Patient Name: Adam Sullivan MRN: [id number] Epilepsy Attending: Lora Havens Referring Physician/Provider: Dr Donnetta Simpers Date: 79/2022 Duration: 20.29 mins Patient history:  3M with brain biopsy of lesion concerning for malignancy a week ago who presents with intermittent mild aphasia/confusion. EEG to evaluate for seizure. Level of alertness: Awake AEDs during EEG study: LEV Technical aspects: This EEG study was done with scalp electrodes positioned according to the 10-20 International system of  electrode placement. Electrical activity was acquired at a sampling rate of 500Hz  and reviewed with a high frequency filter of 70Hz  and a low frequency filter of 1Hz . EEG data were recorded continuously and digitally stored. Description: The posterior dominant rhythm consists of 8Hz  activity of moderate voltage (25-35 uV) seen predominantly in posterior head regions, symmetric and reactive to eye opening and eye closing. EEG showed continuous generalized left > right frontal 3 to 6 Hz theta-delta slowing.  Hyperventilation and photic stimulation were not performed.   ABNORMALITY - Continuous slow,left>right frontal region IMPRESSION: This study is  suggestive of cortical dysfunction arising from left> right frontal region likely secondary to underlying structural abnormality. No seizures or definite epileptiform discharges were seen throughout the recording. If suspicion for ictal-interictal activity remains a concern, a prolonged study can be considered. Priyanka O Yadav   VAS [place] LOWER EXTREMITY VENOUS (DVT)  Result Date: 07/07/2021  Lower Venous DVT Study Patient Name:  Adam Sullivan  Date of Exam:   07/07/2021 Medical Rec #: [id number]     Accession #:    [id number] Date of Birth: 1956-01-19      Patient Gender: M Patient Age:   65Y Exam Location:  [facility] Procedure:      VAS [place] LOWER EXTREMITY VENOUS (DVT) Referring Phys: [id number] Rosalin Hawking --------------------------------------------------------------------------------  Indications: Stroke.  Comparison Study: no prior Performing Technologist: Archie Patten RVS  Examination Guidelines: A complete evaluation includes B-mode imaging, spectral Doppler, color Doppler, and power Doppler as needed of all accessible portions of each vessel. Bilateral testing is considered an integral part of a complete examination. Limited examinations for reoccurring indications may be performed as noted. The reflux portion of the exam is performed with the patient in  reverse Trendelenburg.  +---------+---------------+---------+-----------+----------+--------------+ RIGHT    CompressibilityPhasicitySpontaneityPropertiesThrombus Aging +---------+---------------+---------+-----------+----------+--------------+ CFV      Full           Yes      Yes                                 +---------+---------------+---------+-----------+----------+--------------+ SFJ      Full                                                        +---------+---------------+---------+-----------+----------+--------------+ FV Prox  Full                                                        +---------+---------------+---------+-----------+----------+--------------+ FV Mid   Full                                                        +---------+---------------+---------+-----------+----------+--------------+ FV DistalFull                                                        +---------+---------------+---------+-----------+----------+--------------+ PFV      Full                                                        +---------+---------------+---------+-----------+----------+--------------+ POP      Full           Yes      Yes                                 +---------+---------------+---------+-----------+----------+--------------+  PTV      Full                                                        +---------+---------------+---------+-----------+----------+--------------+ PERO     Full                                                        +---------+---------------+---------+-----------+----------+--------------+   +---------+---------------+---------+-----------+----------+--------------+ LEFT     CompressibilityPhasicitySpontaneityPropertiesThrombus Aging +---------+---------------+---------+-----------+----------+--------------+ CFV      Full           Yes      Yes                                  +---------+---------------+---------+-----------+----------+--------------+ SFJ      Full                                                        +---------+---------------+---------+-----------+----------+--------------+ FV Prox  Full                                                        +---------+---------------+---------+-----------+----------+--------------+ FV Mid   Full                                                        +---------+---------------+---------+-----------+----------+--------------+ FV DistalFull                                                        +---------+---------------+---------+-----------+----------+--------------+ PFV      Full                                                        +---------+---------------+---------+-----------+----------+--------------+ POP      Full           Yes      Yes                                 +---------+---------------+---------+-----------+----------+--------------+ PTV      Full                                                        +---------+---------------+---------+-----------+----------+--------------+  PERO     Full                                                        +---------+---------------+---------+-----------+----------+--------------+     Summary: BILATERAL: - No evidence of deep vein thrombosis seen in the lower extremities, bilaterally. -No evidence of popliteal cyst, bilaterally.   *See table(s) above for measurements and observations.    Preliminary       Breckyn Ticas T. [place]  If 7PM-7AM, please contact night-coverage [email or web address] 07/07/2021, 11:17 AM

## 2021-07-07 NOTE — Progress Notes (Signed)
SLP Cancellation Note  Patient Details Name: Adam Sullivan MRN: [id number] DOB: 1956-11-16   Cancelled treatment:       Reason Eval/Treat Not Completed: Patient at procedure or test/unavailable   Larell Baney Meryl 07/07/2021, 10:14 AM

## 2021-07-07 NOTE — Plan of Care (Signed)
  Problem: Education: Goal: Knowledge of disease or condition will improve Outcome: Progressing Goal: Knowledge of secondary prevention will improve Outcome: Progressing   

## 2021-07-07 NOTE — Progress Notes (Signed)
Lower extremity venous has been completed.   Preliminary results in CV Proc.   Abram Sander 07/07/2021 10:29 AM

## 2021-07-07 NOTE — Evaluation (Signed)
Clinical/Bedside Swallow Evaluation Patient Details  Name: Adam Sullivan MRN: [id number] Date of Birth: [birth year]  Today's Date: 07/07/2021 Time: SLP Start Time (ACUTE ONLY): 1440 SLP Stop Time (ACUTE ONLY): 4098 SLP Time Calculation (min) (ACUTE ONLY): 19 min  Past Medical History:  Past Medical History:  Diagnosis Date   Allergy    Anxiety    Arthritis    Asthma    severe, Dr. Linna Darner   Blood clots in brain    history of    Brain tumor ([place]) 12/31/1991   Depression    situational   Dyspnea    GERD (gastroesophageal reflux disease)    severe   Headache(784.0)    Hyperlipidemia    Hypothyroidism    Orchalgia    Pneumonia    hx of   Pre-diabetes    Prostate cancer ([place]) 07/01/2013   gleason 6   Thyroid disease    hypothyroidism   Past Surgical History:  Past Surgical History:  Procedure Laterality Date   APPLICATION OF CRANIAL NAVIGATION Right 06/28/2021   Procedure: APPLICATION OF CRANIAL NAVIGATION;  Surgeon: Vallarie Mare, MD;  Location: [facility];  Service: Neurosurgery;  Laterality: Right;   brain shunt  12/31/1991   put in, removed, put in again   [place] Right 06/28/2021   Procedure: STEREOTACTIC FRONTAL BIOPSY OF CAUDATE LESION;  Surgeon: Vallarie Mare, MD;  Location: [facility];  Service: Neurosurgery;  Laterality: Right;   LYMPHADENECTOMY Bilateral 10/22/2013   Procedure: LYMPHADENECTOMY;  Surgeon: Alexis Frock, MD;  Location: [facility];  Service: Urology;  Laterality: Bilateral;   removal brain tumor  12/31/1991   pineal tumor, benign   removed blood clot     X 2   ROBOT ASSISTED LAPAROSCOPIC RADICAL PROSTATECTOMY N/A 10/22/2013   Procedure: ROBOTIC ASSISTED LAPAROSCOPIC RADICAL PROSTATECTOMY   PRE- PERITONEAL APPROACH;  Surgeon: Alexis Frock, MD;  Location: [facility];  Service: Urology;  Laterality: N/A;  3.5 HRS    HPI:  65 y/o presented to ED on 7/8 with complaints of confusion and  "word salad". 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 Recently admitted 6/29-7/1 for progressive neurologic decline and was found to have multiple progressive enhancing lesions in his brain. CT of head revealed progression of R caudate and basal ganglia tumors. Metastatic work-up was negative. He underwent elective biopsy of the caudate head lesion 6/30. 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 He is communicative, alert and eager for po's. Cough is strong and oral-motor impairments were not appreciated. He consumed 2-3 consecutive straw sips thin, puree and solid textures. Vocal quality remained strong and clear. Regular texture, thin liquids, straw and pills with thin recommended. No further ST needed for swallow. Speech-language-cognitive evaluation pending. SLP Visit Diagnosis: Dysphagia, unspecified (R13.10)    Aspiration Risk  Mild aspiration risk    Diet Recommendation Regular;Thin liquid   Liquid Administration via: Straw;Cup Medication Administration: Whole meds with liquid Supervision: Patient able to self feed Compensations: Slow rate;Small sips/bites Postural Changes: Seated upright at 90 degrees    Other  Recommendations Oral Care Recommendations: Oral care BID   Follow up Recommendations None (for swallow)      Frequency and Duration            Prognosis        Swallow Study   General Date of Onset: 07/06/21 HPI: 65 y/o presented to ED on 7/8 with complaints of confusion and "word salad". 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 Recently admitted 6/29-7/1 for progressive neurologic decline and was found to have multiple progressive enhancing lesions in his brain. CT of head revealed progression of R caudate and basal ganglia tumors. Metastatic work-up was negative. He underwent elective biopsy of the caudate head lesion 6/30. 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 Type of Study: Bedside Swallow Evaluation Previous Swallow Assessment: none noted in chart Diet Prior to this Study: NPO Temperature Spikes Noted: No Respiratory Status: Room air History of Recent Intubation: No Behavior/Cognition: Alert;Cooperative;Pleasant mood Oral Cavity Assessment: Within Functional Limits Oral Care Completed by SLP: No Oral Cavity - Dentition: Other (Comment) (missing several) Vision: Functional for self-feeding Self-Feeding Abilities: Able to feed self Patient Positioning: Other (comment) (edge of bed) Baseline Vocal Quality: Normal Volitional Cough: Strong Volitional Swallow: Able to elicit    Oral/Motor/Sensory Function Overall Oral Motor/Sensory Function: Within functional limits   Ice Chips Ice chips: Not tested   Thin Liquid Thin Liquid: Within functional limits Presentation: Cup;Straw    Nectar Thick Nectar Thick Liquid: Not tested   Honey Thick Honey Thick Liquid: Not tested   Puree Puree: Within functional limits   Solid     Solid: Within functional limits      Houston Siren 07/07/2021,3:54 PM  Orbie Pyo Colvin Caroli.Ed Risk analyst [phone] Office [phone]

## 2021-07-07 NOTE — Procedures (Signed)
Patient Name: Adam Sullivan  MRN: [id number]  Epilepsy Attending: Lora Havens  Referring Physician/Provider: Dr Donnetta Simpers Date: 79/2022 Duration: 20.29 mins  Patient history:  30M with brain biopsy of lesion concerning for malignancy a week ago who presents with intermittent mild aphasia/confusion. EEG to evaluate for seizure.   Level of alertness: Awake  AEDs during EEG study: LEV  Technical aspects: This EEG study was done with scalp electrodes positioned according to the 10-20 International system of electrode placement. Electrical activity was acquired at a sampling rate of 500Hz  and reviewed with a high frequency filter of 70Hz  and a low frequency filter of 1Hz . EEG data were recorded continuously and digitally stored.   Description: The posterior dominant rhythm consists of 8Hz  activity of moderate voltage (25-35 uV) seen predominantly in posterior head regions, symmetric and reactive to eye opening and eye closing. EEG showed continuous generalized left > right frontal 3 to 6 Hz theta-delta slowing.  Hyperventilation and photic stimulation were not performed.     ABNORMALITY - Continuous slow,left>right frontal region   IMPRESSION: This study is suggestive of cortical dysfunction arising from left> right frontal region likely secondary to underlying structural abnormality. No seizures or definite epileptiform discharges were seen throughout the recording.  If suspicion for ictal-interictal activity remains a concern, a prolonged study can be considered.   Xochil Shanker Barbra Sarks

## 2021-07-07 NOTE — Evaluation (Signed)
Physical Therapy Evaluation Patient Details Name: Adam Sullivan MRN: [id number] DOB: January 22, 1956 Today's Date: 07/07/2021   History of Present Illness  65 y/o presented to ED on 7/8 with complaints of confusion and "word salad". 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 Recently admitted 6/29-7/1 for progressive neurologic decline and was found to have multiple progressive enhancing lesions in his brain. CT of head revealed progression of R caudate and basal ganglia tumors. Metastatic work-up was negative. He underwent elective biopsy of the caudate head lesion 6/30. 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  Clinical Impression  PTA, patient lives with spouse and independent with mobility but wife assists him at times. Patient currently functioning at [facility] level for mobility with no AD. Patient requesting to use restroom to urinate, however upon standing at commode, patient had bowel movement and reports unaware of need to go. Wife reports recently having decreased sensation in perineal area. Patient presents with impaired cognition, decreased activity tolerance, impaired balance, generalized weakness. Patient will benefit from skilled PT services during acute stay to address listed deficits. Wife reports patient has planned OPPT appointment from previous admission which remains appropriate.     Follow Up Recommendations Outpatient PT;Supervision for mobility/OOB (continue planned appointment for OPPT)    Equipment Recommendations  None recommended by PT    Recommendations for Other Services       Precautions / Restrictions Precautions Precautions: Fall Precaution Comments: decreased  sensation with perineal area (incontinent and unaware) Restrictions Weight Bearing Restrictions: No      Mobility  Bed Mobility Overal bed mobility: Needs Assistance Bed Mobility: Supine to Sit;Sit to Supine     Supine to sit: Min assist Sit to supine: Min guard   General bed mobility comments: minA for repositioning hips towards EOB    Transfers Overall transfer level: Needs assistance Equipment used: None Transfers: Sit to/from Stand Sit to Stand: Min assist         General transfer comment: minA to power up into standing and steady  Ambulation/Gait Ambulation/Gait assistance: Min assist Gait Distance (Feet): 10 Feet (x10') Assistive device: None Gait Pattern/deviations: Decreased stance time - left;Decreased dorsiflexion - left;Decreased step length - left;Step-through pattern;Decreased weight shift to left;Trunk flexed Gait velocity: decreased   General Gait Details: ambulates with slow speed and drifting L/R. MinA for balance due to unsteadiness  Stairs            Wheelchair Mobility    Modified Rankin (Stroke Patients Only) Modified Rankin (Stroke Patients Only) Pre-Morbid Rankin Score: Moderately severe disability Modified Rankin: Moderately severe disability     Balance Overall balance assessment: Needs assistance Sitting-balance support: No upper extremity supported;Feet supported Sitting balance-Leahy Scale: Fair     Standing balance support: No upper extremity supported;During functional activity Standing balance-Leahy Scale: Fair                               Pertinent Vitals/Pain Pain Assessment: No/denies pain    Home Living Family/patient expects to be discharged to:: Private residence Living Arrangements: Spouse/significant other Available Help at Discharge: Family;Available 24 hours/day Type of Home: House Home Access: Stairs to enter Entrance Stairs-Rails: Right (ascending) Entrance Stairs-Number of Steps:  4 Home Layout: One level Home Equipment: Transport chair      Prior Function Level of Independence: Independent         Comments: Uses walking stick on uneven surfaces. Pt and wife are retired. Pt worked as a Banker. Wife drives. Wife assists in donning of pants.     Hand Dominance        Extremity/Trunk Assessment   Upper Extremity Assessment Upper Extremity Assessment: Defer to OT evaluation    Lower Extremity Assessment Lower Extremity Assessment: Generalized weakness    Cervical / Trunk Assessment Cervical / Trunk Assessment: Other exceptions Cervical / Trunk Exceptions: decreased sensation to perineal area (incontinent and unaware)  Communication   Communication: Expressive difficulties (mumbles per wife)  Cognition Arousal/Alertness: Awake/alert Behavior During Therapy: Flat affect Overall Cognitive Status: History of cognitive impairments - at baseline Area of Impairment: Attention;Memory;Following commands;Safety/judgement;Awareness;Problem solving                   Current Attention Level: Sustained Memory: Decreased short-term memory Following Commands: Follows one step commands inconsistently;Follows one step commands with increased time Safety/Judgement: Decreased awareness of safety;Decreased awareness of deficits Awareness: Emergent Problem Solving: Slow processing General Comments: history of memory deficits. Increased time to process and follow commands      General Comments      Exercises     Assessment/Plan    PT Assessment Patient needs continued PT services  PT Problem List Decreased strength;Decreased range of motion;Decreased activity tolerance;Decreased balance;Decreased mobility;Decreased coordination;Decreased cognition       PT Treatment Interventions DME instruction;Gait training;Stair training;Functional mobility training;Therapeutic activities;Therapeutic exercise;Balance training;Neuromuscular  re-education;Cognitive remediation;Patient/family education    PT Goals (Current goals can be found in the Care Plan section)  Acute Rehab PT Goals Patient Stated Goal: to get better PT Goal Formulation: With patient/family Time For Goal Achievement: 07/21/21 Potential to Achieve Goals: Good    Frequency Min 4X/week   Barriers to discharge        Co-evaluation               AM-PAC PT "6 Clicks" Mobility  Outcome Measure Help needed turning from your back to your side while in a flat bed without using bedrails?: A Little Help needed moving from lying on your back to sitting on the side of a flat bed without using bedrails?: A Little Help needed moving to and from a bed to a chair (including a wheelchair)?: A Little Help needed standing up from a chair using your arms (e.g., wheelchair or bedside chair)?: A Little Help needed to walk in hospital room?: A Little Help needed climbing 3-5 steps with a railing? : A Little 6 Click Score: 18    End of Session Equipment Utilized During Treatment: Gait belt Activity Tolerance: Patient tolerated treatment well Patient left: in bed;with call bell/phone within reach;with bed alarm set Nurse Communication: Mobility status PT Visit Diagnosis: Unsteadiness on feet (R26.81);Other abnormalities of gait and mobility (R26.89);Muscle weakness (generalized) (M62.81);Difficulty in walking, not elsewhere classified (R26.2);Other symptoms and signs involving the nervous system (R29.898);Hemiplegia and hemiparesis Hemiplegia - Right/Left: Left Hemiplegia - dominant/non-dominant: Non-dominant    Time: 2423-5361 PT Time Calculation (min) (ACUTE ONLY): 28 min   Charges:   PT Evaluation $PT Eval Moderate Complexity: 1 Mod          Lequisha Cammack A. Gilford Rile PT, DPT Acute Rehabilitation Services Pager [phone] Office [phone]   Linna Hoff 07/07/2021, 11:09 AM

## 2021-07-07 NOTE — Progress Notes (Signed)
NEUROSURGERY PROGRESS NOTE  Discussed plan of care with wife. Patient was in CT. He is doing ok though she states. Some very mild headaches and a little sleepy today. Ok to have MRI, his shunt is non-programmable and he has had MRI's before.   Temp:  [97.7 F (36.5 C)-98.4 F (36.9 C)] 98.2 F (36.8 C) (07/09 0750) Pulse Rate:  [43-65] 52 (07/09 0800) Resp:  [14-18] 14 (07/09 0750) BP: (143-183)/(61-81) 143/61 (07/09 0750) SpO2:  [98 %-100 %] 98 % (07/09 0750)    Eleonore Chiquito, NP 07/07/2021 10:30 AM

## 2021-07-08 DIAGNOSIS — I10 Essential (primary) hypertension: Secondary | ICD-10-CM

## 2021-07-08 DIAGNOSIS — I679 Cerebrovascular disease, unspecified: Secondary | ICD-10-CM

## 2021-07-08 LAB — CBC
HCT: 35.1 % — ABNORMAL LOW (ref 39.0–52.0)
Hemoglobin: 12.2 g/dL — ABNORMAL LOW (ref 13.0–17.0)
MCH: 29.4 pg (ref 26.0–34.0)
MCHC: 34.8 g/dL (ref 30.0–36.0)
MCV: 84.6 fL (ref 80.0–100.0)
Platelets: 254 10*3/uL (ref 150–400)
RBC: 4.15 MIL/uL — ABNORMAL LOW (ref 4.22–5.81)
RDW: 14.8 % (ref 11.5–15.5)
WBC: 12.8 10*3/uL — ABNORMAL HIGH (ref 4.0–10.5)
nRBC: 0 % (ref 0.0–0.2)

## 2021-07-08 LAB — AMMONIA: Ammonia: 12 umol/L (ref 9–35)

## 2021-07-08 LAB — RENAL FUNCTION PANEL
Albumin: 2.6 g/dL — ABNORMAL LOW (ref 3.5–5.0)
Anion gap: 6 (ref 5–15)
BUN: 27 mg/dL — ABNORMAL HIGH (ref 8–23)
CO2: 28 mmol/L (ref 22–32)
Calcium: 8.2 mg/dL — ABNORMAL LOW (ref 8.9–10.3)
Chloride: 97 mmol/L — ABNORMAL LOW (ref 98–111)
Creatinine, Ser: 0.88 mg/dL (ref 0.61–1.24)
GFR, Estimated: >60 mL/min (ref >60)
Glucose, Bld: 149 mg/dL — ABNORMAL HIGH (ref 70–99)
Phosphorus: 4.2 mg/dL (ref 2.5–4.6)
Potassium: 4.2 mmol/L (ref 3.5–5.1)
Sodium: 131 mmol/L — ABNORMAL LOW (ref 135–145)

## 2021-07-08 LAB — GLUCOSE, CAPILLARY
Glucose-Capillary: 141 mg/dL — ABNORMAL HIGH (ref 70–99)
Glucose-Capillary: 153 mg/dL — ABNORMAL HIGH (ref 70–99)
Glucose-Capillary: 123 mg/dL — ABNORMAL HIGH (ref 70–99)
Glucose-Capillary: 104 mg/dL — ABNORMAL HIGH (ref 70–99)

## 2021-07-08 LAB — CK: Total CK: 12 U/L — ABNORMAL LOW (ref 49–397)

## 2021-07-08 LAB — RPR: RPR Ser Ql: NONREACTIVE

## 2021-07-08 LAB — MAGNESIUM: Magnesium: 2.2 mg/dL (ref 1.7–2.4)

## 2021-07-08 MED ORDER — CLOPIDOGREL BISULFATE 75 MG PO TABS: 75.0000 mg | ORAL_TABLET | Freq: Every day | ORAL | 0 refills | Status: AC

## 2021-07-08 MED ORDER — ALBUTEROL SULFATE HFA 108 (90 BASE) MCG/ACT IN AERS: 2.0000 | INHALATION_SPRAY | Freq: Four times a day (QID) | RESPIRATORY_TRACT | Status: AC | PRN

## 2021-07-08 MED ORDER — SYNTHROID 75 MCG PO TABS: 75.0000 ug | ORAL_TABLET | Freq: Every day | ORAL | 1 refills | Status: AC

## 2021-07-08 MED ORDER — ASPIRIN 81 MG PO TBEC
81.0000 mg | DELAYED_RELEASE_TABLET | Freq: Every day | ORAL | 1 refills | Status: AC
Start: 2021-07-08 — End: ?

## 2021-07-08 MED ORDER — METFORMIN HCL 500 MG PO TABS: ORAL_TABLET | ORAL | 0 refills | Status: AC

## 2021-07-08 MED ORDER — DEXAMETHASONE 2 MG PO TABS: ORAL_TABLET | ORAL | 0 refills | Status: AC

## 2021-07-08 MED ORDER — HYDROCHLOROTHIAZIDE 25 MG PO TABS: 25.0000 mg | ORAL_TABLET | Freq: Every day | ORAL | 1 refills | Status: AC

## 2021-07-08 NOTE — Progress Notes (Signed)
Pt dc home with wife. No needs were expressed. DC instructions reviewed and understood. DC papers placed in a packet.

## 2021-07-08 NOTE — Progress Notes (Addendum)
STROKE TEAM PROGRESS NOTE   ATTENDING NOTE: I reviewed above note and agree with the assessment and plan. Pt was seen and examined.   Wife at bedside, patient sitting in chair, dressed up and in preparation for being discharged.  Speech much improved.  Patient has appointment with Dr. Marcello Moores tomorrow.  I asked the wife to clarify with Dr. Marcello Moores tomorrow regarding Keppra duration.  Continue Keppra for now on discharge.  Given severe intracranial stenosis, recommend DAPT aspirin 81 and Plavix 75 for 3 months and then aspirin alone.  Patient had DAPT dose yesterday and so far tolerating well.  Given his " bleeding disorder" in the past, recommend bleeding precautions.  Wife knows if any new bleeding or stroke symptoms, will bring him back to ER.  Continue Vytorin. also discussed with wife regarding BP goal 130-150 given multifocal severe intracranial vascular stenosis.  Neurology will sign off. Please call with questions. Pt will follow up with stroke clinic Dr. Leonie Man at [facility] in about 4 weeks. Thanks for the consult.   Rosalin Hawking, MD PhD Stroke Neurology 07/08/2021 3:39 PM    INTERVAL HISTORY His wife is at the bedside.  She does most of the talking during this evaluation.   Vitals:   07/07/21 2015 07/07/21 2023 07/08/21 0825 07/08/21 0830  BP: 140/67  (!) 161/87   Pulse: 66  (!) 51   Resp: 18  17   Temp: 98.7 F (37.1 C)  98.3 F (36.8 C)   TempSrc: Oral  Oral   SpO2: 97% 96% 98% 98%   CBC:  Recent Labs  Lab 07/06/21 1221 07/07/21 1004 07/08/21 0314  WBC 18.2* 12.7* 12.8*  NEUTROABS 14.8*  --   --   HGB 13.1 12.7* 12.2*  HCT 38.6* 36.2* 35.1*  MCV 86.4 83.8 84.6  PLT 306 264 831    Basic Metabolic Panel:  Recent Labs  Lab 07/07/21 1004 07/08/21 0314  NA 131* 131*  K 4.1 4.2  CL 96* 97*  CO2 28 28  GLUCOSE 103* 149*  BUN 26* 27*  CREATININE 0.74 0.88  CALCIUM 7.9* 8.2*  MG 2.0 2.2  PHOS 4.5 4.2     Lipid Panel:  Recent Labs  Lab 07/07/21 0436  CHOL 108   TRIG 35  HDL 48  CHOLHDL 2.3  VLDL 7  LDLCALC 53     HgbA1c:  Recent Labs  Lab 07/07/21 0436  HGBA1C 6.2*    Urine Drug Screen: No results for input(s): LABOPIA, COCAINSCRNUR, LABBENZ, AMPHETMU, THCU, LABBARB in the last 168 hours.  Alcohol Level No results for input(s): ETH in the last 168 hours.  IMAGING past 24 hours CT ANGIO HEAD NECK W WO CM  Result Date: 07/07/2021 CLINICAL DATA:  Stroke follow-up. EXAM: CT ANGIOGRAPHY HEAD AND NECK TECHNIQUE: Multidetector CT imaging of the head and neck was performed using the standard protocol during bolus administration of intravenous contrast. Multiplanar CT image reconstructions and MIPs were obtained to evaluate the vascular anatomy. Carotid stenosis measurements (when applicable) are obtained utilizing NASCET criteria, using the distal internal carotid diameter as the denominator. CONTRAST:  164mL OMNIPAQUE IOHEXOL 350 MG/ML SOLN COMPARISON:  None. FINDINGS: CT HEAD FINDINGS Brain: Mildly increased edema associated with the acute left anterior temporal lobe infarct. Local sulcal effacement without midline shift. No substantial change in right caudate and right basal ganglia lesions and small focus of right pontine encephalomalacia, better characterized on yesterday's MRI. Similar position of a right parietal approach ventriculostomy catheter which terminates in the  atrium right lateral ventricle. Similar position of an additional midline occipital approach catheter which terminates between the frontal horns of the lateral ventricles. No evidence of hydrocephalus. No definite acute hemorrhage. No evidence of interval large vascular territory infarct. Vascular: See below. Skull: Mild scattered paranasal sinus mucosal thickening. Sinuses: Mild mucosal thickening.  No acute orbital findings. Orbits: No mastoid effusions. Review of the MIP images confirms the above findings CTA NECK FINDINGS Motion limited evaluation. Aortic arch: Aortic  atherosclerosis. Great vessel origins are patent. Right carotid system: Motion limited evaluation without evidence of significant (greater than 50%) stenosis. Mild calcific and noncalcific atherosclerosis at the carotid bifurcation. Left carotid system: Motion limited evaluation without evidence of greater than 50% stenosis. Mild to moderate calcific and noncalcific atherosclerosis at the carotid bifurcation Vertebral arteries: Mildly right dominant. Essentially nondiagnostic evaluation of the origins/proximal vertebral arteries due to motion and beam hardening artifact with suspected severe stenosis of the left vertebral artery origin. More distal vertebral arteries are patent in the neck. Skeleton: Multilevel severe facet arthropathy. Other neck: No acute abnormality. Upper chest: Visualized lung apices are clear. Review of the MIP images confirms the above findings CTA HEAD FINDINGS Motion limited evaluation. Anterior circulation: Calcific atherosclerosis of bilateral cavernous and paraclinoid ICAs with suspected moderate bilateral paraclinoid ICA stenosis. Severe stenosis versus occlusion of the right ICA terminus. Irregular opacification of a severely narrowed right M1 MCA with opacification of proximal M2 MCA branches. Small, irregular right A1 ACA. The left A1 ACA and visualized proximal A2 ACA branches are patent. Posterior circulation: Motion limited evaluation with very small vertebrobasilar system with bilateral posterior communicating arteries largely supplying the PCAs. Poor visualization of a small right intradural vertebral artery and only minimal opacification of a small basilar artery. Focal loss of opacification of the distal left vertebral artery may represent a superimposed stenosis. The small left vertebral artery appears to largely terminate as PICA. Suspected multifocal moderate to severe stenosis of the small left PCA and severe stenosis of the distal right P2 PCA. Venous sinuses: As  permitted by contrast timing, patent. Review of the MIP images confirms the above findings IMPRESSION: CT head: 1. Mildly increased edema associated with the acute left anterior temporal lobe infarct. Local sulcal effacement without midline shift. 2. No substantial change in right caudate and right basal ganglia lesions and small focus of right pontine encephalomalacia, better characterized on yesterday's MRI. 3. Similar additional chronic findings, as described above. CTA head: 1. Severe stenosis versus short-segment occlusion of the distal left M1 MCA. 2. Severe stenosis versus occlusion of the right ICA terminus. Irregular opacification of a severely narrowed right M1 MCA with opacification of proximal M2 MCA branches. Small, irregular right A1 ACA as well. 3. Motion limited evaluation with very small vertebrobasilar system with bilateral posterior communicating arteries largely supplying the PCAs. Poor visualization of a small right intradural vertebral artery and only minimal opacification of a very small basilar artery. Focal loss of opacification of the distal left vertebral artery may represent a superimposed severe stenosis or occlusion. The small left vertebral artery appears to largely terminate as PICA. 4. Suspected multifocal moderate to severe stenosis of the small left PCA and severe stenosis of the distal right P2 PCA. 5. Moderate bilateral paraclinoid ICA stenosis. CTA neck: 1. Essentially nondiagnostic evaluation of the proximal vertebral arteries due to motion and beam hardening artifact with suspected severe stenosis of the left vertebral artery origin. More distal vertebral arteries are patent in the neck. 2. No significant (greater  than 50%) stenosis of the carotid arteries in the neck. Findings discussed with Dr. Erlinda Hong at 11:29 a.m. via telephone. Electronically Signed   By: Margaretha Sheffield MD   On: 07/07/2021 12:04   ECHOCARDIOGRAM COMPLETE BUBBLE STUDY  Result Date: 07/07/2021     ECHOCARDIOGRAM REPORT   Patient Name:   Adam Sullivan Date of Exam: 07/07/2021 Medical Rec #:  [id number]    Height:       66.0 in Accession #:    [id number]   Weight:       185.2 lb Date of Birth:  [birth year]     BSA:          1.935 m Patient Age:    65 years     BP:           153/71 mmHg Patient Gender: M            HR:           50 bpm. Exam Location:  Inpatient Procedure: 2D Echo, Cardiac Doppler, Color Doppler, Saline Contrast Bubble Study            and Intracardiac Opacification Agent Indications:    Stroke 434.91 / I63.9  History:        Patient has no prior history of Echocardiogram examinations.                 Signs/Symptoms:Dyspnea; Risk Factors:Hypertension. GERD.  Sonographer:    Vickie Epley RDCS Referring Phys: [id number] [facility]  Sonographer Comments: Suboptimal parasternal window. IMPRESSIONS  1. Left ventricular ejection fraction, by estimation, is 55 to 60%. The left ventricle has normal function. The left ventricle has no regional wall motion abnormalities. Left ventricular diastolic parameters were normal.  2. Right ventricular systolic function is normal. The right ventricular size is normal. Tricuspid regurgitation signal is inadequate for assessing PA pressure.  3. Left atrial size was moderately dilated.  4. The mitral valve is normal in structure. Trivial mitral valve regurgitation. No evidence of mitral stenosis.  5. The aortic valve was not well visualized. Aortic valve regurgitation is mild. No aortic stenosis is present.  6. The inferior vena cava is normal in size with greater than 50% respiratory variability, suggesting right atrial pressure of 3 mmHg.  7. Agitated saline contrast bubble study was negative, with no evidence of any interatrial shunt. FINDINGS  Left Ventricle: No left ventricular thrombus witth Definity. Left ventricular ejection fraction, by estimation, is 55 to 60%. The left ventricle has normal function. The left ventricle has no regional wall motion abnormalities.  Definity contrast agent was given IV to delineate the left ventricular endocardial borders. The left ventricular internal cavity size was normal in size. There is no left ventricular hypertrophy. Left ventricular diastolic parameters were normal. Indeterminate filling pressures. Right Ventricle: The right ventricular size is normal. No increase in right ventricular wall thickness. Right ventricular systolic function is normal. Tricuspid regurgitation signal is inadequate for assessing PA pressure. Left Atrium: Left atrial size was moderately dilated. Right Atrium: Right atrial size was normal in size. Pericardium: There is no evidence of pericardial effusion. Mitral Valve: The mitral valve is normal in structure. Trivial mitral valve regurgitation. No evidence of mitral valve stenosis. Tricuspid Valve: The tricuspid valve is normal in structure. Tricuspid valve regurgitation is not demonstrated. Aortic Valve: The aortic valve was not well visualized. Aortic valve regurgitation is mild. No aortic stenosis is present. Pulmonic Valve: The pulmonic valve was grossly normal. Pulmonic valve regurgitation is not  visualized. Aorta: The aortic root was not well visualized. Venous: The inferior vena cava is normal in size with greater than 50% respiratory variability, suggesting right atrial pressure of 3 mmHg. IAS/Shunts: No atrial level shunt detected by color flow Doppler. Agitated saline contrast was given intravenously to evaluate for intracardiac shunting. Agitated saline contrast bubble study was negative, with no evidence of any interatrial shunt.  LEFT VENTRICLE PLAX 2D LVIDd:         5.50 cm      Diastology LVIDs:         4.50 cm      LV e' medial:    6.31 cm/s LV PW:         0.90 cm      LV E/e' medial:  16.3 LV IVS:        0.90 cm      LV e' lateral:   9.03 cm/s LVOT diam:     2.40 cm      LV E/e' lateral: 11.4 LV SV:         126 LV SV Index:   65 LVOT Area:     4.52 cm  LV Volumes (MOD) LV vol d, MOD A2C: 152.0  ml LV vol d, MOD A4C: 118.0 ml LV vol s, MOD A2C: 52.1 ml LV vol s, MOD A4C: 60.0 ml LV SV MOD A2C:     99.9 ml LV SV MOD A4C:     118.0 ml LV SV MOD BP:      84.3 ml RIGHT VENTRICLE RV S prime:     14.30 cm/s TAPSE (M-mode): 2.4 cm LEFT ATRIUM             Index       RIGHT ATRIUM           Index LA diam:        4.60 cm 2.38 cm/m  RA Area:     11.70 cm LA Vol (A2C):   59.8 ml 30.90 ml/m RA Volume:   23.80 ml  12.30 ml/m LA Vol (A4C):   77.2 ml 39.89 ml/m LA Biplane Vol: 68.8 ml 35.55 ml/m  AORTIC VALVE LVOT Vmax:   98.20 cm/s LVOT Vmean:  67.200 cm/s LVOT VTI:    0.278 m MITRAL VALVE MV Area (PHT): 3.20 cm     SHUNTS MV Decel Time: 237 msec     Systemic VTI:  0.28 m MV E velocity: 103.00 cm/s  Systemic Diam: 2.40 cm MV A velocity: 80.00 cm/s MV E/A ratio:  1.29 Mihai Croitoru MD Electronically signed by Sanda Klein MD Signature Date/Time: 07/07/2021/2:22:40 PM    Final    VAS [place] LOWER EXTREMITY VENOUS (DVT)  Result Date: 07/07/2021  Lower Venous DVT Study Patient Name:  Adam Sullivan  Date of Exam:   07/07/2021 Medical Rec #: [id number]     Accession #:    [id number] Date of Birth: 1956/11/26      Patient Gender: M Patient Age:   65Y Exam Location:  [facility] Procedure:      VAS [place] LOWER EXTREMITY VENOUS (DVT) Referring Phys: [id number] Rosalin Hawking --------------------------------------------------------------------------------  Indications: Stroke.  Comparison Study: no prior Performing Technologist: Archie Patten RVS  Examination Guidelines: A complete evaluation includes B-mode imaging, spectral Doppler, color Doppler, and power Doppler as needed of all accessible portions of each vessel. Bilateral testing is considered an integral part of a complete examination. Limited examinations for reoccurring indications may be performed as noted. The reflux portion  of the exam is performed with the patient in reverse Trendelenburg.  +---------+---------------+---------+-----------+----------+--------------+  RIGHT    CompressibilityPhasicitySpontaneityPropertiesThrombus Aging +---------+---------------+---------+-----------+----------+--------------+ CFV      Full           Yes      Yes                                 +---------+---------------+---------+-----------+----------+--------------+ SFJ      Full                                                        +---------+---------------+---------+-----------+----------+--------------+ FV Prox  Full                                                        +---------+---------------+---------+-----------+----------+--------------+ FV Mid   Full                                                        +---------+---------------+---------+-----------+----------+--------------+ FV DistalFull                                                        +---------+---------------+---------+-----------+----------+--------------+ PFV      Full                                                        +---------+---------------+---------+-----------+----------+--------------+ POP      Full           Yes      Yes                                 +---------+---------------+---------+-----------+----------+--------------+ PTV      Full                                                        +---------+---------------+---------+-----------+----------+--------------+ PERO     Full                                                        +---------+---------------+---------+-----------+----------+--------------+   +---------+---------------+---------+-----------+----------+--------------+ LEFT     CompressibilityPhasicitySpontaneityPropertiesThrombus Aging +---------+---------------+---------+-----------+----------+--------------+ CFV      Full           Yes      Yes                                 +---------+---------------+---------+-----------+----------+--------------+  SFJ      Full                                                         +---------+---------------+---------+-----------+----------+--------------+ FV Prox  Full                                                        +---------+---------------+---------+-----------+----------+--------------+ FV Mid   Full                                                        +---------+---------------+---------+-----------+----------+--------------+ FV DistalFull                                                        +---------+---------------+---------+-----------+----------+--------------+ PFV      Full                                                        +---------+---------------+---------+-----------+----------+--------------+ POP      Full           Yes      Yes                                 +---------+---------------+---------+-----------+----------+--------------+ PTV      Full                                                        +---------+---------------+---------+-----------+----------+--------------+ PERO     Full                                                        +---------+---------------+---------+-----------+----------+--------------+     Summary: BILATERAL: - No evidence of deep vein thrombosis seen in the lower extremities, bilaterally. -No evidence of popliteal cyst, bilaterally.   *See table(s) above for measurements and observations. Electronically signed by Jamelle Haring on 07/07/2021 at 4:40:31 PM.    Final     PHYSICAL EXAM   NEURO: Mental Status: lethargic but attempts to keep eyes open throughout exam. Follows some commands and not others. Oriented only to person at this time. Not oriented to place, month, day, date, or year. L  Speech/Language: speech is without dysarthria or aphasia. Word salad noted. He is able to name pen, and does recognize wife  at bedside.  Cranial Nerves: II: PERRL 4 mm/brisk. visual fields full. III, IV, VI: OS ptosis chronic. Eyes dysconjugate. . V:  sensation is intact and symmetrical to face. VII: Smile is symmetrical. Able to puff cheeks and raise eyebrows. VIII:hearing intact to voice. IX, X: palate elevation is symmetric. Phonation normal. XI: normal sternocleidomastoid and trapezius muscle strength. HUT:MLYYTK is symmetrical without fasciculations.    Motor:  All muscle groups are 5/5 with exception of LUE triceps 4/5, Tone is normal. Bulk is decreased throughout. Sensation- Intact to light touch bilaterally in all four extremities. Extinction absent to light touch to DSS. Coordination: FTN intact bilaterally. Does not comprehend HKS. No pronator drift. DTRs:  2+ throughout.   ASSESSMENT/PLAN Adam Sullivan is a 64 y.o. 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 Marcello Moores of neurosurgery, hypertension, hyperlipidemia, GERD, hypothyroidism who presented to [facility] ED from home due to confusion, gait abnormality and word salad.  Neurology  was asked to evaluate for new infarct versus evolution of brain lesions. MRI brain done this admission shows left temporal lobe acute infarct with associated edema, as well as acute infarcts at  right pons. There are also multiple lesions seen at right caudate and internal capsule, concerning for progression of neoplasm.   Neurosurgery ( Dr. Marcello Moores) has seen patient during this admission and will continue to monitor; he had recent brain biopsy of lesions and results are pending.   Stroke:  left temporal lobe     CT head shows new hypodensity in left temporal lobe. 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     CTA head    1. Severe stenosis versus short-segment occlusion of the distal left M1 MCA. 2. Severe stenosis versus occlusion of the right ICA terminus. Irregular opacification of a severely  narrowed right M1 MCA with opacification of proximal M2 MCA branches. Small, irregular right A1 ACA as well. 3. Motion limited evaluation with very small vertebrobasilar system with bilateral posterior communicating arteries largely supplying the PCAs. Poor visualization of a small right intradural vertebral artery and only minimal opacification of a very small basilar artery. Focal loss of opacification of the distal left vertebral artery may represent a superimposed severe stenosis or occlusion. The small left vertebral artery appears to largely terminate as PICA. 4. Suspected multifocal moderate to severe stenosis of the small left PCA and severe stenosis of the distal right P2 PCA. 5. Moderate bilateral paraclinoid ICA stenosis.  CTA neck    suspected severe stenosis of the left vertebral artery origin. More distal vertebral arteries are patent in the neck. 2. No significant (greater than 50%) stenosis of the carotid arteries in the neck.  MRI    1. The hypoattenuation in the left temporal lobe seen on same day CT head correlates with nonenhancing restricted diffusion, compatible with acute infarct. Associated edema and local sulcal effacement. Additional small nonenhancing focus of restricted diffusion in the right paramidline pons likely represents an additional acute infarct 2. Increased size/bulk of expansile, heterogeneously enhancing masses in the right caudate and smaller lesion in the adjacent posterior limb of the internal capsule, as described above. 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 Atypical infection is considered less likely. Recommend correlation with pathology results of the recent biopsy. 3. The previously seen area of  enhancement in the right pons has resolved with focal T2 hyperintensity in this region. Given the apparent resolution of enhancement,  this area may have previously represented an enhancing subacute infarct, but this warrants continued attention on follow-up.  Carotid Doppler  pending 2D Echo 55-60%EF, mod dilated LA, no IA shunt    LDL 53 HgbA1c 6.2 VTE prophylaxis - lovenox    Diet   Diet regular Room service appropriate? Yes; Fluid consistency: Thin   No antithrombotic prior to admission, now on aspirin 81 mg daily and clopidogrel 75 mg daily for 3 months then aspirin 81 daily.  Therapy recommendations:  outpatient PT Disposition:  home  Plan to start patient on low dose aspirin and plavix  for 3 months, followed by aspirin only.   Hyperlipidemia LDL 53, goal < 70  High intensity statin zocor 40mg  daily Continue statin at discharge  Diabetes type II  Controlled HgbA1c 6.2, goal < 7.0 CBGs Recent Labs    07/08/21 0416 07/08/21 0620 07/08/21 0825  GLUCAP 153* 123* 104*       Other Stroke Risk Factors  History of brain lesions with XRT History of prostate cancer post prostatectomy    Other Active Problems Leukocytosis  GERD  Hospital day # 1     To contact Stroke Continuity provider, please refer to [email or web address] After hours, contact General Neurology

## 2021-07-08 NOTE — Plan of Care (Signed)
  Problem: Education: Goal: Knowledge of disease or condition will improve Outcome: Adequate for Discharge Goal: Knowledge of secondary prevention will improve Outcome: Adequate for Discharge

## 2021-07-08 NOTE — Care Management (Signed)
Spoke w patient's wife. She confirmed that he is scheduled later this month for PT OT SLP at [facility].  Northington,Leslie A Spouse   [phone]

## 2021-07-08 NOTE — Discharge Summary (Signed)
Physician Discharge Summary  NANDAN WILLEMS DXI:[id number] DOB: [birth year] DOA: 07/06/2021  PCP: Binnie Rail, MD  Admit date: 07/06/2021 Discharge date: 07/08/2021  Admitted From: Home Disposition: Home  Recommendations for Outpatient Follow-up:  Follow ups as below. Patient has upcoming appointment with neurosurgery on 7/11 Please obtain CBC/BMP/Mag at follow up Check TSH in about 4 to 5 weeks. Goal SBP 130-150 mmHg Please follow up on the following pending results: None  Home Health: Not indicated Equipment/Devices: None indicated  Discharge Condition: Stable CODE STATUS: Full code   Follow-up Information     Garvin Fila, MD. Schedule an appointment as soon as possible for a visit in 1 month(s).   Specialties: Neurology, Radiology Why: stroke clinic Contact information: [address] [place] [address] [phone]         Binnie Rail, MD. Schedule an appointment as soon as possible for a visit in 1 week(s).   Specialty: Internal Medicine Contact information: [place] [address] [phone]                 Hospital Course: 65 year old M with PMH of prostate cancer s/p prostatectomy in 2014, CVA in 2021, pineocytoma s/p resection, XRT and VPS in 1993, and recent stereotactic biopsy of brain lesions on 6/30 by Dr. Marcello Moores, HTN, hypothyroidism, HLD and GERD presenting with confusion, gait disturbance, weakness and word salad, and admitted for acute CVA with MRI showing anterior left temporal lobe acute or subacute infarct, and increased size/bulk of right caudate and posterior limb IC lesions.  Neurosurgery and neurology consulted, and recommended complete CVA work-up.    CTA head and neck with multiple intracranial stenosis.  TTE bubble study with LVEF of 55 to 60% and moderate LAE but no other significant abnormality.  LE [place] negative for DVT.  EEG negative for seizure or epileptiform discharge.  TSH low at 0.057.  Free  T4 1.43.  Neurology recommended Plavix and low-dose aspirin for 3 months followed by aspirin alone, and allowing some permissive hypertension.  Patient has upcoming appointment with neurosurgery on 07/09/2021.  We have decreased his Synthroid from 88 mcg to 75 mcg.  He will continue his Decadron taper to complete course.  We discontinued Keppra as this was intended for 7 days after brain biopsy on 6/30 (note that neuro note says continue Keppra but they differ this to neurosurgery).  He has been started on low-dose metformin to halt progression of prediabetes to diabetes.  PT recommended continuing with planned outpatient PT. has been cleared by OT and SLP.  See individual problem list below for more on hospital course.  Discharge Diagnoses:  Left anterior temporal stroke, acute to early subacute-noted on MRI brain.  Presented with confusion, gait disturbance, generalized weakness and word salad.  He also has right caudate and IC lesions.  Neuro exam reassuring except his mental status.  EEG without epileptiform discharge or seizure.  CTA head and neck with multiple significant intracranial stenosis.  TTE without significant finding.  He has mild hyponatremia and low TSH with high free T4.  A1c 6.2%.  LDL is 53.  Patient's symptoms improved.  Eager to go home. -Continue aspirin and Plavix for 3 months followed by aspirin alone -Continue Vytorin. -Discontinue lisinopril.  Patient to resume his HCTZ in 3 days -He would benefit from slight permissive hypertension with SBP in the range of 130 to 150 mmHg -Patient to continue outpatient PT. cleared by OT and SLP.  Pineocytoma s/p  resection, XRT and VPS in 1999 Brain lesions s/p stereotactic biopsy on 06/28/2021 by Dr. Marcello Moores -MRI revealed increased size in right caudate and IC lesions -Patient to continue Decadron taper as previously planned -Keppra discontinued.  This was intended for 7 days after biopsy on 6/30 -Outpatient follow-up with neurosurgery on  7/11   Hypovolemic hyponatremia-likely due to HCTZ.  Improved. -Continue holding HCTZ until 7/13 -Lisinopril discontinued.   Hypothyroidism: TSH low at 0.057.  Free T4 and T3 elevated at 1.43 and 1.7.  Per patient's wife, his Synthroid was increased to 88 mcg about a month ago. -Decrease Synthroid from 88 mmHg to 75 mmHg. -Needs TSH recheck in about 4 to 6 weeks   Acute encephalopathy: Multifactorial including CVA, brain lesions, hyponatremia, steroid or delirium.  Has history of B12 deficiency.  EEG negative for seizure or epileptiform discharge.  B12 585.  MRI finding as above.  TSH as above.  Ammonia was normal.  Encephalopathy seems to have resolved. -Adjusted Synthroid as above   Leukocytosis: Likely demargination from steroid.  Hyperlipidemia -Continue home Vytorin   GERD -Resume home regimen   Prediabetes with hyperglycemia: A1c 6.2%.  Hyperglycemia likely due to steroid, and controlled -Started low-dose metformin   Ambulatory dysfunction -Patient to resume outpatient PT  There is no height or weight on file to calculate BMI.            Discharge Exam: Vitals:   07/08/21 0830 07/08/21 1212  BP:  (!) 177/82  Pulse:  (!) 49  Resp:  18  Temp:  98.3 F (36.8 C)  SpO2: 98% 100%    GENERAL: No apparent distress.  Nontoxic. HEENT: MMM.  Vision and hearing grossly intact.  NECK: Supple.  No apparent JVD.  RESP: On RA.  No IWOB.  Fair aeration bilaterally. CVS:  RRR. Heart sounds normal.  ABD/GI/GU: Bowel sounds present. Soft. Non tender.  MSK/EXT:  Moves extremities. No apparent deformity. No edema.  SKIN: no apparent skin lesion or wound NEURO: Awake, alert and oriented appropriately.  Cranial nerves, motor, sensory, patellar reflexes and finger-to-nose grossly intact.  No apparent focal neuro deficit. PSYCH: Calm. Normal affect.  Discharge Instructions  Discharge Instructions     Ambulatory referral to Neurology   Complete by: As directed    Follow up  with Dr. Leonie Man at [facility] in 4-6 weeks. Too complicated for RN to follow. Thanks.   Diet - low sodium heart healthy   Complete by: As directed    Diet Carb Modified   Complete by: As directed    Discharge instructions   Complete by: As directed    It has been a pleasure taking care of you!  You were hospitalized with stroke on the left side of your brain likely from narrowing of the blood vessels.  We have started you on blood thinners (Plavix and aspirin) to reduce your risk of stroke in future.  You will continue taking both for 3 months, then you will drop Plavix and continue taking aspirin.  It is very important that you avoid any over-the-counter pain medication other than plain Tylenol while taking this medications.  Also monitor for any signs of bleeding. We have stopped your lisinopril. You may restart your hydrochlorothiazide in the next 3 to 4 days if your systolic blood pressure is greater than 140.  Your goal is to keep the upper number between 130 and 150 mmHg.  Your thyroid level is also high.  We decreased your Synthroid.  We have also started  you on metformin which would decrease the progression of prediabetes to diabetes.  Continue taking your Vytorin.  Follow-up with your neurosurgeon as previously planned.  You may follow-up with your primary care doctor in 1 to 2 weeks.  Neurology will arrange outpatient follow-up in about 4 weeks.   Take care,   Increase activity slowly   Complete by: As directed    No wound care   Complete by: As directed       Allergies as of 07/08/2021       Reactions   Chocolate Other (See Comments)   Unsure of reaction   Dust Mite Extract Other (See Comments)   "Asthma"   Erythromycin Other (See Comments)   Reaction not recalled- occurred in childhood   [place] Other (See Comments)   Cannot drink Pepsi or Coca-Cola, unsure of reaction   Other Other (See Comments)   Multigrain bread and tested allergic to many things- caused blistering on his back  when allergy tested        Medication List     STOP taking these medications    docusate sodium 100 MG capsule Commonly known as: COLACE   levETIRAcetam 500 MG tablet Commonly known as: KEPPRA   levothyroxine 88 MCG tablet Commonly known as: Synthroid Replaced by: Synthroid 75 MCG tablet   lisinopril 10 MG tablet Commonly known as: ZESTRIL   Synthroid 88 MCG tablet Generic drug: levothyroxine       TAKE these medications    Advair Diskus 250-50 MCG/ACT Aepb Generic drug: fluticasone-salmeterol USE 1 PUFF EVERY 12 HOURS. What changed: See the new instructions.   albuterol 108 (90 Base) MCG/ACT inhaler Commonly known as: ProAir HFA Inhale 2 puffs into the lungs every 6 (six) hours as needed for shortness of breath or wheezing.   aspirin 81 MG EC tablet Take 1 tablet (81 mg total) by mouth daily. Swallow whole.   CENTRUM SILVER 50+MEN PO Take 1 tablet by mouth every evening.   cetirizine 10 MG tablet Commonly known as: ZYRTEC Take 10 mg by mouth in the morning.   clopidogrel 75 MG tablet Commonly known as: PLAVIX Take 1 tablet (75 mg total) by mouth daily.   dexamethasone 2 MG tablet Commonly known as: DECADRON Take 1 tablet (2 mg total) by mouth 3 (three) times daily for 1 day, THEN 1 tablet (2 mg total) 2 (two) times daily for 3 days, THEN 0.5 tablets (1 mg total) daily for 3 days. Start taking on: July 08, 2021 What changed: See the new instructions.   ezetimibe-simvastatin 10-40 MG tablet Commonly known as: VYTORIN Take 1 tablet by mouth at bedtime.   hydrochlorothiazide 25 MG tablet Commonly known as: HYDRODIURIL Take 1 tablet (25 mg total) by mouth daily. Start taking on: July 11, 2021 What changed: These instructions start on July 11, 2021. If you are unsure what to do until then, ask your doctor or other care provider.   metFORMIN 500 MG tablet Commonly known as: Glucophage Take 1 tablet (500 mg total) by mouth daily with breakfast for 15  days, THEN 1 tablet (500 mg total) 2 (two) times daily with a meal. Start taking on: July 08, 2021   montelukast 10 MG tablet Commonly known as: SINGULAIR Take 1 tablet (10 mg total) by mouth at bedtime.   pantoprazole 40 MG tablet Commonly known as: PROTONIX Take 1 tablet (40 mg total) by mouth daily. What changed: when to take this   Synthroid 75 MCG tablet Generic drug:  levothyroxine Take 1 tablet (75 mcg total) by mouth daily. Replaces: levothyroxine 88 MCG tablet   vitamin B-12 500 MCG tablet Commonly known as: CYANOCOBALAMIN Take 500 mcg by mouth every evening.        Consultations: Neurology Neurosurgery  Procedures/Studies:    CT ANGIO HEAD NECK W WO CM  Result Date: 07/07/2021 CLINICAL DATA:  Stroke follow-up. EXAM: CT ANGIOGRAPHY HEAD AND NECK TECHNIQUE: Multidetector CT imaging of the head and neck was performed using the standard protocol during bolus administration of intravenous contrast. Multiplanar CT image reconstructions and MIPs were obtained to evaluate the vascular anatomy. Carotid stenosis measurements (when applicable) are obtained utilizing NASCET criteria, using the distal internal carotid diameter as the denominator. CONTRAST:  168mL OMNIPAQUE IOHEXOL 350 MG/ML SOLN COMPARISON:  None. FINDINGS: CT HEAD FINDINGS Brain: Mildly increased edema associated with the acute left anterior temporal lobe infarct. Local sulcal effacement without midline shift. No substantial change in right caudate and right basal ganglia lesions and small focus of right pontine encephalomalacia, better characterized on yesterday's MRI. Similar position of a right parietal approach ventriculostomy catheter which terminates in the atrium right lateral ventricle. Similar position of an additional midline occipital approach catheter which terminates between the frontal horns of the lateral ventricles. No evidence of hydrocephalus. No definite acute hemorrhage. No evidence of interval large  vascular territory infarct. Vascular: See below. Skull: Mild scattered paranasal sinus mucosal thickening. Sinuses: Mild mucosal thickening.  No acute orbital findings. Orbits: No mastoid effusions. Review of the MIP images confirms the above findings CTA NECK FINDINGS Motion limited evaluation. Aortic arch: Aortic atherosclerosis. Great vessel origins are patent. Right carotid system: Motion limited evaluation without evidence of significant (greater than 50%) stenosis. Mild calcific and noncalcific atherosclerosis at the carotid bifurcation. Left carotid system: Motion limited evaluation without evidence of greater than 50% stenosis. Mild to moderate calcific and noncalcific atherosclerosis at the carotid bifurcation Vertebral arteries: Mildly right dominant. Essentially nondiagnostic evaluation of the origins/proximal vertebral arteries due to motion and beam hardening artifact with suspected severe stenosis of the left vertebral artery origin. More distal vertebral arteries are patent in the neck. Skeleton: Multilevel severe facet arthropathy. Other neck: No acute abnormality. Upper chest: Visualized lung apices are clear. Review of the MIP images confirms the above findings CTA HEAD FINDINGS Motion limited evaluation. Anterior circulation: Calcific atherosclerosis of bilateral cavernous and paraclinoid ICAs with suspected moderate bilateral paraclinoid ICA stenosis. Severe stenosis versus occlusion of the right ICA terminus. Irregular opacification of a severely narrowed right M1 MCA with opacification of proximal M2 MCA branches. Small, irregular right A1 ACA. The left A1 ACA and visualized proximal A2 ACA branches are patent. Posterior circulation: Motion limited evaluation with very small vertebrobasilar system with bilateral posterior communicating arteries largely supplying the PCAs. Poor visualization of a small right intradural vertebral artery and only minimal opacification of a small basilar artery.  Focal loss of opacification of the distal left vertebral artery may represent a superimposed stenosis. The small left vertebral artery appears to largely terminate as PICA. Suspected multifocal moderate to severe stenosis of the small left PCA and severe stenosis of the distal right P2 PCA. Venous sinuses: As permitted by contrast timing, patent. Review of the MIP images confirms the above findings IMPRESSION: CT head: 1. Mildly increased edema associated with the acute left anterior temporal lobe infarct. Local sulcal effacement without midline shift. 2. No substantial change in right caudate and right basal ganglia lesions and small focus of right pontine encephalomalacia, better  characterized on yesterday's MRI. 3. Similar additional chronic findings, as described above. CTA head: 1. Severe stenosis versus short-segment occlusion of the distal left M1 MCA. 2. Severe stenosis versus occlusion of the right ICA terminus. Irregular opacification of a severely narrowed right M1 MCA with opacification of proximal M2 MCA branches. Small, irregular right A1 ACA as well. 3. Motion limited evaluation with very small vertebrobasilar system with bilateral posterior communicating arteries largely supplying the PCAs. Poor visualization of a small right intradural vertebral artery and only minimal opacification of a very small basilar artery. Focal loss of opacification of the distal left vertebral artery may represent a superimposed severe stenosis or occlusion. The small left vertebral artery appears to largely terminate as PICA. 4. Suspected multifocal moderate to severe stenosis of the small left PCA and severe stenosis of the distal right P2 PCA. 5. Moderate bilateral paraclinoid ICA stenosis. CTA neck: 1. Essentially nondiagnostic evaluation of the proximal vertebral arteries due to motion and beam hardening artifact with suspected severe stenosis of the left vertebral artery origin. More distal vertebral arteries are  patent in the neck. 2. No significant (greater than 50%) stenosis of the carotid arteries in the neck. Findings discussed with Dr. Erlinda Hong at 11:29 a.m. via telephone. Electronically Signed   By: Margaretha Sheffield MD   On: 07/07/2021 12:04   DG Chest 2 View  Result Date: 07/06/2021 CLINICAL DATA:  Altered mental status, confusion for 24 hours EXAM: CHEST - 2 VIEW COMPARISON:  10/20/2013 FINDINGS: Enlargement of cardiac silhouette. Mediastinal contours and pulmonary vascularity normal. Lungs clear. No infiltrate, pleural effusion, or pneumothorax. Bones demineralized. IMPRESSION: No acute abnormalities. Enlargement of cardiac silhouette. Electronically Signed   By: Lavonia Dana M.D.   On: 07/06/2021 13:06   CT HEAD WO CONTRAST  Result Date: 07/06/2021 CLINICAL DATA:  Altered mental status. EXAM: CT HEAD WITHOUT CONTRAST TECHNIQUE: Contiguous axial images were obtained from the base of the skull through the vertex without intravenous contrast. COMPARISON:  06/29/2021. FINDINGS: Brain: New hypodensity and loss of gray-white differentiation in the anterior left temporal lobe, most likely an acute or subacute infarct. Interval resolution of the small volume of pneumocephalus seen on the prior. Redemonstrated hyperdense right caudate mass which appears similar in size in comparison to recent CT head from 06/29/2021. Linear hypodensity adjacent to the mass likely is a sequela recent biopsy. Mild regional mass effect is also similar. No convincing acute/interval hemorrhage. Heterogeneous hyperdensity in the basal ganglia likely reflects tumor when comparing to prior MRI. Similar small hypodensity in the right pons. Similar position of a right parietal approach ventriculostomy catheter which terminates in the atrium right lateral ventricle. Low position of additional midline occipital approach catheter which terminates between the frontal horns of the lateral ventricles. No evidence of hydrocephalus. Old ventriculostomy  catheter tract is noted in the right frontal lobe. Vascular: Calcific atherosclerosis. No hyperdense vessel identified. Skull: Right frontal craniotomy. Right-sided burr hole. Suboccipital craniectomy. Sinuses/Orbits: Mild paranasal sinus mucosal thickening. No acute orbital findings. Other: No mastoid effusions. IMPRESSION: 1. New hypodensity and loss of gray-white differentiation in the anterior left temporal lobe, most likely an acute or subacute infarct. Recommend MRI with contrast to confirm and to exclude other etiologies. 2. In comparison to recent CT head from June 29, 2021, no substantial change in the masslike densities in the right caudate and basal ganglia, further evaluated on prior MRI. 3. 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 Electronically Signed   By: Margaretha Sheffield MD   On:  07/06/2021 14:26   CT HEAD WO CONTRAST  Result Date: 06/29/2021 CLINICAL DATA:  65 year old male with history of ventriculoperitoneal shunt. Multifocal enhancing brain lesions on MRI in May, most notably the right caudate nucleus. Postoperative day 1 status post stereotactic caudate lesion biopsy. EXAM: CT HEAD WITHOUT CONTRAST TECHNIQUE: Contiguous axial images were obtained from the base of the skull through the vertex without intravenous contrast. COMPARISON:  Brain MRI 05/24/2021 and earlier. FINDINGS: Brain: Small volume pneumocephalus, including along a biopsy tract from the right superior frontal bone to the region of the hyperdense right caudate mass (series 2, image 22). The mass appears larger by CT since 05/23/2021, about 30 mm now versus 24 mm at that time. Mild regional mass effect is not significantly changed. No convincing acute intracranial hemorrhage identified. Heterogeneous density in the right basal ganglia appears to be tumor related as on the MRI (series 2, image 19) and is more conspicuous since the CT that month. No midline shift. Stable basilar cisterns. No ventriculomegaly.  Chronic right posterior and midline suboccipital shunt catheters. Stable gray-white matter differentiation in the left hemisphere and posterior fossa, including stable CT appearance of the right pons. No cortically based acute infarct identified. Vascular: Extensive calcified atherosclerosis at the skull base. Skull: Chronic suboccipital craniectomy. Remote right frontotemporal craniotomy. Chronic right posterior convexity burr hole. Small new right frontal bone postoperative burr hole. No other acute intracranial abnormality. Sinuses/Orbits: Visualized paranasal sinuses and mastoids are stable and well aerated. Other: Subtle postoperative changes at the right scalp vertex. Chronic right lateral convexity shunt reservoir and tubing appears stable. Negative orbits. IMPRESSION: 1. No adverse features status post frontal approach stereotactic biopsy of the right caudate. 2. Heterogeneous mass-like density at the right caudate and basal ganglia does appear increased by CT since 05/23/2021 suggesting progression of tumor. 3. Stable chronic ventriculostomy shunts. No new intracranial abnormality. Electronically Signed   By: Genevie Ann M.D.   On: 06/29/2021 06:55   MR Brain W and Wo Contrast  Result Date: 07/06/2021 CLINICAL DATA:  Neuro deficit, acute stroke suspected. EXAM: MRI HEAD WITHOUT AND WITH CONTRAST TECHNIQUE: Multiplanar, multiecho pulse sequences of the brain and surrounding structures were obtained without and with intravenous contrast. CONTRAST:  3mL GADAVIST GADOBUTROL 1 MMOL/ML IV SOLN COMPARISON:  MRI May 24, 2021. FINDINGS: Brain: The hypoattenuation in the left temporal lobe seen on same day CT head correlates with nonenhancing restricted diffusion, most consistent with acute infarct.Associated edema and local sulcal effacement. Increased size of expansile T2/FLAIR hyperintensity within the right caudate with increasingly heterogeneous enhancement, now measuring 2.9 x 2.0 cm on series 9, image 14,  previously 2.5 x 1.5 cm when remeasured. Enhancement abuts and is inseparable from the frontal horn of the right lateral ventricle, separate. Heterogeneous enhancement also extends into adjacent frontal white matter superiorly. Additionally, mildly increased size of the expansile, peripherally enhancing lesion in the posterior limb of the right internal capsule, now measuring approximately 1.5 cm on series 9, image 12, previously 1.2 cm when remeasured. Both these areas demonstrate restricted diffusion, suggestive of hypercellularity. The previously seen enhancement in the right pons has resolved with small focus of T2 hyperintensity in this region. Similar chronic hemorrhagic lacunar infarcts in bilateral basal ganglia and chronic microhemorrhages in the thalami and elsewhere in the cerebral and cerebellum, as well as the brainstem. Right parietal approach ventriculostomy catheter is again noted a terminating in the atrium of the right lateral ventricle. Additionally, there is a midline ventriculostomy catheter which terminates in similar  position. No hydrocephalus. Remote ventriculostomy catheter tract in the right frontal lobe, similar. No extra-axial fluid collection.  No midline shift. Vascular: Major arterial flow voids are maintained at the skull base. Small vertebrobasilar system, similar to prior. Skull and upper cervical spine: Normal marrow signal. Sinuses/Orbits: Mild sinus mucosal thickening.  Unremarkable orbits. IMPRESSION: 1. The hypoattenuation in the left temporal lobe seen on same day CT head correlates with nonenhancing restricted diffusion, compatible with acute infarct. Associated edema and local sulcal effacement. Additional small nonenhancing focus of restricted diffusion in the right paramidline pons likely represents an additional acute infarct 2. Increased size/bulk of expansile, heterogeneously enhancing masses in the right caudate and smaller lesion in the adjacent posterior limb of the  internal capsule, as described above. 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 Atypical infection is considered less likely. Recommend correlation with pathology results of the recent biopsy. 3. The previously seen area of enhancement in the right pons has resolved with focal T2 hyperintensity in this region. Given the apparent resolution of enhancement, this area may have previously represented an enhancing subacute infarct, but this warrants continued attention on follow-up. Findings discussed with Dr. Vanita Panda at 7:18 p.m. via telephone. 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 EEG to evaluate for seizure. Level of alertness: Awake AEDs during EEG study: LEV Technical aspects: This EEG study was done with scalp electrodes positioned according to the 10-20 International system of electrode placement. Electrical activity was acquired at a sampling rate of 500Hz  and reviewed with a high frequency filter of 70Hz  and a low frequency filter of 1Hz . EEG data were recorded continuously and digitally stored. Description: The posterior dominant rhythm consists of 8Hz  activity of moderate voltage (25-35 uV) seen predominantly in posterior head regions, symmetric and reactive to eye opening and eye closing. EEG showed continuous generalized left > right frontal 3 to 6 Hz theta-delta slowing.  Hyperventilation and photic stimulation were not performed.   ABNORMALITY - Continuous slow,left>right frontal region  IMPRESSION: This study is suggestive of cortical dysfunction arising from left> right frontal region likely secondary to underlying structural abnormality. No seizures or definite epileptiform discharges were seen throughout the recording. If suspicion for ictal-interictal activity remains a concern, a prolonged study can be considered. Lora Havens   ECHOCARDIOGRAM COMPLETE BUBBLE STUDY  Result Date: 07/07/2021    ECHOCARDIOGRAM REPORT   Patient Name:   DHIREN AZIMI Date of Exam: 07/07/2021 Medical Rec #:  [id number]    Height:       66.0 in Accession #:    [id number]   Weight:       185.2 lb Date of Birth:  [birth year]     BSA:          1.935 m Patient Age:    39 years     BP:           153/71 mmHg Patient Gender: M            HR:  50 bpm. Exam Location:  Inpatient Procedure: 2D Echo, Cardiac Doppler, Color Doppler, Saline Contrast Bubble Study            and Intracardiac Opacification Agent Indications:    Stroke 434.91 / I63.9  History:        Patient has no prior history of Echocardiogram examinations.                 Signs/Symptoms:Dyspnea; Risk Factors:Hypertension. GERD.  Sonographer:    Vickie Epley RDCS Referring Phys: [id number] [facility]  Sonographer Comments: Suboptimal parasternal window. IMPRESSIONS  1. Left ventricular ejection fraction, by estimation, is 55 to 60%. The left ventricle has normal function. The left ventricle has no regional wall motion abnormalities. Left ventricular diastolic parameters were normal.  2. Right ventricular systolic function is normal. The right ventricular size is normal. Tricuspid regurgitation signal is inadequate for assessing PA pressure.  3. Left atrial size was moderately dilated.  4. The mitral valve is normal in structure. Trivial mitral valve regurgitation. No evidence of mitral stenosis.  5. The aortic valve was not well visualized. Aortic valve regurgitation is mild. No aortic stenosis is present.  6. The inferior vena cava is normal in size with  greater than 50% respiratory variability, suggesting right atrial pressure of 3 mmHg.  7. Agitated saline contrast bubble study was negative, with no evidence of any interatrial shunt. FINDINGS  Left Ventricle: No left ventricular thrombus witth Definity. Left ventricular ejection fraction, by estimation, is 55 to 60%. The left ventricle has normal function. The left ventricle has no regional wall motion abnormalities. Definity contrast agent was given IV to delineate the left ventricular endocardial borders. The left ventricular internal cavity size was normal in size. There is no left ventricular hypertrophy. Left ventricular diastolic parameters were normal. Indeterminate filling pressures. Right Ventricle: The right ventricular size is normal. No increase in right ventricular wall thickness. Right ventricular systolic function is normal. Tricuspid regurgitation signal is inadequate for assessing PA pressure. Left Atrium: Left atrial size was moderately dilated. Right Atrium: Right atrial size was normal in size. Pericardium: There is no evidence of pericardial effusion. Mitral Valve: The mitral valve is normal in structure. Trivial mitral valve regurgitation. No evidence of mitral valve stenosis. Tricuspid Valve: The tricuspid valve is normal in structure. Tricuspid valve regurgitation is not demonstrated. Aortic Valve: The aortic valve was not well visualized. Aortic valve regurgitation is mild. No aortic stenosis is present. Pulmonic Valve: The pulmonic valve was grossly normal. Pulmonic valve regurgitation is not visualized. Aorta: The aortic root was not well visualized. Venous: The inferior vena cava is normal in size with greater than 50% respiratory variability, suggesting right atrial pressure of 3 mmHg. IAS/Shunts: No atrial level shunt detected by color flow Doppler. Agitated saline contrast was given intravenously to evaluate for intracardiac shunting. Agitated saline contrast bubble study was  negative, with no evidence of any interatrial shunt.  LEFT VENTRICLE PLAX 2D LVIDd:         5.50 cm      Diastology LVIDs:         4.50 cm      LV e' medial:    6.31 cm/s LV PW:         0.90 cm      LV E/e' medial:  16.3 LV IVS:        0.90 cm      LV e' lateral:   9.03 cm/s LVOT diam:     2.40 cm  LV E/e' lateral: 11.4 LV SV:         126 LV SV Index:   65 LVOT Area:     4.52 cm  LV Volumes (MOD) LV vol d, MOD A2C: 152.0 ml LV vol d, MOD A4C: 118.0 ml LV vol s, MOD A2C: 52.1 ml LV vol s, MOD A4C: 60.0 ml LV SV MOD A2C:     99.9 ml LV SV MOD A4C:     118.0 ml LV SV MOD BP:      84.3 ml RIGHT VENTRICLE RV S prime:     14.30 cm/s TAPSE (M-mode): 2.4 cm LEFT ATRIUM             Index       RIGHT ATRIUM           Index LA diam:        4.60 cm 2.38 cm/m  RA Area:     11.70 cm LA Vol (A2C):   59.8 ml 30.90 ml/m RA Volume:   23.80 ml  12.30 ml/m LA Vol (A4C):   77.2 ml 39.89 ml/m LA Biplane Vol: 68.8 ml 35.55 ml/m  AORTIC VALVE LVOT Vmax:   98.20 cm/s LVOT Vmean:  67.200 cm/s LVOT VTI:    0.278 m MITRAL VALVE MV Area (PHT): 3.20 cm     SHUNTS MV Decel Time: 237 msec     Systemic VTI:  0.28 m MV E velocity: 103.00 cm/s  Systemic Diam: 2.40 cm MV A velocity: 80.00 cm/s MV E/A ratio:  1.29 Mihai Croitoru MD Electronically signed by Sanda Klein MD Signature Date/Time: 07/07/2021/2:22:40 PM    Final    VAS [place] LOWER EXTREMITY VENOUS (DVT)  Result Date: 07/07/2021  Lower Venous DVT Study Patient Name:  VANDELL KUN  Date of Exam:   07/07/2021 Medical Rec #: [id number]     Accession #:    [id number] Date of Birth: [birth year]      Patient Gender: M Patient Age:   65Y Exam Location:  [facility] Procedure:      VAS [place] LOWER EXTREMITY VENOUS (DVT) Referring Phys: [id number] Rosalin Hawking --------------------------------------------------------------------------------  Indications: Stroke.  Comparison Study: no prior Performing Technologist: Archie Patten RVS  Examination Guidelines: A complete evaluation includes B-mode  imaging, spectral Doppler, color Doppler, and power Doppler as needed of all accessible portions of each vessel. Bilateral testing is considered an integral part of a complete examination. Limited examinations for reoccurring indications may be performed as noted. The reflux portion of the exam is performed with the patient in reverse Trendelenburg.  +---------+---------------+---------+-----------+----------+--------------+ RIGHT    CompressibilityPhasicitySpontaneityPropertiesThrombus Aging +---------+---------------+---------+-----------+----------+--------------+ CFV      Full           Yes      Yes                                 +---------+---------------+---------+-----------+----------+--------------+ SFJ      Full                                                        +---------+---------------+---------+-----------+----------+--------------+ FV Prox  Full                                                        +---------+---------------+---------+-----------+----------+--------------+  FV Mid   Full                                                        +---------+---------------+---------+-----------+----------+--------------+ FV DistalFull                                                        +---------+---------------+---------+-----------+----------+--------------+ PFV      Full                                                        +---------+---------------+---------+-----------+----------+--------------+ POP      Full           Yes      Yes                                 +---------+---------------+---------+-----------+----------+--------------+ PTV      Full                                                        +---------+---------------+---------+-----------+----------+--------------+ PERO     Full                                                        +---------+---------------+---------+-----------+----------+--------------+    +---------+---------------+---------+-----------+----------+--------------+ LEFT     CompressibilityPhasicitySpontaneityPropertiesThrombus Aging +---------+---------------+---------+-----------+----------+--------------+ CFV      Full           Yes      Yes                                 +---------+---------------+---------+-----------+----------+--------------+ SFJ      Full                                                        +---------+---------------+---------+-----------+----------+--------------+ FV Prox  Full                                                        +---------+---------------+---------+-----------+----------+--------------+ FV Mid   Full                                                        +---------+---------------+---------+-----------+----------+--------------+   FV DistalFull                                                        +---------+---------------+---------+-----------+----------+--------------+ PFV      Full                                                        +---------+---------------+---------+-----------+----------+--------------+ POP      Full           Yes      Yes                                 +---------+---------------+---------+-----------+----------+--------------+ PTV      Full                                                        +---------+---------------+---------+-----------+----------+--------------+ PERO     Full                                                        +---------+---------------+---------+-----------+----------+--------------+     Summary: BILATERAL: - No evidence of deep vein thrombosis seen in the lower extremities, bilaterally. -No evidence of popliteal cyst, bilaterally.   *See table(s) above for measurements and observations. Electronically signed by Jamelle Haring on 07/07/2021 at 4:40:31 PM.    Final    CT CHEST ABDOMEN PELVIS WO CONTRAST  Result Date:  06/12/2021 CLINICAL DATA:  Brain lesions seen on recent MRI. Evaluate for possible primary cancer. EXAM: CT CHEST, ABDOMEN AND PELVIS WITHOUT CONTRAST TECHNIQUE: Multidetector CT imaging of the chest, abdomen and pelvis was performed following the standard protocol without IV contrast. COMPARISON:  None. FINDINGS: CT CHEST FINDINGS Cardiovascular: The heart is normal in size. No pericardial effusion. The aorta demonstrates moderate tortuosity and mild calcification. Age advanced coronary artery calcifications are noted in the LAD. Left carotid artery calcifications are also noted. Mediastinum/Nodes: No mediastinal or hilar mass or adenopathy. Small scattered lymph nodes are normal sized. The esophagus is unremarkable. Lungs/Pleura: No pulmonary lesions are identified to suggest a primary lung neoplasm. No metastatic pulmonary nodules. No infiltrates, edema or effusions. No interstitial lung disease or bronchiectasis. The central tracheobronchial tree is unremarkable. There is a normal variant of right-sided tracheal bronchus. Musculoskeletal: No chest wall mass, supraclavicular or axillary adenopathy. There is a VP shunt catheter coursing down the subcutaneous tissues of the right chest wall. The bony thorax is intact. No worrisome bone lesions. CT ABDOMEN PELVIS FINDINGS Hepatobiliary: No hepatic lesions or intrahepatic biliary dilatation. The gallbladder is unremarkable. No common bile duct dilatation. Pancreas: No mass, inflammation or ductal dilatation. Spleen: Normal size.  No focal lesions. Adrenals/Urinary Tract: Adrenal glands and kidneys are unremarkable. No renal lesions or hydronephrosis. The bladder is unremarkable. Stomach/Bowel: The stomach, duodenum, small  bowel and colon are unremarkable. No acute inflammatory changes, mass lesions or obstructive findings. Vascular/Lymphatic: Moderate tortuosity and calcification of the abdominal aorta but no aneurysm. No mesenteric or retroperitoneal mass or  adenopathy. Reproductive: Status post prostatectomy. Other: No pelvic mass or lymphadenopathy. Borderline scattered inguinal lymph nodes bilaterally likely reactive or inflammatory. VP shunt tip is in the left abdomen. There is a second coiled catheter in the pelvis. This may be a fractured portion of the VP shunt as it is not exiting the abdominal cavity. Musculoskeletal: No significant bony findings.2 no lytic or sclerotic bone lesions are identified. Moderate degenerative changes involving the left hip are noted. IMPRESSION: 1. No findings for primary neoplasm or metastatic disease. 2. Age advanced coronary artery calcifications. 3. Status post prostatectomy. 4. Borderline inguinal lymph nodes are likely reactive. 5. VP shunt catheter as detailed above. 6. Aortic atherosclerosis. Aortic Atherosclerosis (ICD10-I70.0). Electronically Signed   By: Marijo Sanes M.D.   On: 06/12/2021 10:53       The results of significant diagnostics from this hospitalization (including imaging, microbiology, ancillary and laboratory) are listed below for reference.     Microbiology: Recent Results (from the past 240 hour(s))  MRSA Next Gen by PCR, Nasal     Status: None   Collection Time: 06/28/21  5:18 PM   Specimen: Nasal Mucosa; Nasal Swab  Result Value Ref Range Status   MRSA by PCR Next Gen NOT DETECTED NOT DETECTED Final    Comment: (NOTE) The GeneXpert MRSA Assay (FDA approved for NASAL specimens only), is one component of a comprehensive MRSA colonization surveillance program. It is not intended to diagnose MRSA infection nor to guide or monitor treatment for MRSA infections. Test performance is not FDA approved in patients less than 40 years old. Performed at [facility], [address] [place], [place] [address]   SARS CORONAVIRUS 2 (TAT 6-24 HRS) Nasopharyngeal Nasopharyngeal Swab     Status: None   Collection Time: 07/06/21  3:28 PM   Specimen: Nasopharyngeal Swab  Result Value Ref  Range Status   SARS Coronavirus 2 NEGATIVE NEGATIVE Final    Comment: (NOTE) SARS-CoV-2 target nucleic acids are NOT DETECTED.  The SARS-CoV-2 RNA is generally detectable in upper and lower respiratory specimens during the acute phase of infection. Negative results do not preclude SARS-CoV-2 infection, do not rule out co-infections with other pathogens, and should not be used as the sole basis for treatment or other patient management decisions. Negative results must be combined with clinical observations, patient history, and epidemiological information. The expected result is Negative.  Fact Sheet for Patients: SugarRoll.be  Fact Sheet for Healthcare Providers: https://www.woods-mathews.com/  This test is not yet approved or cleared by the [place] FDA and  has been authorized for detection and/or diagnosis of SARS-CoV-2 by FDA under an Emergency Use Authorization (EUA). This EUA will remain  in effect (meaning this test can be used) for the duration of the COVID-19 declaration under Se ction 564(b)(1) of the Act, 21 U.S.C. section 360bbb-3(b)(1), unless the authorization is terminated or revoked sooner.  Performed at [facility], [place] [address] [place], [place] [address]      Labs:  CBC: Recent Labs  Lab 07/06/21 1221 07/07/21 1004 07/08/21 0314  WBC 18.2* 12.7* 12.8*  NEUTROABS 14.8*  --   --   HGB 13.1 12.7* 12.2*  HCT 38.6* 36.2* 35.1*  MCV 86.4 83.8 84.6  PLT 306 264 254   BMP &GFR Recent Labs  Lab 07/06/21 1221 07/07/21 1004 07/08/21 0314  NA 129* 131* 131*  K 3.7 4.1 4.2  CL 96* 96* 97*  CO2 25 28 28   GLUCOSE 161* 103* 149*  BUN 30* 26* 27*  CREATININE 0.77 0.74 0.88  CALCIUM 8.3* 7.9* 8.2*  MG  --  2.0 2.2  PHOS  --  4.5 4.2   Estimated Creatinine Clearance: 87.4 mL/min (by C-G formula based on SCr of 0.88 mg/dL). Liver & Pancreas: Recent Labs  Lab 07/06/21 1221 07/07/21 1004  07/08/21 0314  AST 28  --   --   ALT 84*  --   --   ALKPHOS 46  --   --   BILITOT 0.7  --   --   PROT 5.8*  --   --   ALBUMIN 3.1* 2.7* 2.6*   No results for input(s): LIPASE, AMYLASE in the last 168 hours. Recent Labs  Lab 07/08/21 0314  AMMONIA 12   Diabetic: Recent Labs    07/07/21 0436  HGBA1C 6.2*   Recent Labs  Lab 07/07/21 2122 07/08/21 0113 07/08/21 0416 07/08/21 0620 07/08/21 0825  GLUCAP 164* 141* 153* 123* 104*   Cardiac Enzymes: Recent Labs  Lab 07/08/21 0314  CKTOTAL 12*   No results for input(s): PROBNP in the last 8760 hours. Coagulation Profile: No results for input(s): INR, PROTIME in the last 168 hours. Thyroid Function Tests: Recent Labs    07/06/21 1221 07/06/21 1427  TSH 0.057*  --   FREET4  --  1.43*  T3FREE  --  1.7*   Lipid Profile: Recent Labs    07/07/21 0436  CHOL 108  HDL 48  LDLCALC 53  TRIG 35  CHOLHDL 2.3   Anemia Panel: Recent Labs    07/07/21 1200  VITAMINB12 585   Urine analysis:    Component Value Date/Time   COLORURINE YELLOW 01/28/2020 1051   APPEARANCEUR CLOUDY (A) 01/28/2020 1051   LABSPEC 1.025 01/28/2020 1051   PHURINE 5.5 01/28/2020 1051   GLUCOSEU NEGATIVE 01/28/2020 1051   HGBUR NEGATIVE 01/28/2020 1051   BILIRUBINUR SMALL (A) 01/28/2020 1051   BILIRUBINUR negative 01/28/2020 1044   KETONESUR TRACE (A) 01/28/2020 1051   PROTEINUR Positive (A) 01/28/2020 1044   UROBILINOGEN 0.2 01/28/2020 1051   UROBILINOGEN negative (A) 01/28/2020 1044   NITRITE NEGATIVE 01/28/2020 1051   NITRITE negative 01/28/2020 1044   LEUKOCYTESUR NEGATIVE 01/28/2020 1051   LEUKOCYTESUR Negative 01/28/2020 1044   Sepsis Labs: Invalid input(s): PROCALCITONIN, LACTICIDVEN   Time coordinating discharge: 45 minutes  SIGNED:  Mercy Riding, MD  [facility] 07/08/2021, 3:40 PM  If 7PM-7AM, please contact night-coverage [email or web address]

## 2021-07-08 NOTE — Progress Notes (Deleted)
STROKE TEAM PROGRESS NOTE    INTERVAL HISTORY His wife is at the bedside.  He appears more bright today than prior evaluation. They are eager to go home.   Vitals:   07/07/21 2023 07/08/21 0825 07/08/21 0830 07/08/21 1212  BP:  (!) 161/87  (!) 177/82  Pulse:  (!) 51  (!) 49  Resp:  17  18  Temp:  98.3 F (36.8 C)  98.3 F (36.8 C)  TempSrc:  Oral  Oral  SpO2: 96% 98% 98% 100%   CBC:  Recent Labs  Lab 07/06/21 1221 07/07/21 1004 07/08/21 0314  WBC 18.2* 12.7* 12.8*  NEUTROABS 14.8*  --   --   HGB 13.1 12.7* 12.2*  HCT 38.6* 36.2* 35.1*  MCV 86.4 83.8 84.6  PLT 306 264 102    Basic Metabolic Panel:  Recent Labs  Lab 07/07/21 1004 07/08/21 0314  NA 131* 131*  K 4.1 4.2  CL 96* 97*  CO2 28 28  GLUCOSE 103* 149*  BUN 26* 27*  CREATININE 0.74 0.88  CALCIUM 7.9* 8.2*  MG 2.0 2.2  PHOS 4.5 4.2     Lipid Panel:  Recent Labs  Lab 07/07/21 0436  CHOL 108  TRIG 35  HDL 48  CHOLHDL 2.3  VLDL 7  LDLCALC 53     HgbA1c:  Recent Labs  Lab 07/07/21 0436  HGBA1C 6.2*    Urine Drug Screen: No results for input(s): LABOPIA, COCAINSCRNUR, LABBENZ, AMPHETMU, THCU, LABBARB in the last 168 hours.  Alcohol Level No results for input(s): ETH in the last 168 hours.  IMAGING past 24 hours No results found.  PHYSICAL EXAM   NEURO: Mental Status: lethargic but attempts to keep eyes open throughout exam. Follows some commands and not others. Oriented only to person at this time. Not oriented to place, month, day, date, or year. L  Speech/Language: speech is without dysarthria or aphasia. Word salad noted. He is able to name pen, and does recognize wife at bedside.  Cranial Nerves: II: PERRL 4 mm/brisk. visual fields full. III, IV, VI: OS ptosis chronic. Eyes dysconjugate. . V: sensation is intact and symmetrical to face. VII: Smile is symmetrical. Able to puff cheeks and raise eyebrows. VIII:hearing intact to voice. IX, X: palate elevation is symmetric. Phonation  normal. XI: normal sternocleidomastoid and trapezius muscle strength. VOZ:DGUYQI is symmetrical without fasciculations.    Motor:  All muscle groups are 5/5 with exception of LUE triceps 4/5, Tone is normal. Bulk is decreased throughout. Sensation- Intact to light touch bilaterally in all four extremities. Extinction absent to light touch to DSS. Coordination: FTN intact bilaterally. Does not comprehend HKS. No pronator drift. DTRs:  2+ throughout.   ASSESSMENT/PLAN [name] is a 65 y.o. 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 Marcello Moores of neurosurgery, hypertension, hyperlipidemia, GERD, hypothyroidism who presented to [facility] ED from home due to confusion, gait abnormality and word salad.     CT head showed left temporal lobe infarct.  MRI with and without contrast showed left temporal lobe large infarct, but also increased size of right carotid and right basal ganglia lesions, however, the right pons enhancing lesion has resolved, concerning for previous subacute infarct.  EEG negative for seizure, LE venous Doppler negative for DVT, EF 55 to 60%.  A1c 6.2, LDL 53, creatinine 0.77, sodium 129.  However, CT head and neck showed severe stenosis versus short segment occlusion of distal left M1, severe stenosis versus occlusion of the right ICA terminus, severe stenosis right M1 and proximal right M2, very small vertebrobasilar system with bilateral fetal PCAs, multifocal moderate to severe stenosis of small left PCA and severe stenosis of distal right P2, moderate bilateral paraclinoid ICA stenosis.  Neurology  was asked to evaluate for new infarct versus evolution of brain lesions.   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  The etiology for patient diffuse  intracranial vasculopathy not quite clear, concerning for brain radiation related, however diffuse atherosclerosis also possible.  Given his previous " bleeding disorder", recommend aspirin 81 (instead of ASA 325) and Plavix 75 for 3 months and then aspirin alone.  Continue Vytorin and Keppra.  PT/OT/speech.  However, patient has high risk of recurrent stroke given severe stenosis/occlusion of intracranial vasculature. We will follow.  Neurosurgery ( Dr. Marcello Moores) has seen patient during this admission and will continue to monitor; he had recent brain biopsy of lesions and results are pending.    Stroke:  left temporal lobe : workup this admission     CT head shows new hypodensity in left temporal lobe. 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     CTA head    1. Severe stenosis versus short-segment occlusion of the distal left M1 MCA. 2. Severe stenosis versus occlusion of the right ICA terminus. Irregular opacification of a severely narrowed right M1 MCA with opacification of proximal M2 MCA branches. Small, irregular right A1 ACA as well. 3. Motion limited evaluation with very small vertebrobasilar system with bilateral posterior communicating arteries largely supplying the PCAs. Poor visualization of a small right intradural vertebral artery and only minimal opacification of a very small basilar artery. Focal loss of opacification of the distal left vertebral artery may represent a superimposed severe stenosis or occlusion. The small left vertebral artery appears to largely terminate as PICA. 4. Suspected multifocal moderate to severe stenosis of the small left PCA and severe stenosis of the distal right P2 PCA. 5. Moderate bilateral paraclinoid ICA stenosis.  CTA neck    suspected severe stenosis of the left vertebral artery origin. More distal vertebral arteries are patent in the neck. 2. No significant (greater than 50%) stenosis of the carotid arteries in the  neck.  MRI    1. The hypoattenuation in the left temporal lobe seen on same day CT head correlates with nonenhancing restricted diffusion, compatible with acute infarct. Associated edema and local sulcal effacement. Additional small nonenhancing focus of restricted diffusion in the right paramidline pons likely represents an additional acute infarct 2. Increased size/bulk of expansile, heterogeneously enhancing masses in the right caudate and smaller lesion in the adjacent posterior limb of the internal capsule, as described above. 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 Atypical infection is considered less likely. Recommend correlation with pathology results of the recent biopsy. 3. The previously seen area of enhancement in the right pons has resolved with focal T2 hyperintensity in this region. Given the apparent resolution of enhancement, this area may have previously represented an enhancing subacute infarct, but this warrants continued attention on follow-up.  Carotid Doppler  done and results are pending   2D Echo 55-60%EF, mod dilated LA, no IA shunt    LDL 53 HgbA1c 6.2 VTE prophylaxis - lovenox    Diet   Diet regular Room  service appropriate? Yes; Fluid consistency: Thin   No antithrombotic prior to admission, now on aspirin 81 mg daily and clopidogrel 75 mg daily for 3 months then aspirin 81 daily.  Therapy recommendations:  outpatient PT Disposition:  home  Plan to start patient on low dose aspirin and plavix  for 3 months, followed by aspirin only.  No further inpatient stroke workup indicated and we will sign off at this time but we remain available to assist. Follow up with [facility] as ordered.   Hyperlipidemia LDL 53, goal < 70  High intensity statin zocor 40mg  daily Continue statin at discharge  Diabetes type II  Controlled HgbA1c 6.2, goal <  7.0 CBGs Recent Labs    07/08/21 0416 07/08/21 0620 07/08/21 0825  GLUCAP 153* 123* 104*       Other Stroke Risk Factors  History of brain lesions with XRT History of prostate cancer post prostatectomy    Other Active Problems Leukocytosis  GERD  Hospital day # 1     To contact Stroke Continuity provider, please refer to [email or web address] After hours, contact General Neurology

## 2021-07-09 ENCOUNTER — Telehealth: Payer: Self-pay

## 2021-07-09 NOTE — Telephone Encounter (Signed)
Spoke with wife today and info given. ?

## 2021-07-09 NOTE — Telephone Encounter (Signed)
Hold aspirin and plavix today and start tomorrow.  Continue applying pressure to area to stop bleeding

## 2021-07-11 ENCOUNTER — Encounter: Payer: Self-pay | Admitting: Internal Medicine

## 2021-07-11 ENCOUNTER — Other Ambulatory Visit: Payer: Self-pay

## 2021-07-11 ENCOUNTER — Ambulatory Visit: Payer: 59 | Admitting: Internal Medicine

## 2021-07-11 VITALS — BP 158/90 | HR 80 | Temp 98.2°F | Ht 66.0 in | Wt 194.8 lb

## 2021-07-11 DIAGNOSIS — I10 Essential (primary) hypertension: Secondary | ICD-10-CM | POA: Diagnosis not present

## 2021-07-11 DIAGNOSIS — E785 Hyperlipidemia, unspecified: Secondary | ICD-10-CM

## 2021-07-11 DIAGNOSIS — I63512 Cerebral infarction due to unspecified occlusion or stenosis of left middle cerebral artery: Secondary | ICD-10-CM

## 2021-07-11 DIAGNOSIS — E119 Type 2 diabetes mellitus without complications: Secondary | ICD-10-CM

## 2021-07-11 DIAGNOSIS — E039 Hypothyroidism, unspecified: Secondary | ICD-10-CM

## 2021-07-11 NOTE — Progress Notes (Signed)
Subjective:    Patient ID: Adam Sullivan, male    DOB: 21-Sep-1956, 65 y.o.   MRN: [id number]  HPI The patient is here for follow up from the hospital.  Admitted 7/8-7/10  He had a stereotactic biopsy of one of his brain lesions on 6/30.  He presented to the emergency room 7/8 with confusion, gait disturbance, weakness and word salad.  He was admitted for an acute CVA-MRI showed anterior left temporal lobe acute or subacute infarct and increased size/bulk of right caudate and posterior limb of IC lesions.  Neurology and neurosurgery saw patient.  CTA head and neck showed multiple intracranial stenosis.  TTE done, lower extremity ultrasound negative for DVT.  EEG negative for seizure.  TSH was slightly low.  Neurology recommended Plavix and aspirin for 3 months then aspirin alone.  Permissive hypertension-SBP 130-160.  Synthroid decreased to 75 mcg.  Needs repeat TSH.  On Decadron taper.  Lisinopril discontinued.  Started on low-dose metformin.   He saw neurosurgery-there is no biopsy results yet.    He saw speech - he does cough and some choking with eating.  His wife thinks he eats too fast some times.    No sensation of urination or stooling - even after going he does not always know he went.  His wife has to remind him to go to the bathroom.    Snoring has changed - drooling - getting wet.    He is having increased confusion.   Medications and allergies reviewed with patient and updated if appropriate.  Patient Active Problem List   Diagnosis Date Noted   Acute CVA (cerebrovascular accident) ([place]) 07/07/2021   CVA (cerebral vascular accident) ([place]) 07/06/2021   Brain lesion 06/28/2021   Right frontal lobe lesion 06/28/2021   Brain mass 05/31/2021   History of CVA (cerebrovascular accident) 03/08/2020   Fatigue 02/10/2020   Memory loss 02/10/2020   Depression 02/10/2020   Tinea cruris 01/28/2020   Left shoulder tendinitis 10/13/2018   Gait abnormality 09/21/2018   Poor  short-term memory 09/21/2018   Drooping eyelid disease, left 09/21/2018   History of prostate cancer 09/23/2016   Essential hypertension 09/23/2016   Diabetes mellitus without complication ([place]) 54/27/0623   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/2011   GERD (gastroesophageal reflux disease) 06/04/2011   Dyslipidemia 06/04/2011   Headache disorder 06/04/2011   Hypothyroidism 03/18/2008   Asthma 03/18/2008    Current Outpatient Medications on File Prior to Visit  Medication Sig Dispense Refill   ADVAIR DISKUS 250-50 MCG/DOSE AEPB USE 1 PUFF EVERY 12 HOURS. 60 each 0   albuterol (PROAIR HFA) 108 (90 Base) MCG/ACT inhaler Inhale 2 puffs into the lungs every 6 (six) hours as needed for shortness of breath or wheezing.     aspirin EC 81 MG EC tablet Take 1 tablet (81 mg total) by mouth daily. Swallow whole. 90 tablet 1   cetirizine (ZYRTEC) 10 MG tablet Take 10 mg by mouth in the morning.     clopidogrel (PLAVIX) 75 MG tablet Take 1 tablet (75 mg total) by mouth daily. 90 tablet 0   dexamethasone (DECADRON) 2 MG tablet Take 1 tablet (2 mg total) by mouth 3 (three) times daily for 1 day, THEN 1 tablet (2 mg total) 2 (two) times daily for 3 days, THEN 0.5 tablets (1 mg total) daily for 3 days. 10.5 tablet 0   ezetimibe-simvastatin (VYTORIN) 10-40 MG tablet Take 1 tablet by mouth at bedtime. 90 tablet  1   hydrochlorothiazide (HYDRODIURIL) 25 MG tablet Take 1 tablet (25 mg total) by mouth daily. 90 tablet 1   metFORMIN (GLUCOPHAGE) 500 MG tablet Take 1 tablet (500 mg total) by mouth daily with breakfast for 15 days, THEN 1 tablet (500 mg total) 2 (two) times daily with a meal. 165 tablet 0   montelukast (SINGULAIR) 10 MG tablet Take 1 tablet (10 mg total) by mouth at bedtime. 90 tablet 1   Multiple Vitamins-Minerals (CENTRUM SILVER 50+MEN PO) Take 1 tablet by mouth every evening.     pantoprazole (PROTONIX) 40 MG tablet Take 1 tablet (40 mg total) by mouth daily. 90 tablet 1    SYNTHROID 75 MCG tablet Take 1 tablet (75 mcg total) by mouth daily. 90 tablet 1   vitamin B-12 (CYANOCOBALAMIN) 500 MCG tablet Take 500 mcg by mouth every evening.     No current facility-administered medications on file prior to visit.    Past Medical History:  Diagnosis Date   Allergy    Anxiety    Arthritis    Asthma    severe, Dr. Linna Darner   Blood clots in brain    history of    Brain tumor ([place]) 12/31/1991   Depression    situational   Dyspnea    GERD (gastroesophageal reflux disease)    severe   Headache(784.0)    Hyperlipidemia    Hypothyroidism    Orchalgia    Pneumonia    hx of   Pre-diabetes    Prostate cancer ([place]) 07/01/2013   gleason 6   Thyroid disease    hypothyroidism    Past Surgical History:  Procedure Laterality Date   APPLICATION OF CRANIAL NAVIGATION Right 06/28/2021   Procedure: APPLICATION OF CRANIAL NAVIGATION;  Surgeon: Vallarie Mare, MD;  Location: [facility];  Service: Neurosurgery;  Laterality: Right;   brain shunt  12/31/1991   put in, removed, put in again   [place] Right 06/28/2021   Procedure: STEREOTACTIC FRONTAL BIOPSY OF CAUDATE LESION;  Surgeon: Vallarie Mare, MD;  Location: [facility];  Service: Neurosurgery;  Laterality: Right;   LYMPHADENECTOMY Bilateral 10/22/2013   Procedure: LYMPHADENECTOMY;  Surgeon: Alexis Frock, MD;  Location: [facility];  Service: Urology;  Laterality: Bilateral;   removal brain tumor  12/31/1991   pineal tumor, benign   removed blood clot     X 2   ROBOT ASSISTED LAPAROSCOPIC RADICAL PROSTATECTOMY N/A 10/22/2013   Procedure: ROBOTIC ASSISTED LAPAROSCOPIC RADICAL PROSTATECTOMY   PRE- PERITONEAL APPROACH;  Surgeon: Alexis Frock, MD;  Location: [facility];  Service: Urology;  Laterality: N/A;  3.5 HRS     Social History   Socioeconomic History   Marital status: Married    Spouse name: Not on file   Number of children: Not on file    Years of education: Not on file   Highest education level: Not on file  Occupational History   Not on file  Tobacco Use   Smoking status: Never   Smokeless tobacco: Never  Vaping Use   Vaping Use: Never used  Substance and Sexual Activity   Alcohol use: Yes    Comment: Rarely   Drug use: No   Sexual activity: Not Currently  Other Topics Concern   Not on file  Social History Narrative   Exercise: none   Social Determinants of Health   Financial Resource Strain: Not on file  Food  Insecurity: Not on file  Transportation Needs: Not on file  Physical Activity: Not on file  Stress: Not on file  Social Connections: Not on file    Family History  Problem Relation Age of Onset   Cancer Brother        anal cancer   Asthma Maternal Uncle    Cancer Father        prostate  cancer   Testicular cancer Father    Coronary artery disease Mother 4       5 vessel CABG   Colon cancer Neg Hx    Stomach cancer Neg Hx     Review of Systems  Constitutional:  Negative for fever.  Respiratory:  Negative for cough, shortness of breath and wheezing.   Cardiovascular:  Positive for leg swelling. Negative for chest pain and palpitations.  Neurological:  Positive for dizziness (occ), light-headedness and headaches (a little - on top of head). Negative for numbness.  Psychiatric/Behavioral:  Positive for confusion.       Objective:   Vitals:   07/11/21 1518  BP: (!) 158/90  Pulse: 80  Temp: 98.2 F (36.8 C)  SpO2: 99%   BP Readings from Last 3 Encounters:  07/11/21 (!) 158/90  07/08/21 (!) 177/82  06/29/21 (!) 148/73   Wt Readings from Last 3 Encounters:  07/11/21 194 lb 12.8 oz (88.4 kg)  06/28/21 185 lb 3 oz (84 kg)  06/26/21 185 lb (83.9 kg)   Body mass index is 31.44 kg/m.   Physical Exam    Constitutional: Appears well-developed and well-nourished. No distress.  HENT:  Head: Normocephalic and atraumatic.  Neck: Neck supple. No tracheal deviation present. No  thyromegaly present.  No cervical lymphadenopathy Cardiovascular: Normal rate, regular rhythm and normal heart sounds.   No murmur heard. 1+ b/l LE edema Pulmonary/Chest: Effort normal and breath sounds normal. No respiratory distress. No has no wheezes. No rales.  Skin: Skin is warm and dry. Not diaphoretic.  Psychiatric: Normal mood and affect. Behavior is normal.      Assessment & Plan:    Blood work recommended upon d/c but after discussing with him and his wife we have decided to hold off on blood work.    See Problem List for Assessment and Plan of chronic medical problems.    This visit occurred during the SARS-CoV-2 public health emergency.  Safety protocols were in place, including screening questions prior to the visit, additional usage of staff PPE, and extensive cleaning of exam room while observing appropriate contact time as indicated for disinfecting solutions.

## 2021-07-11 NOTE — Assessment & Plan Note (Signed)
Acute ? Residual - difficult to know - has several neurological deficits/effects - hard to know if it is related to the CVA or tumors ASA and plavix 3 three months, then ASA alone Has neuro appt

## 2021-07-11 NOTE — Assessment & Plan Note (Signed)
Chronic Sugars increased recently due to steroid Started on metformin 500 mg daily - will soon increase to bid

## 2021-07-11 NOTE — Patient Instructions (Addendum)
    Medications changes include :   none   Your prescription(s) have been submitted to your pharmacy. Please take as directed and contact our office if you believe you are having problem(s) with the medication(s).    Follow up in October as scheduled

## 2021-07-11 NOTE — Assessment & Plan Note (Signed)
Chronic Continue vytorin 10-40 g daily

## 2021-07-11 NOTE — Assessment & Plan Note (Signed)
Chronic BP elevated but he had a recent CVA and was found to have several cerebral arteries that were stenosed -- needs permissive htn SBP goal 130-160 Continue hctz 25 mg qd

## 2021-07-11 NOTE — Assessment & Plan Note (Signed)
Chronic  continue synthroid 75 mcg daily

## 2021-07-13 LAB — SURGICAL PATHOLOGY

## 2021-07-17 ENCOUNTER — Encounter (HOSPITAL_COMMUNITY): Payer: Self-pay

## 2021-07-18 ENCOUNTER — Ambulatory Visit: Payer: 59 | Attending: Neurosurgery | Admitting: Physical Therapy

## 2021-07-18 ENCOUNTER — Other Ambulatory Visit: Payer: Self-pay

## 2021-07-18 ENCOUNTER — Encounter: Payer: Self-pay | Admitting: Physical Therapy

## 2021-07-18 ENCOUNTER — Ambulatory Visit: Payer: 59 | Admitting: Occupational Therapy

## 2021-07-18 ENCOUNTER — Ambulatory Visit: Payer: 59 | Admitting: Radiation Oncology

## 2021-07-18 ENCOUNTER — Telehealth: Payer: Self-pay | Admitting: Internal Medicine

## 2021-07-18 ENCOUNTER — Encounter: Payer: Self-pay | Admitting: Occupational Therapy

## 2021-07-18 ENCOUNTER — Ambulatory Visit: Payer: 59

## 2021-07-18 DIAGNOSIS — R41841 Cognitive communication deficit: Secondary | ICD-10-CM

## 2021-07-18 DIAGNOSIS — R41840 Attention and concentration deficit: Secondary | ICD-10-CM | POA: Diagnosis present

## 2021-07-18 DIAGNOSIS — R600 Localized edema: Secondary | ICD-10-CM | POA: Diagnosis present

## 2021-07-18 DIAGNOSIS — R4701 Aphasia: Secondary | ICD-10-CM | POA: Insufficient documentation

## 2021-07-18 DIAGNOSIS — R262 Difficulty in walking, not elsewhere classified: Secondary | ICD-10-CM | POA: Diagnosis not present

## 2021-07-18 DIAGNOSIS — R471 Dysarthria and anarthria: Secondary | ICD-10-CM | POA: Insufficient documentation

## 2021-07-18 DIAGNOSIS — N3942 Incontinence without sensory awareness: Secondary | ICD-10-CM | POA: Diagnosis present

## 2021-07-18 DIAGNOSIS — M6281 Muscle weakness (generalized): Secondary | ICD-10-CM | POA: Insufficient documentation

## 2021-07-18 DIAGNOSIS — R2689 Other abnormalities of gait and mobility: Secondary | ICD-10-CM | POA: Diagnosis present

## 2021-07-18 NOTE — Therapy (Signed)
[facility] [address] [place], [place], [address] Phone: [phone]   Fax:  [phone]  Physical Therapy Evaluation  Patient Details  Name: Adam Sullivan MRN: [id number] Date of Birth: November 25, 1956 Referring Provider (PT): Vallarie Mare, MD   Encounter Date: 07/18/2021   PT End of Session - 07/18/21 1531     Visit Number 1    Number of Visits 12    Date for PT Re-Evaluation 08/29/21    Authorization Type U[employer] Medicare    PT Start Time 3016    PT Stop Time 0109    PT Time Calculation (min) 40 min    Equipment Utilized During Treatment Gait belt    Activity Tolerance Patient tolerated treatment well;Patient limited by fatigue    Behavior During Therapy [facility] for tasks assessed/performed             Past Medical History:  Diagnosis Date   Allergy    Anxiety    Arthritis    Asthma    severe, Dr. Linna Darner   Blood clots in brain    history of    Brain tumor ([place]) 12/31/1991   Depression    situational   Dyspnea    GERD (gastroesophageal reflux disease)    severe   Headache(784.0)    Hyperlipidemia    Hypothyroidism    Orchalgia    Pneumonia    hx of   Pre-diabetes    Prostate cancer ([place]) 07/01/2013   gleason 6   Thyroid disease    hypothyroidism    Past Surgical History:  Procedure Laterality Date   APPLICATION OF CRANIAL NAVIGATION Right 06/28/2021   Procedure: APPLICATION OF CRANIAL NAVIGATION;  Surgeon: Vallarie Mare, MD;  Location: [place];  Service: Neurosurgery;  Laterality: Right;   brain shunt  12/31/1991   put in, removed, put in again   [place] Right 06/28/2021   Procedure: STEREOTACTIC FRONTAL BIOPSY OF CAUDATE LESION;  Surgeon: Vallarie Mare, MD;  Location: [facility];  Service: Neurosurgery;  Laterality: Right;   LYMPHADENECTOMY Bilateral 10/22/2013   Procedure: LYMPHADENECTOMY;  Surgeon: Alexis Frock, MD;   Location[facility];  Service: Urology;  Laterality: Bilateral;   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    Subjective Assessment - 07/18/21 1538     Subjective Pt reports not being pleased after returning home due to using shower bench for safe shower transfers. Pt uses walking stick where footing is uneven and will also hold on to wife's hand for added stability. Prior to hospitalization he was using the walking stick for outdoor use. Does not use in the house. Pt reports having increased need for urination due to medications to help with edema/swelling. Since hospitalization he has been lacking awareness for urination and bowel movements. More pronounced difference in his walk in the last few months. Reports bilat foot numbness. Was able to drive and walk around the store but now is limited due to fatigue and weakness. Reports 10-15 min naps frequently throughout  the day (this has been occurring even prior to hospitalization). Pt reports mild intermittent dizziness.    Pertinent History L temporal lobe and small R paramidline pons infarct 7/8, lesion on posterior limb of internal capsule    Limitations Lifting;Standing;House hold activities    How long can you sit comfortably? n/a    How long can you stand comfortably? n/a    How long can you walk comfortably? Around the house only    Diagnostic tests 7/8 MRI: 1. The hypoattenuation in the left temporal lobe seen on same day CT  head correlates with nonenhancing restricted diffusion, compatible  with acute infarct. Associated edema and local sulcal effacement.  Additional small nonenhancing focus of  restricted diffusion in the  right paramidline pons likely represents an additional acute infarct  2. 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    Patient Stated Goals Improve safety with environment (indoors and outdoors), safety with steps, wife's goals to improve endurance to get around a store    Currently in Pain? No/denies                [facility] PT Assessment - 07/18/21 1532       Assessment   Medical Diagnosis R pons infarct; G93.9 (ICD-10-CM) - Brain lesion    Referring Provider (PT) Vallarie Mare, MD    Onset Date/Surgical Date 07/06/21    Prior Therapy Acute PT      Precautions   Precautions Fall      Restrictions   Weight Bearing Restrictions No      Balance Screen   Has the patient fallen in the past 6 months Yes    How many times? 1-2   sliding out of bed; maybe one more occasion where he tripped and fell at home   Has the patient had a decrease in activity level because of a fear of falling?  Yes    Is the patient reluctant to leave their home because of a fear of falling?  Yes      [place] Private residence    Living Arrangements Spouse/significant other    Available Help at Discharge Family    Type of [place] to enter    Entrance Stairs-Number of Steps 5    Entrance Stairs-Rails Can reach both    [place] One level    [place] bench;Grab bars - tub/shower   walking stick, suction cup grab bar     Prior Function   Level of Independence Independent    Vocation Retired    Leisure watching TV, going out to eat      Cognition   Overall Cognitive Status Impaired/Different from baseline   See OT and SLP notes     Observation/Other Assessments   Focus on Therapeutic  Outcomes (FOTO)  n/a      Observation/Other Assessments-Edema    Edema --   +2 to +3 pitting edema bilat LEs     Sensation   Light Touch Appears Intact      Coordination   Gross Motor Movements are Fluid and Coordinated Yes      ROM / Strength   AROM / PROM / Strength Strength;AROM      Strength   Overall Strength  Comments grossly 4+/5 bilat LEs with MMT    Strength Assessment Site Hip;Knee    Right/Left Hip Right;Left    Right/Left Knee Right;Left      Transfers   Five time sit to stand comments  15 sec      Ambulation/Gait   Ambulation Distance (Feet) 580 Feet    Assistive device --   Walking stick   Gait Pattern Step-through pattern;Step-to pattern;Decreased step length - left;Decreased dorsiflexion - left;Left foot flat;Abducted - left;Abducted- right;Poor foot clearance - left    Ambulation Surface Level;Indoor    Gait velocity 1.6 ft/sec    Stairs Yes    Stairs Assistance 4: Min guard    Stair Management Technique Two rails;Alternating pattern   slow   Number of Stairs 4    Height of Stairs 6      6 Minute walk- Post Test   6 Minute Walk Post Test yes    Modified Borg Scale for Dyspnea 2- Mild shortness of breath      6 minute walk test results    Aerobic Endurance Distance Walked 580    Endurance additional comments 3 short standing rest breaks      Standardized Balance Assessment   Standardized Balance Assessment Berg Balance Test      Berg Balance Test   Sit to Stand Able to stand without using hands and stabilize independently    Standing Unsupported Able to stand safely 2 minutes    Sitting with Back Unsupported but Feet Supported on Floor or Stool Able to sit safely and securely 2 minutes    Stand to Sit Controls descent by using hands    Transfers Able to transfer safely, minor use of hands    Standing Unsupported with Eyes Closed Able to stand 10 seconds safely    Standing Unsupported with Feet Together Able to place feet together independently and  stand 1 minute safely    From Standing, Reach Forward with Outstretched Arm Can reach forward >5 cm safely (2")    From Standing Position, Pick up Object from Floor Able to pick up shoe, needs supervision    From Standing Position, Turn to Look Behind Over each Shoulder Looks behind from both sides and weight shifts well    Turn 360 Degrees Able to turn 360 degrees safely but slowly    Standing Unsupported, Alternately Place Feet on Step/Stool Able to stand independently and complete 8 steps >20 seconds    Standing Unsupported, One Foot in Front Able to plae foot ahead of the other independently and hold 30 seconds    Standing on One Leg Tries to lift leg/unable to hold 3 seconds but remains standing independently    Total Score 45    Berg comment: <=45/56 indicates high fall risk                        Objective measurements completed on examination: See above findings.               PT Education - 07/18/21 1638     Education Details Discussed with pt's wife changing POC as needed. Discussed exam findings and POC.    Person(s) Educated Patient    Methods Explanation    Comprehension Verbalized understanding;Verbal cues required;Tactile cues required;Need further instruction                 PT Long Term Goals - 07/18/21 1647       PT LONG TERM  GOAL #1   Title Pt will be compliant with HEP with assist from his wife    Time 6    Period Weeks    Status New    Target Date 08/29/21      PT LONG TERM GOAL #2   Title Pt will be able to improve Berg Balance Score to at least 48/56 to demo decreasing fall risk    Time 6    Period Weeks    Status New    Target Date 08/29/21      PT LONG TERM GOAL #3   Title Pt will be able to increase 6 MWT to at least 700' for improved community mobility    Baseline 580    Time 6    Period Weeks    Status New    Target Date 08/29/21      PT LONG TERM GOAL #4   Title Pt will have improved 5x STS <13 sec     Baseline 15 sec    Time 6    Period Weeks    Status New    Target Date 08/29/21      PT LONG TERM GOAL #5   Title PT will administer FGA and add goal as needed                    Plan - 07/18/21 1640     Clinical Impression Statement [name] is a 65 y/o M presenting to OPPT s/p CVA 07/06/21 with finding of brain lesion on MRI. Biopsy performed and are awaiting results. On assessment, pt demos decreased ankle AROM due to pitting edema, generalized weakness, decreased activity tolerance/endurance, and decreased balance affecting pt's functional mobility with home and community tasks. Pt would benefit from PT to optimize his level of function with his current diagnosis. May benefit from pelvic health PT to address his urinary frequency.    Personal Factors and Comorbidities Age;Fitness;Time since onset of injury/illness/exacerbation    Examination-Activity Limitations Locomotion Level;Stairs;Lift;Squat;Caring for Others    Examination-Participation Restrictions Cleaning;Community Activity;Driving;Yard Work;Meal Prep    Stability/Clinical Decision Making Evolving/Moderate complexity    Clinical Decision Making Moderate    Rehab Potential Fair    PT Frequency --   1-2x/wk within family's goals and abilities   PT Duration 6 weeks    PT Treatment/Interventions ADLs/Self Care Home Management;Cryotherapy;Moist Heat;DME Instruction;Gait training;Functional mobility training;Stair training;Therapeutic activities;Therapeutic exercise;Balance training;Neuromuscular re-education;Manual techniques;Patient/family education;Energy conservation;Taping;Passive range of motion;Orthotic Fit/Training    PT Next Visit Plan Assess FGA. Work on endurance, general strengthening, and balance/dynamic gait. Rest breaks as needed. Assess for possible AFO on L LE -- may be limited due to edema.    PT Home Exercise Plan Pt to walk 2 min 3x/day    Recommended Other Services Consideration for pelvic health  PT specialist to address urinary frequency    Consulted and Agree with Plan of Care Patient;Family member/caregiver    Family Member Consulted Wife             Patient will benefit from skilled therapeutic intervention in order to improve the following deficits and impairments:  Abnormal gait, Difficulty walking, Decreased endurance, Decreased activity tolerance, Decreased balance, Decreased mobility, Decreased strength, Dizziness  Visit Diagnosis: Difficulty in walking, not elsewhere classified  Other abnormalities of gait and mobility  Muscle weakness (generalized)  Localized edema  Urinary incontinence without sensory awareness     Problem List Patient Active Problem List   Diagnosis Date Noted   Acute CVA (  cerebrovascular accident) ([place]) 07/07/2021   CVA (cerebral vascular accident) ([place]) 07/06/2021   Brain lesion 06/28/2021   Right frontal lobe lesion 06/28/2021   Brain mass 05/31/2021   History of CVA (cerebrovascular accident) 03/08/2020   Fatigue 02/10/2020   Memory loss 02/10/2020   Depression 02/10/2020   Tinea cruris 01/28/2020   Left shoulder tendinitis 10/13/2018   Gait abnormality 09/21/2018   Poor short-term memory 09/21/2018   Drooping eyelid disease, left 09/21/2018   History of prostate cancer 09/23/2016   Essential hypertension 09/23/2016   Diabetes mellitus without complication ([place]) 45/80/9983   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/2011   GERD (gastroesophageal reflux disease) 06/04/2011   Dyslipidemia 06/04/2011   Headache disorder 06/04/2011   Hypothyroidism 03/18/2008   Asthma 03/18/2008    Brisia Schuermann [address] [place] PT, DPT 07/18/2021, 4:53 PM  [facility] [address] [place], [place], [address] Phone: [phone]   Fax:  [phone]  Name: QUANTEZ SCHNYDER MRN: [id number] Date of Birth: 10-10-1956

## 2021-07-18 NOTE — Telephone Encounter (Signed)
Scheduled appointment per 07/20 sch msg. Patient is aware.

## 2021-07-18 NOTE — Therapy (Signed)
[facility] [address] [place], [place], [address] Phone: [phone]   Fax:  [phone]  Speech Language Pathology Evaluation  Patient Details  Name: Adam Sullivan MRN: [id number] Date of Birth: [birth year] Referring Provider (SLP): Charlett Blake (referring); Dr. Mickeal Skinner  (documentation)   Encounter Date: 07/18/2021   End of Session - 07/18/21 1717     Visit Number 1    Number of Visits 25    Date for SLP Re-Evaluation 10/16/21    SLP Start Time 1450    SLP Stop Time  3664    SLP Time Calculation (min) 40 min    Activity Tolerance Patient limited by lethargy;Patient tolerated treatment well             Past Medical History:  Diagnosis Date   Allergy    Anxiety    Arthritis    Asthma    severe, Dr. Linna Darner   Blood clots in brain    history of    Brain tumor ([place]) 12/31/1991   Depression    situational   Dyspnea    GERD (gastroesophageal reflux disease)    severe   Headache(784.0)    Hyperlipidemia    Hypothyroidism    Orchalgia    Pneumonia    hx of   Pre-diabetes    Prostate cancer ([place]) 07/01/2013   gleason 6   Thyroid disease    hypothyroidism    Past Surgical History:  Procedure Laterality Date   APPLICATION OF CRANIAL NAVIGATION Right 06/28/2021   Procedure: APPLICATION OF CRANIAL NAVIGATION;  Surgeon: Vallarie Mare, MD;  Location: [facility];  Service: Neurosurgery;  Laterality: Right;   brain shunt  12/31/1991   put in, removed, put in again   [place] Right 06/28/2021   Procedure: STEREOTACTIC FRONTAL BIOPSY OF CAUDATE LESION;  Surgeon: Vallarie Mare, MD;  Location: [facility];  Service: Neurosurgery;  Laterality: Right;   LYMPHADENECTOMY Bilateral 10/22/2013   Procedure: LYMPHADENECTOMY;  Surgeon: Alexis Frock, MD;  [facility];  Service: Urology;  Laterality: Bilateral;   removal brain tumor  12/31/1991    pineal tumor, benign   removed blood clot     X 2   ROBOT ASSISTED LAPAROSCOPIC RADICAL PROSTATECTOMY N/A 10/22/2013   Procedure: ROBOTIC ASSISTED LAPAROSCOPIC RADICAL PROSTATECTOMY   PRE- PERITONEAL APPROACH;  Surgeon: Alexis Frock, MD;  [facility];  Service: Urology;  Laterality: N/A;  3.5 HRS     There were no vitals filed for this visit.   Subjective Assessment - 07/18/21 1652     Subjective "We got a call from Dr. Susanne Greenhouse office on the way here - he wants to meet with [place] Monday to go over results of biopsy."    Patient is accompained by: Family member   wife Adam Sullivan   Currently in Pain? No/denies                SLP Evaluation [facility] - 07/18/21 1652       SLP Visit Information   SLP Received On 07/18/21    Referring Provider (SLP) Lorin Glass (referring); Dr. Sherrilee Gilles  (documentation)    Onset Date Wife states 2021    Medical Diagnosis R caudate and basal ganglia tumors      Subjective   Patient/Family Stated Goal "Get him any of the help he needs."      General Information   HPI  65 y/o presented to ED on 7/8 with complaints of confusion and "word salad". 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 BSE found to be WNL swallowing with wife cuing pt for small bites/sips. SLE unable to be completed prior to d/c home. Previous admission 6/29-7/1 for progressive neurologic decline and was found to have multiple progressive enhancing lesions in his brain. CT of head revealed progression of R caudate and basal ganglia tumors. Some word salad and memory deficits prior to this hosptiatization. Metastatic work-up was negative. He underwent elective biopsy of the caudate head lesion 6/30. 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 Furhter testing necessary. The first 15 seconds of generating "m" words pt req'd cues to task.    Memory Decreased short-term memory    Memory Comments Very good chance this is attention-based. Pt did not follow instructions on word fluency assessment as he stated words with capital M 40 seconds after being told not to state words with capital M, and pt repeated animal name x1- 10 seconds after saying the first time.    Awareness Comments Intellectual and emergent awareness deficits. Pt with likely cognitive dysphagia, due to overstuffing food in buccal cavities, per wife. SLP told wife to cue pt to take smaller bites and to swallow prior to placing more food into oral cavity.      Auditory Comprehension   Overall Auditory Comprehension Appears within functional limits for tasks assessed      Verbal Expression   Overall Verbal Expression Impaired    Level of Generative/Spontaneous Verbalization Conversation    Naming No impairment    Other Verbal Expression Comments Adam Sullivan exhibits word salad and language of confusion. Often times his response was not answering SLP question. or comment tangential without clear semantic or syntactic organization.      Motor Speech   Overall Motor Speech Impaired    Respiration Within functional limits    Phonation Normal    Resonance Within functional limits    Articulation Impaired    Level of Impairment  Conversation    Intelligibility Intelligible    Motor Speech Errors Unaware                             SLP Education - 07/18/21 1716     Education Details possible goals, eval results, cognitive/attention tasks at home    Person(s) Educated Patient;Spouse    Methods Explanation    Comprehension Verbalized understanding;Need further instruction              SLP Short Term Goals - 07/18/21 1726       SLP SHORT TERM GOAL #1  Title pt will demonstrate sustained attention adequate for 5 minute therapy task x4, x3 sessions    Time 4    Period Weeks    Status New    Target Date 08/17/21      SLP SHORT TERM GOAL #2   Title pt will demonstrate he knows where to place papers in a memory binder with occasional min A x3 sessions.    Time 4    Period Weeks    Status New    Target Date 08/17/21      SLP SHORT TERM GOAL #3   Title pt will complee formal cognitive evaluation    Time 2    Period Weeks    Status New    Target Date 08/03/21      SLP SHORT TERM GOAL #4   Title pt will imitate oral motor tasks to strengthen/coordinate articulators and improve speech clarity 80% success in 3 sessions    Time 6    Period Weeks    Status New    Target Date 08/29/21              SLP Long Term Goals - 07/18/21 1729       SLP LONG TERM GOAL #1   Title pt will demonstrate simple alternating atteniton to read off items on a list or check off items on a list x2 sessions    Time 8    Period Weeks    Status New    Target Date 09/14/21      SLP LONG TERM GOAL #2   Title pt will tell SLP 2 cognitive deficits with modified independence (notes in memory binder)    Time 12    Period Weeks    Status New    Target Date 10/16/21      SLP LONG TERM GOAL #3   Title pt will chart medication, exercise completion, etc in memory binder with occasional min A    Time 12    Period Weeks    Status New    Target Date 10/16/21              Plan - 07/18/21 1722      Clinical Impression Statement Adam Sullivan presents today with cognitive deficits in attention, awareness, mild dysarthria causing listener to pay closer attention to speaker, and also receptive and expressive aphasia including word salad and language of confusion. SLP suspects pt has a cognitive-based dysphagia after consultation with pt's wife about mealtime and snacks. He would benefit from skilled ST to address these difficulties. Pt will consult with Dr. Mickeal Skinner on Monday to review latest biopsy and develop a treatment plan if necessary.    Speech Therapy Frequency 2x / week    Duration 12 weeks    Treatment/Interventions Cognitive reorganization;Internal/external aids;Patient/family education;Compensatory strategies;SLP instruction and feedback;Cueing hierarchy;Functional tasks;Compensatory techniques;Environmental controls;Oral motor exercises    Potential to Achieve Goals Fair    Potential Considerations Medical prognosis;Previous level of function    Consulted and Agree with Plan of Care Patient;Family member/caregiver    Family Member Consulted wife Adam Sullivan             Patient will benefit from skilled therapeutic intervention in order to improve the following deficits and impairments:   Aphasia - Plan: SLP plan of care cert/re-cert  Dysarthria and anarthria - Plan: SLP plan of care cert/re-cert  Cognitive communication deficit - Plan: SLP plan of care cert/re-cert    Problem List Patient Active Problem List   Diagnosis Date  Noted   Acute CVA (cerebrovascular accident) ([place]) 07/07/2021   CVA (cerebral vascular accident) ([place]) 07/06/2021   Brain lesion 06/28/2021   Right frontal lobe lesion 06/28/2021   Brain mass 05/31/2021   History of CVA (cerebrovascular accident) 03/08/2020   Fatigue 02/10/2020   Memory loss 02/10/2020   Depression 02/10/2020   Tinea cruris 01/28/2020   Left shoulder tendinitis 10/13/2018   Gait abnormality 09/21/2018   Poor short-term memory  09/21/2018   Drooping eyelid disease, left 09/21/2018   History of prostate cancer 09/23/2016   Essential hypertension 09/23/2016   Diabetes mellitus without complication ([place]) 88/89/1694   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/2011   GERD (gastroesophageal reflux disease) 06/04/2011   Dyslipidemia 06/04/2011   Headache disorder 06/04/2011   Hypothyroidism 03/18/2008   Asthma 03/18/2008    [facility] ,[place], CCC-SLP  07/18/2021, 5:37 PM  [facility] [address] [place], [place], [address] Phone: [phone]   Fax:  [phone]  Name: Adam Sullivan MRN: [id number] Date of Birth: Mar 15, 1956

## 2021-07-18 NOTE — Patient Instructions (Signed)
Adam Sullivan can engage in simple home tasks to foster attention like sorting silverware, matching socks, sorting clothes, etc

## 2021-07-18 NOTE — Therapy (Signed)
[facility] [address] [place], [place], [address] Phone: [phone]   Fax:  [phone]  Occupational Therapy Evaluation  Patient Details  Name: SUDEEP SCHEIBEL MRN: [id number] Date of Birth: 1956/08/30 Referring Provider (OT): Leonie Man   Encounter Date: 07/18/2021   OT End of Session - 07/18/21 1812     Visit Number 1    Number of Visits 13    Date for OT Re-Evaluation 10/01/21    Authorization Type [employer] - 60 VL; Combined    OT Start Time 1615    OT Stop Time 1700    OT Time Calculation (min) 45 min    Activity Tolerance Patient tolerated treatment well    Behavior During Therapy [facility] for tasks assessed/performed             Past Medical History:  Diagnosis Date   Allergy    Anxiety    Arthritis    Asthma    severe, Dr. Linna Darner   Blood clots in brain    history of    Brain tumor ([place]) 12/31/1991   Depression    situational   Dyspnea    GERD (gastroesophageal reflux disease)    severe   Headache(784.0)    Hyperlipidemia    Hypothyroidism    Orchalgia    Pneumonia    hx of   Pre-diabetes    Prostate cancer ([place]) 07/01/2013   gleason 6   Thyroid disease    hypothyroidism    Past Surgical History:  Procedure Laterality Date   APPLICATION OF CRANIAL NAVIGATION Right 06/28/2021   Procedure: APPLICATION OF CRANIAL NAVIGATION;  Surgeon: Vallarie Mare, MD;  Location: [facility];  Service: Neurosurgery;  Laterality: Right;   brain shunt  12/31/1991   put in, removed, put in again   [place] Right 06/28/2021   Procedure: STEREOTACTIC FRONTAL BIOPSY OF CAUDATE LESION;  Surgeon: Vallarie Mare, MD;  Location: [facility];  Service: Neurosurgery;  Laterality: Right;   LYMPHADENECTOMY Bilateral 10/22/2013   Procedure: LYMPHADENECTOMY;  Surgeon: Alexis Frock, MD;  Location: [facility];  Service: Urology;  Laterality: Bilateral;   removal brain  tumor  12/31/1991   pineal tumor, benign   removed blood clot     X 2   ROBOT ASSISTED LAPAROSCOPIC RADICAL PROSTATECTOMY N/A 10/22/2013   Procedure: ROBOTIC ASSISTED LAPAROSCOPIC RADICAL PROSTATECTOMY   PRE- PERITONEAL APPROACH;  Surgeon: Alexis Frock, MD;  Location: [facility];  Service: Urology;  Laterality: N/A;  3.5 HRS     There were no vitals filed for this visit.   Subjective Assessment - 07/18/21 1758     Subjective  I am weak    Patient is accompanied by: Family member   wife Ardelle Anton   Currently in Pain? No/denies    Pain Score 0-No pain               [facility] OT Assessment - 07/18/21 1800       Assessment   Medical Diagnosis Stroke following brain lesion and biopsy    Referring Provider (OT) Leonie Man    Onset Date/Surgical Date 07/06/21    Hand Dominance Right    Next MD Visit 7/25    Prior Therapy Acute therapy      Precautions   Precautions Fall      Restrictions   Weight Bearing Restrictions No      Prior Function  Level of Independence Independent with basic ADLs    Vocation Retired    Probation officer    Leisure Making omelets on Sunday am      ADL   Eating/Feeding Set up    Grooming Supervision/safety    Upper Body Bathing Minimal assistance   assist for back, reminders for hair   Lower Body Bathing Supervision/safety    Upper Body Dressing Set up    Lower Body Dressing Moderate assistance    Toilet Transfer Supervision/safety    Toileting - Clothing Manipulation Modified independent    [place]   having continence issues   Ambulance person tub bench    ADL comments Wife reports assisting as needed - patient at times less open to suggestions for help, e.g. seated dressing.      Written Expression   Dominant Hand Right      Vision - History   Baseline Vision Wears glasses all the time    Additional Comments Had prior brain  surgery in "90's has visual dyscoord.  Wears glasses with prisms      Vision Assessment   Eye Alignment Impaired (comment)    Vision Assessment Vision impaired  _ to be further tested in functional context    Ocular Range of Motion Restricted on left    Tracking/Visual Pursuits Right eye does not track laterally    Comment Mild extraneous eye movements in right eye with lateral tracking      Activity Tolerance   Activity Tolerance Tolerates < 10 min activity with changes in vital signs    Activity Tolerance Comments Nodding off during evaluation      Cognition   Overall Cognitive Status Impaired/Different from baseline    Area of Impairment Attention;Memory;Following commands;Safety/judgement;Awareness    Current Attention Level Sustained;Focused   depending on arousal   Attention Comments sleepy during OT eval    Awareness Intellectual    Awareness Comments Had difficulty stating any deficit areas besides "I am weak"      Observation/Other Assessments   Focus on Therapeutic Outcomes (FOTO)  NA      Posture/Postural Control   Posture/Postural Control Postural limitations    Postural Limitations Rounded Shoulders;Forward head;Posterior pelvic tilt      Sensation   Light Touch Appears Intact      Coordination   Gross Motor Movements are Fluid and Coordinated Yes    Fine Motor Movements are Fluid and Coordinated Yes    9 Hole Peg Test Right;Left    Right 9 Hole Peg Test 30.12    Left 9 Hole Peg Test 34.35      Perception   Perception Within Functional Limits      Praxis   Praxis Intact      Edema   Edema Significant edema in BLE      ROM / Strength   AROM / PROM / Strength AROM;Strength      AROM   Overall AROM  Within functional limits for tasks performed    Overall AROM Comments BUE      Strength   Overall Strength Within functional limits for tasks performed    Overall Strength Comments 4-/5 PROXIAMLLY, 4/5 DISTALLY BUE      Hand Function   Right Hand Gross  Grasp Impaired    Right Hand Grip (lbs) 50.2    Right Hand Lateral Pinch 12 lbs    Left Hand Gross Grasp Impaired  Left Hand Grip (lbs) 51.3    Left Hand Lateral Pinch 11 lbs                             OT Education - 07/18/21 1812     Education Details Discussed with patient and wife that therapy could flex as needed around medical treatment needs.    Person(s) Educated Patient;Spouse    Methods Explanation    Comprehension Verbalized understanding              OT Short Term Goals - 07/18/21 1823       OT SHORT TERM GOAL #1   Title Patient will safely shower self with supervision and verbal cueing    Time 4    Period Weeks    Status New      OT SHORT TERM GOAL #2   Title Patient will complete a cold snack prep with supervision and cueing    Time 4    Period Weeks    Status New               OT Long Term Goals - 07/18/21 1824       OT LONG TERM GOAL #1   Title Patient will complete an HEP designed to improve grip strength in BUE    Time 6    Period Weeks    Status New      OT LONG TERM GOAL #2   Title Patient will identify need to toilet, and need no more than min assistance    Time 6    Period Weeks    Status New      OT LONG TERM GOAL #3   Title Patient will follow schedule of functional activity followed by rest time to maximize activity tolerance and attention    Time 6    Period Weeks    Status New      OT LONG TERM GOAL #4   Title Patient will dress his lower body with no more than min assist    Time 6    Period Weeks    Status New                   Plan - 07/18/21 1813     Clinical Impression Statement Pt is a 65 y.o. male who presented acutely 6/29 for progressive neurologic decline and was found to have multiple progressive enhancing lesions in his brain. CT of head revealed progression of R caudate and basal ganglia tumors. Metastatic work-up was negative. He underwent elective biopsy of the caudate  head lesion 6/30. 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  Patient presents to OT with decreased arousal and activity tolerance, edema in BLE, decreased sustained attention, and awareness, and decreased balance.  Patient has very supportive wife, and is following up with oncology next week for next steps.  Patient may benefit from skilled OT as tolerated to address safety and participation with ADL/ IADL.    OT Occupational Profile and History Detailed Assessment- Review of Records and additional review of physical, cognitive, psychosocial history related to current functional performance    Occupational performance deficits (Please refer to evaluation for details): ADL's;IADL's;Leisure    Body Structure / Function / Physical Skills ADL;Continence;Strength;IADL;FMC;Coordination;Balance;Decreased knowledge of precautions;Endurance;Sensation;Mobility;Decreased  knowledge of use of DME    Cognitive Skills Attention;Energy/Drive;Memory;Safety Awareness;Problem Solve;Understand    Rehab Potential Fair    Clinical Decision Making Several treatment options, min-mod task modification necessary    Comorbidities Affecting Occupational Performance: Presence of comorbidities impacting occupational performance    Comorbidities impacting occupational performance description: brain lesion, prior brain surgeries with shunt, diplopia, asthma, edema LE'S    Modification or Assistance to Complete Evaluation  Min-Moderate modification of tasks or assist with assess necessary to complete eval    OT Frequency 2x / week    OT Duration 6 weeks    OT Treatment/Interventions Self-care/ADL training;Therapeutic exercise;DME and/or AE instruction;Functional Mobility  Training;Cognitive remediation/compensation;Balance training;Neuromuscular education;Therapeutic activities;Patient/family education;Coping strategies training    Plan Safety/ participation with dressing and shower, HEP for activity tolerance    Consulted and Agree with Plan of Care Patient;Family member/caregiver    Family Member Consulted wife Lezlie             Patient will benefit from skilled therapeutic intervention in order to improve the following deficits and impairments:   Body Structure / Function / Physical Skills: ADL, Continence, Strength, IADL, FMC, Coordination, Balance, Decreased knowledge of precautions, Endurance, Sensation, Mobility, Decreased knowledge of use of DME Cognitive Skills: Attention, Energy/Drive, Memory, Safety Awareness, Problem Solve, Understand     Visit Diagnosis: Localized edema - Plan: Ot plan of care cert/re-cert  Muscle weakness (generalized) - Plan: Ot plan of care cert/re-cert  Attention and concentration deficit - Plan: Ot plan of care cert/re-cert    Problem List Patient Active Problem List   Diagnosis Date Noted   Acute CVA (cerebrovascular accident) ([place]) 07/07/2021   CVA (cerebral vascular accident) ([place]) 07/06/2021   Brain lesion 06/28/2021   Right frontal lobe lesion 06/28/2021   Brain mass 05/31/2021   History of CVA (cerebrovascular accident) 03/08/2020   Fatigue 02/10/2020   Memory loss 02/10/2020   Depression 02/10/2020   Tinea cruris 01/28/2020   Left shoulder tendinitis 10/13/2018   Gait abnormality 09/21/2018   Poor short-term memory 09/21/2018   Drooping eyelid disease, left 09/21/2018   History of prostate cancer 09/23/2016   Essential hypertension 09/23/2016   Diabetes mellitus without complication ([place]) 50/02/7047   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/2011   GERD (gastroesophageal reflux disease) 06/04/2011   Dyslipidemia 06/04/2011   Headache disorder 06/04/2011   Hypothyroidism  03/18/2008   Asthma 03/18/2008    Mariah Milling, OTR/L 07/18/2021, 6:29 PM  [facility] [address] [place], [place], [address] Phone: [phone]   Fax:  [phone]  Name: IRVINE GLORIOSO MRN: [id number] Date of Birth: 1956/02/13

## 2021-07-23 ENCOUNTER — Inpatient Hospital Stay: Payer: 59 | Attending: Internal Medicine | Admitting: Internal Medicine

## 2021-07-23 ENCOUNTER — Other Ambulatory Visit: Payer: Self-pay

## 2021-07-23 VITALS — HR 64 | Temp 97.3°F | Resp 18 | Wt 197.1 lb

## 2021-07-23 DIAGNOSIS — Z923 Personal history of irradiation: Secondary | ICD-10-CM | POA: Diagnosis not present

## 2021-07-23 DIAGNOSIS — C711 Malignant neoplasm of frontal lobe: Secondary | ICD-10-CM | POA: Diagnosis present

## 2021-07-23 DIAGNOSIS — Z9079 Acquired absence of other genital organ(s): Secondary | ICD-10-CM | POA: Diagnosis not present

## 2021-07-23 DIAGNOSIS — C719 Malignant neoplasm of brain, unspecified: Secondary | ICD-10-CM

## 2021-07-23 DIAGNOSIS — Z8546 Personal history of malignant neoplasm of prostate: Secondary | ICD-10-CM | POA: Insufficient documentation

## 2021-07-23 DIAGNOSIS — Z8673 Personal history of transient ischemic attack (TIA), and cerebral infarction without residual deficits: Secondary | ICD-10-CM | POA: Diagnosis not present

## 2021-07-23 DIAGNOSIS — Z7984 Long term (current) use of oral hypoglycemic drugs: Secondary | ICD-10-CM | POA: Diagnosis not present

## 2021-07-23 DIAGNOSIS — E039 Hypothyroidism, unspecified: Secondary | ICD-10-CM | POA: Insufficient documentation

## 2021-07-23 DIAGNOSIS — Z7982 Long term (current) use of aspirin: Secondary | ICD-10-CM | POA: Diagnosis not present

## 2021-07-23 DIAGNOSIS — E785 Hyperlipidemia, unspecified: Secondary | ICD-10-CM | POA: Diagnosis not present

## 2021-07-23 DIAGNOSIS — Z79899 Other long term (current) drug therapy: Secondary | ICD-10-CM | POA: Insufficient documentation

## 2021-07-23 DIAGNOSIS — Z7189 Other specified counseling: Secondary | ICD-10-CM | POA: Diagnosis not present

## 2021-07-23 DIAGNOSIS — I10 Essential (primary) hypertension: Secondary | ICD-10-CM | POA: Diagnosis not present

## 2021-07-23 DIAGNOSIS — Z7951 Long term (current) use of inhaled steroids: Secondary | ICD-10-CM | POA: Insufficient documentation

## 2021-07-23 MED ORDER — FUROSEMIDE 20 MG PO TABS: 20.0000 mg | ORAL_TABLET | Freq: Every day | ORAL | 1 refills | Status: AC

## 2021-07-23 NOTE — Progress Notes (Signed)
[facility] [place], [place] [address] [phone]   Interval Evaluation  Date of Service: 07/23/21 Patient Name: Adam Sullivan Patient MRN: [id number] Patient DOB: 03-04-1956 Provider: Ventura Sellers, MD  Identifying Statement:  Adam Sullivan is a 65 y.o. male with right frontal and brainstem glioblastoma who presents for initial consultation and evaluation.    CNS Oncologic History 1993/94: Pineal mass resection, VP shunt placed.  Post-operative radiation administered. 05/24/21: Brain MRI demonstrates multi-focal enhancing masses; R caudate, capsule, midbrain 06/28/21: Stereotactic biopsy by Dr. Marcello Moores 07/06/21: Left PCA infarct  Biomarkers:  MGMT Unknown.  IDH 1/2 Wild type.  EGFR Unknown  TERT Unknown   Interval History: Adam Sullivan presents today for follow up after biopsy, finalization of pathology from [facility].  Surgery had been delayed by hematologic workup, given patient's history of both hemorrhagic and thromboembolic complications post-operatively in the past.  Unfortunately that pattern persisted, as he experienced a stroke ~1 week after surgery.  His wife describes sudden "word salad", inability to speak coherently.  Since hospitalization he has been on aspirin and plavix, with only modest improvement in language symptoms.  He is now sleeping much of the day, and is reliant on her support for dressing, cooking/feeding, some toileting.  Overall quality of life has been poor and lacking independence.   H+P (05/31/21) Patient presented to medical attention this past month when family noticed change in behavior and mentation.  They describe impairment in short term memory, odd/unusual answers to questions at times.  They also describe inattention to physical appearance, cleanliness.  This is a clear change from prior, last time at baseline was 2-3 months ago.  Patient acknowledges some changes but doesn't have any  specific complaints.  PCP ordered brain MRI study which demonstrated abnormal findings.  His history includes pineal mass (unknown histology), with craniotomy, VP shunt placement and post-operative radiation therapy given in the early 1990's.  At baseline he has some double vision, slurred speech from those interventions.  Is retired from [employer] system in [place].  Medications: Current Outpatient Medications on File Prior to Visit  Medication Sig Dispense Refill   ADVAIR DISKUS 250-50 MCG/DOSE AEPB USE 1 PUFF EVERY 12 HOURS. 60 each 0   albuterol (PROAIR HFA) 108 (90 Base) MCG/ACT inhaler Inhale 2 puffs into the lungs every 6 (six) hours as needed for shortness of breath or wheezing.     aspirin EC 81 MG EC tablet Take 1 tablet (81 mg total) by mouth daily. Swallow whole. 90 tablet 1   cetirizine (ZYRTEC) 10 MG tablet Take 10 mg by mouth in the morning.     clopidogrel (PLAVIX) 75 MG tablet Take 1 tablet (75 mg total) by mouth daily. 90 tablet 0   ezetimibe-simvastatin (VYTORIN) 10-40 MG tablet Take 1 tablet by mouth at bedtime. 90 tablet 1   hydrochlorothiazide (HYDRODIURIL) 25 MG tablet Take 1 tablet (25 mg total) by mouth daily. 90 tablet 1   metFORMIN (GLUCOPHAGE) 500 MG tablet Take 1 tablet (500 mg total) by mouth daily with breakfast for 15 days, THEN 1 tablet (500 mg total) 2 (two) times daily with a meal. 165 tablet 0   montelukast (SINGULAIR) 10 MG tablet Take 1 tablet (10 mg total) by mouth at bedtime. 90 tablet 1   Multiple Vitamins-Minerals (CENTRUM SILVER 50+MEN PO) Take 1 tablet by mouth every evening.     pantoprazole (PROTONIX) 40 MG tablet Take 1 tablet (40  mg total) by mouth daily. 90 tablet 1   SYNTHROID 75 MCG tablet Take 1 tablet (75 mcg total) by mouth daily. 90 tablet 1   vitamin B-12 (CYANOCOBALAMIN) 500 MCG tablet Take 500 mcg by mouth every evening.     No current facility-administered medications on file prior to visit.    Allergies:  Allergies  Allergen  Reactions   Chocolate Other (See Comments)    Unsure of reaction   Dust Mite Extract Other (See Comments)    "Asthma"   Erythromycin Other (See Comments)    Reaction not recalled- occurred in childhood   Kola Nut Other (See Comments)    Cannot drink Pepsi or Coca-Cola, unsure of reaction   Other Other (See Comments)    Multigrain bread and tested allergic to many things- caused blistering on his back when allergy tested   Past Medical History:  Past Medical History:  Diagnosis Date   Allergy    Anxiety    Arthritis    Asthma    severe, Dr. Hopper   Blood clots in brain    history of    Brain tumor (HCC) 12/31/1991   Depression    situational   Dyspnea    GERD (gastroesophageal reflux disease)    severe   Headache(784.0)    Hyperlipidemia    Hypothyroidism    Orchalgia    Pneumonia    hx of   Pre-diabetes    Prostate cancer (HCC) 07/01/2013   gleason 6   Thyroid disease    hypothyroidism   Past Surgical History:  Past Surgical History:  Procedure Laterality Date   APPLICATION OF CRANIAL NAVIGATION Right 06/28/2021   Procedure: APPLICATION OF CRANIAL NAVIGATION;  Surgeon: Thomas, Jonathan G, MD;  Location: [facility] OR;  Service: Neurosurgery;  Laterality: Right;   brain shunt  12/31/1991   put in, removed, put in again   BRAIN SURGERY     DIAGNOSTIC LAPAROSCOPY     FRAMELESS  BIOPSY WITH BRAINLAB Right 06/28/2021   Procedure: STEREOTACTIC FRONTAL BIOPSY OF CAUDATE LESION;  Surgeon: Thomas, Jonathan G, MD;  Location: [facility] OR;  Service: Neurosurgery;  Laterality: Right;   LYMPHADENECTOMY Bilateral 10/22/2013   Procedure: LYMPHADENECTOMY;  Surgeon: Theodore Manny, MD;  Location: [facility]S;  Service: Urology;  Laterality: Bilateral;   removal brain tumor  12/31/1991   pineal tumor, benign   removed blood clot     X 2   ROBOT ASSISTED LAPAROSCOPIC RADICAL PROSTATECTOMY N/A 10/22/2013   Procedure: ROBOTIC ASSISTED LAPAROSCOPIC RADICAL PROSTATECTOMY   PRE- PERITONEAL APPROACH;   Surgeon: Theodore Manny, MD;  Location: [facility];  Service: Urology;  Laterality: N/A;  3.5 HRS    Social History:  Social History   Socioeconomic History   Marital status: Married    Spouse name: Not on file   Number of children: Not on file   Years of education: Not on file   Highest education level: Not on file  Occupational History   Not on file  Tobacco Use   Smoking status: Never   Smokeless tobacco: Never  Vaping Use   Vaping Use: Never used  Substance and Sexual Activity   Alcohol use: Yes    Comment: Rarely   Drug use: No   Sexual activity: Not Currently  Other Topics Concern   Not on file  Social History Narrative   Exercise: none   Social Determinants of Health   Financial Resource Strain: Not on file  Food Insecurity: Not on file  Transportation   Needs: Not on file  Physical Activity: Not on file  Stress: Not on file  Social Connections: Not on file  Intimate Partner Violence: Not on file   Family History:  Family History  Problem Relation Age of Onset   Cancer Brother        anal cancer   Asthma Maternal Uncle    Cancer Father        prostate  cancer   Testicular cancer Father    Coronary artery disease Mother 65       5 vessel CABG   Colon cancer Neg Hx    Stomach cancer Neg Hx     Review of Systems: Constitutional: Doesn't report fevers, chills or abnormal weight loss Eyes: Doesn't report blurriness of vision Ears, nose, mouth, throat, and face: Doesn't report sore throat Respiratory: Doesn't report cough, dyspnea or wheezes Cardiovascular: Doesn't report palpitation, chest discomfort  Gastrointestinal:  Doesn't report nausea, constipation, diarrhea GU: Doesn't report incontinence Skin: Doesn't report skin rashes Neurological: Per HPI Musculoskeletal: Doesn't report joint pain Behavioral/Psych: Doesn't report anxiety  Physical Exam: Vitals:   07/23/21 1441  Pulse: 64  Resp: 18  Temp: (!) 97.3 F (36.3 C)  SpO2: 100%   KPS:  60. General: Alert, cooperative, pleasant, in no acute distress Head: Normal EENT: No conjunctival injection or scleral icterus.  Lungs: Resp effort normal Cardiac: Regular rate Abdomen: Non-distended abdomen Skin: No rashes cyanosis or petechiae. Extremities: +++ pitting edema b/l Le  Neurologic Exam: Mental Status: Asleep, arouses with light stimulation but does not remain alert, is not attentive to examiner. Roughly oriented to self and environment. Language is impaired with regards to fluency, comprehension, repetition.  Elements of agnosia, poor insight. Cranial Nerves: Visual acuity is grossly normal. Visual fields are full. Extra-ocular movements intact with vertical diplopia. L eyelid ptotic. Face is symmetric Motor: Tone and bulk are normal. Power is antigravity in both arms and legs. Reflexes are symmetric, no pathologic reflexes present.  Sensory: Intact to light touch Gait: Deferred   Labs: I have reviewed the data as listed    Component Value Date/Time   NA 131 (L) 07/08/2021 0314   K 4.2 07/08/2021 0314   CL 97 (L) 07/08/2021 0314   CO2 28 07/08/2021 0314   GLUCOSE 149 (H) 07/08/2021 0314   BUN 27 (H) 07/08/2021 0314   CREATININE 0.88 07/08/2021 0314   CALCIUM 8.2 (L) 07/08/2021 0314   PROT 5.8 (L) 07/06/2021 1221   ALBUMIN 2.6 (L) 07/08/2021 0314   AST 28 07/06/2021 1221   ALT 84 (H) 07/06/2021 1221   ALKPHOS 46 07/06/2021 1221   BILITOT 0.7 07/06/2021 1221   GFRNONAA >60 07/08/2021 0314   GFRAA >90 10/23/2013 0520   Lab Results  Component Value Date   WBC 12.8 (H) 07/08/2021   NEUTROABS 14.8 (H) 07/06/2021   HGB 12.2 (L) 07/08/2021   HCT 35.1 (L) 07/08/2021   MCV 84.6 07/08/2021   PLT 254 07/08/2021    Imaging:  CT ANGIO HEAD NECK W WO CM  Result Date: 07/07/2021 CLINICAL DATA:  Stroke follow-up. EXAM: CT ANGIOGRAPHY HEAD AND NECK TECHNIQUE: Multidetector CT imaging of the head and neck was performed using the standard protocol during bolus  administration of intravenous contrast. Multiplanar CT image reconstructions and MIPs were obtained to evaluate the vascular anatomy. Carotid stenosis measurements (when applicable) are obtained utilizing NASCET criteria, using the distal internal carotid diameter as the denominator. CONTRAST:  100mL OMNIPAQUE IOHEXOL 350 MG/ML SOLN COMPARISON:  None. FINDINGS:   CT HEAD FINDINGS Brain: Mildly increased edema associated with the acute left anterior temporal lobe infarct. Local sulcal effacement without midline shift. No substantial change in right caudate and right basal ganglia lesions and small focus of right pontine encephalomalacia, better characterized on yesterday's MRI. Similar position of a right parietal approach ventriculostomy catheter which terminates in the atrium right lateral ventricle. Similar position of an additional midline occipital approach catheter which terminates between the frontal horns of the lateral ventricles. No evidence of hydrocephalus. No definite acute hemorrhage. No evidence of interval large vascular territory infarct. Vascular: See below. Skull: Mild scattered paranasal sinus mucosal thickening. Sinuses: Mild mucosal thickening.  No acute orbital findings. Orbits: No mastoid effusions. Review of the MIP images confirms the above findings CTA NECK FINDINGS Motion limited evaluation. Aortic arch: Aortic atherosclerosis. Great vessel origins are patent. Right carotid system: Motion limited evaluation without evidence of significant (greater than 50%) stenosis. Mild calcific and noncalcific atherosclerosis at the carotid bifurcation. Left carotid system: Motion limited evaluation without evidence of greater than 50% stenosis. Mild to moderate calcific and noncalcific atherosclerosis at the carotid bifurcation Vertebral arteries: Mildly right dominant. Essentially nondiagnostic evaluation of the origins/proximal vertebral arteries due to motion and beam hardening artifact with suspected  severe stenosis of the left vertebral artery origin. More distal vertebral arteries are patent in the neck. Skeleton: Multilevel severe facet arthropathy. Other neck: No acute abnormality. Upper chest: Visualized lung apices are clear. Review of the MIP images confirms the above findings CTA HEAD FINDINGS Motion limited evaluation. Anterior circulation: Calcific atherosclerosis of bilateral cavernous and paraclinoid ICAs with suspected moderate bilateral paraclinoid ICA stenosis. Severe stenosis versus occlusion of the right ICA terminus. Irregular opacification of a severely narrowed right M1 MCA with opacification of proximal M2 MCA branches. Small, irregular right A1 ACA. The left A1 ACA and visualized proximal A2 ACA branches are patent. Posterior circulation: Motion limited evaluation with very small vertebrobasilar system with bilateral posterior communicating arteries largely supplying the PCAs. Poor visualization of a small right intradural vertebral artery and only minimal opacification of a small basilar artery. Focal loss of opacification of the distal left vertebral artery may represent a superimposed stenosis. The small left vertebral artery appears to largely terminate as PICA. Suspected multifocal moderate to severe stenosis of the small left PCA and severe stenosis of the distal right P2 PCA. Venous sinuses: As permitted by contrast timing, patent. Review of the MIP images confirms the above findings IMPRESSION: CT head: 1. Mildly increased edema associated with the acute left anterior temporal lobe infarct. Local sulcal effacement without midline shift. 2. No substantial change in right caudate and right basal ganglia lesions and small focus of right pontine encephalomalacia, better characterized on yesterday's MRI. 3. Similar additional chronic findings, as described above. CTA head: 1. Severe stenosis versus short-segment occlusion of the distal left M1 MCA. 2. Severe stenosis versus occlusion of  the right ICA terminus. Irregular opacification of a severely narrowed right M1 MCA with opacification of proximal M2 MCA branches. Small, irregular right A1 ACA as well. 3. Motion limited evaluation with very small vertebrobasilar system with bilateral posterior communicating arteries largely supplying the PCAs. Poor visualization of a small right intradural vertebral artery and only minimal opacification of a very small basilar artery. Focal loss of opacification of the distal left vertebral artery may represent a superimposed severe stenosis or occlusion. The small left vertebral artery appears to largely terminate as PICA. 4. Suspected multifocal moderate to severe stenosis of the small   left PCA and severe stenosis of the distal right P2 PCA. 5. Moderate bilateral paraclinoid ICA stenosis. CTA neck: 1. Essentially nondiagnostic evaluation of the proximal vertebral arteries due to motion and beam hardening artifact with suspected severe stenosis of the left vertebral artery origin. More distal vertebral arteries are patent in the neck. 2. No significant (greater than 50%) stenosis of the carotid arteries in the neck. Findings discussed with Dr. Xu at 11:29 a.m. via telephone. Electronically Signed   By: Frederick S Jones MD   On: 07/07/2021 12:04   DG Chest 2 View  Result Date: 07/06/2021 CLINICAL DATA:  Altered mental status, confusion for 24 hours EXAM: CHEST - 2 VIEW COMPARISON:  10/20/2013 FINDINGS: Enlargement of cardiac silhouette. Mediastinal contours and pulmonary vascularity normal. Lungs clear. No infiltrate, pleural effusion, or pneumothorax. Bones demineralized. IMPRESSION: No acute abnormalities. Enlargement of cardiac silhouette. Electronically Signed   By: Mark  Boles M.D.   On: 07/06/2021 13:06   CT HEAD WO CONTRAST  Result Date: 07/06/2021 CLINICAL DATA:  Altered mental status. EXAM: CT HEAD WITHOUT CONTRAST TECHNIQUE: Contiguous axial images were obtained from the base of the skull through  the vertex without intravenous contrast. COMPARISON:  06/29/2021. FINDINGS: Brain: New hypodensity and loss of gray-white differentiation in the anterior left temporal lobe, most likely an acute or subacute infarct. Interval resolution of the small volume of pneumocephalus seen on the prior. Redemonstrated hyperdense right caudate mass which appears similar in size in comparison to recent CT head from 06/29/2021. Linear hypodensity adjacent to the mass likely is a sequela recent biopsy. Mild regional mass effect is also similar. No convincing acute/interval hemorrhage. Heterogeneous hyperdensity in the basal ganglia likely reflects tumor when comparing to prior MRI. Similar small hypodensity in the right pons. Similar position of a right parietal approach ventriculostomy catheter which terminates in the atrium right lateral ventricle. Low position of additional midline occipital approach catheter which terminates between the frontal horns of the lateral ventricles. No evidence of hydrocephalus. Old ventriculostomy catheter tract is noted in the right frontal lobe. Vascular: Calcific atherosclerosis. No hyperdense vessel identified. Skull: Right frontal craniotomy. Right-sided burr hole. Suboccipital craniectomy. Sinuses/Orbits: Mild paranasal sinus mucosal thickening. No acute orbital findings. Other: No mastoid effusions. IMPRESSION: 1. New hypodensity and loss of gray-white differentiation in the anterior left temporal lobe, most likely an acute or subacute infarct. Recommend MRI with contrast to confirm and to exclude other etiologies. 2. In comparison to recent CT head from June 29, 2021, no substantial change in the masslike densities in the right caudate and basal ganglia, further evaluated on prior MRI. 3. 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 Electronically Signed   By: Frederick S Jones MD   On: 07/06/2021 14:26   CT HEAD WO CONTRAST  Result Date: 06/29/2021 CLINICAL DATA:   64-year-old male with history of ventriculoperitoneal shunt. Multifocal enhancing brain lesions on MRI in May, most notably the right caudate nucleus. Postoperative day 1 status post stereotactic caudate lesion biopsy. EXAM: CT HEAD WITHOUT CONTRAST TECHNIQUE: Contiguous axial images were obtained from the base of the skull through the vertex without intravenous contrast. COMPARISON:  Brain MRI 05/24/2021 and earlier. FINDINGS: Brain: Small volume pneumocephalus, including along a biopsy tract from the right superior frontal bone to the region of the hyperdense right caudate mass (series 2, image 22). The mass appears larger by CT since 05/23/2021, about 30 mm now versus 24 mm at that time. Mild regional mass effect is not significantly changed. No convincing acute intracranial hemorrhage   identified. Heterogeneous density in the right basal ganglia appears to be tumor related as on the MRI (series 2, image 19) and is more conspicuous since the CT that month. No midline shift. Stable basilar cisterns. No ventriculomegaly. Chronic right posterior and midline suboccipital shunt catheters. Stable gray-white matter differentiation in the left hemisphere and posterior fossa, including stable CT appearance of the right pons. No cortically based acute infarct identified. Vascular: Extensive calcified atherosclerosis at the skull base. Skull: Chronic suboccipital craniectomy. Remote right frontotemporal craniotomy. Chronic right posterior convexity burr hole. Small new right frontal bone postoperative burr hole. No other acute intracranial abnormality. Sinuses/Orbits: Visualized paranasal sinuses and mastoids are stable and well aerated. Other: Subtle postoperative changes at the right scalp vertex. Chronic right lateral convexity shunt reservoir and tubing appears stable. Negative orbits. IMPRESSION: 1. No adverse features status post frontal approach stereotactic biopsy of the right caudate. 2. Heterogeneous mass-like  density at the right caudate and basal ganglia does appear increased by CT since 05/23/2021 suggesting progression of tumor. 3. Stable chronic ventriculostomy shunts. No new intracranial abnormality. Electronically Signed   By: Genevie Ann M.D.   On: 06/29/2021 06:55   MR Brain W and Wo Contrast  Result Date: 07/06/2021 CLINICAL DATA:  Neuro deficit, acute stroke suspected. EXAM: MRI HEAD WITHOUT AND WITH CONTRAST TECHNIQUE: Multiplanar, multiecho pulse sequences of the brain and surrounding structures were obtained without and with intravenous contrast. CONTRAST:  40m GADAVIST GADOBUTROL 1 MMOL/ML IV SOLN COMPARISON:  MRI May 24, 2021. FINDINGS: Brain: The hypoattenuation in the left temporal lobe seen on same day CT head correlates with nonenhancing restricted diffusion, most consistent with acute infarct.Associated edema and local sulcal effacement. Increased size of expansile T2/FLAIR hyperintensity within the right caudate with increasingly heterogeneous enhancement, now measuring 2.9 x 2.0 cm on series 9, image 14, previously 2.5 x 1.5 cm when remeasured. Enhancement abuts and is inseparable from the frontal horn of the right lateral ventricle, separate. Heterogeneous enhancement also extends into adjacent frontal white matter superiorly. Additionally, mildly increased size of the expansile, peripherally enhancing lesion in the posterior limb of the right internal capsule, now measuring approximately 1.5 cm on series 9, image 12, previously 1.2 cm when remeasured. Both these areas demonstrate restricted diffusion, suggestive of hypercellularity. The previously seen enhancement in the right pons has resolved with small focus of T2 hyperintensity in this region. Similar chronic hemorrhagic lacunar infarcts in bilateral basal ganglia and chronic microhemorrhages in the thalami and elsewhere in the cerebral and cerebellum, as well as the brainstem. Right parietal approach ventriculostomy catheter is again noted a  terminating in the atrium of the right lateral ventricle. Additionally, there is a midline ventriculostomy catheter which terminates in similar position. No hydrocephalus. Remote ventriculostomy catheter tract in the right frontal lobe, similar. No extra-axial fluid collection.  No midline shift. Vascular: Major arterial flow voids are maintained at the skull base. Small vertebrobasilar system, similar to prior. Skull and upper cervical spine: Normal marrow signal. Sinuses/Orbits: Mild sinus mucosal thickening.  Unremarkable orbits. IMPRESSION: 1. The hypoattenuation in the left temporal lobe seen on same day CT head correlates with nonenhancing restricted diffusion, compatible with acute infarct. Associated edema and local sulcal effacement. Additional small nonenhancing focus of restricted diffusion in the right paramidline pons likely represents an additional acute infarct 2. Increased size/bulk of expansile, heterogeneously enhancing masses in the right caudate and smaller lesion in the adjacent posterior limb of the internal capsule, as described above. 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 Atypical infection is considered less likely. Recommend correlation with pathology results of the recent biopsy. 3. The previously seen area of enhancement in the right pons has resolved with focal T2 hyperintensity in this region. Given the apparent resolution of enhancement, this area may have previously represented an enhancing subacute infarct, but this warrants continued attention on follow-up. Findings discussed with Dr. Vanita Panda at 7:18 p.m. via telephone. Electronically Signed   By: Margaretha Sheffield MD   On: 07/06/2021 19:38   EEG adult  Result Date: 07/07/2021 Lora Havens, MD     07/07/2021  8:47 AM Patient Name: Adam Sullivan MRN: [id number] Epilepsy Attending: Lora Havens Referring  Physician/Provider: Dr Donnetta Simpers Date: 79/2022 Duration: 20.29 mins Patient history:  18M with brain biopsy of lesion concerning for malignancy a week ago who presents with intermittent mild aphasia/confusion. EEG to evaluate for seizure. Level of alertness: Awake AEDs during EEG study: LEV Technical aspects: This EEG study was done with scalp electrodes positioned according to the 10-20 International system of electrode placement. Electrical activity was acquired at a sampling rate of 500Hz and reviewed with a high frequency filter of 70Hz and a low frequency filter of 1Hz. EEG data were recorded continuously and digitally stored. Description: The posterior dominant rhythm consists of 8Hz activity of moderate voltage (25-35 uV) seen predominantly in posterior head regions, symmetric and reactive to eye opening and eye closing. EEG showed continuous generalized left > right frontal 3 to 6 Hz theta-delta slowing.  Hyperventilation and photic stimulation were not performed.   ABNORMALITY - Continuous slow,left>right frontal region IMPRESSION: This study is suggestive of cortical dysfunction arising from left> right frontal region likely secondary to underlying structural abnormality. No seizures or definite epileptiform discharges were seen throughout the recording. If suspicion for ictal-interictal activity remains a concern, a prolonged study can be considered. Lora Havens   ECHOCARDIOGRAM COMPLETE BUBBLE STUDY  Result Date: 07/07/2021    ECHOCARDIOGRAM REPORT   Patient Name:   Adam Sullivan Date of Exam: 07/07/2021 Medical Rec #:  [id number]    Height:       66.0 in Accession #:    [id number]   Weight:       185.2 lb Date of Birth:  [birth year]     BSA:          1.935 m Patient Age:    33 years     BP:           153/71 mmHg Patient Gender: M            HR:           50 bpm. Exam Location:  Inpatient Procedure: 2D Echo, Cardiac Doppler, Color Doppler, Saline Contrast Bubble Study            and  Intracardiac Opacification Agent Indications:    Stroke 434.91 / I63.9  History:        Patient has no prior history of Echocardiogram examinations.                 Signs/Symptoms:Dyspnea; Risk Factors:Hypertension. GERD.  Sonographer:    Vickie Epley RDCS Referring Phys: [id number] [facility]  Sonographer Comments: Suboptimal parasternal window. IMPRESSIONS  1. Left ventricular ejection fraction, by estimation, is 55 to 60%. The left ventricle has normal function. The left ventricle has no regional wall motion abnormalities. Left ventricular diastolic parameters were normal.  2.  Right ventricular systolic function is normal. The right ventricular size is normal. Tricuspid regurgitation signal is inadequate for assessing PA pressure.  3. Left atrial size was moderately dilated.  4. The mitral valve is normal in structure. Trivial mitral valve regurgitation. No evidence of mitral stenosis.  5. The aortic valve was not well visualized. Aortic valve regurgitation is mild. No aortic stenosis is present.  6. The inferior vena cava is normal in size with greater than 50% respiratory variability, suggesting right atrial pressure of 3 mmHg.  7. Agitated saline contrast bubble study was negative, with no evidence of any interatrial shunt. FINDINGS  Left Ventricle: No left ventricular thrombus witth Definity. Left ventricular ejection fraction, by estimation, is 55 to 60%. The left ventricle has normal function. The left ventricle has no regional wall motion abnormalities. Definity contrast agent was given IV to delineate the left ventricular endocardial borders. The left ventricular internal cavity size was normal in size. There is no left ventricular hypertrophy. Left ventricular diastolic parameters were normal. Indeterminate filling pressures. Right Ventricle: The right ventricular size is normal. No increase in right ventricular wall thickness. Right ventricular systolic function is normal. Tricuspid regurgitation signal  is inadequate for assessing PA pressure. Left Atrium: Left atrial size was moderately dilated. Right Atrium: Right atrial size was normal in size. Pericardium: There is no evidence of pericardial effusion. Mitral Valve: The mitral valve is normal in structure. Trivial mitral valve regurgitation. No evidence of mitral valve stenosis. Tricuspid Valve: The tricuspid valve is normal in structure. Tricuspid valve regurgitation is not demonstrated. Aortic Valve: The aortic valve was not well visualized. Aortic valve regurgitation is mild. No aortic stenosis is present. Pulmonic Valve: The pulmonic valve was grossly normal. Pulmonic valve regurgitation is not visualized. Aorta: The aortic root was not well visualized. Venous: The inferior vena cava is normal in size with greater than 50% respiratory variability, suggesting right atrial pressure of 3 mmHg. IAS/Shunts: No atrial level shunt detected by color flow Doppler. Agitated saline contrast was given intravenously to evaluate for intracardiac shunting. Agitated saline contrast bubble study was negative, with no evidence of any interatrial shunt.  LEFT VENTRICLE PLAX 2D LVIDd:         5.50 cm      Diastology LVIDs:         4.50 cm      LV e' medial:    6.31 cm/s LV PW:         0.90 cm      LV E/e' medial:  16.3 LV IVS:        0.90 cm      LV e' lateral:   9.03 cm/s LVOT diam:     2.40 cm      LV E/e' lateral: 11.4 LV SV:         126 LV SV Index:   65 LVOT Area:     4.52 cm  LV Volumes (MOD) LV vol d, MOD A2C: 152.0 ml LV vol d, MOD A4C: 118.0 ml LV vol s, MOD A2C: 52.1 ml LV vol s, MOD A4C: 60.0 ml LV SV MOD A2C:     99.9 ml LV SV MOD A4C:     118.0 ml LV SV MOD BP:      84.3 ml RIGHT VENTRICLE RV S prime:     14.30 cm/s TAPSE (M-mode): 2.4 cm LEFT ATRIUM             Index       RIGHT ATRIUM             Index LA diam:        4.60 cm 2.38 cm/m  RA Area:     11.70 cm LA Vol (A2C):   59.8 ml 30.90 ml/m RA Volume:   23.80 ml  12.30 ml/m LA Vol (A4C):   77.2 ml 39.89  ml/m LA Biplane Vol: 68.8 ml 35.55 ml/m  AORTIC VALVE LVOT Vmax:   98.20 cm/s LVOT Vmean:  67.200 cm/s LVOT VTI:    0.278 m MITRAL VALVE MV Area (PHT): 3.20 cm     SHUNTS MV Decel Time: 237 msec     Systemic VTI:  0.28 m MV E velocity: 103.00 cm/s  Systemic Diam: 2.40 cm MV A velocity: 80.00 cm/s MV E/A ratio:  1.29 Mihai Croitoru MD Electronically signed by Mihai Croitoru MD Signature Date/Time: 07/07/2021/2:22:40 PM    Final    VAS US LOWER EXTREMITY VENOUS (DVT)  Result Date: 07/07/2021  Lower Venous DVT Study Patient Name:  Adam Sullivan  Date of Exam:   07/07/2021 Medical Rec #: [id number]     Accession #:    [id number] Date of Birth: 12/09/1956      Patient Gender: M Patient Age:   65Y Exam Location:  [facility] Procedure:      VAS US LOWER EXTREMITY VENOUS (DVT) Referring Phys: [id number] JINDONG XU --------------------------------------------------------------------------------  Indications: Stroke.  Comparison Study: no prior Performing Technologist: Megan Stricklin RVS  Examination Guidelines: A complete evaluation includes B-mode imaging, spectral Doppler, color Doppler, and power Doppler as needed of all accessible portions of each vessel. Bilateral testing is considered an integral part of a complete examination. Limited examinations for reoccurring indications may be performed as noted. The reflux portion of the exam is performed with the patient in reverse Trendelenburg.  +---------+---------------+---------+-----------+----------+--------------+ RIGHT    CompressibilityPhasicitySpontaneityPropertiesThrombus Aging +---------+---------------+---------+-----------+----------+--------------+ CFV      Full           Yes      Yes                                 +---------+---------------+---------+-----------+----------+--------------+ SFJ      Full                                                        +---------+---------------+---------+-----------+----------+--------------+  FV Prox  Full                                                        +---------+---------------+---------+-----------+----------+--------------+ FV Mid   Full                                                        +---------+---------------+---------+-----------+----------+--------------+ FV DistalFull                                                        +---------+---------------+---------+-----------+----------+--------------+   PFV      Full                                                        +---------+---------------+---------+-----------+----------+--------------+ POP      Full           Yes      Yes                                 +---------+---------------+---------+-----------+----------+--------------+ PTV      Full                                                        +---------+---------------+---------+-----------+----------+--------------+ PERO     Full                                                        +---------+---------------+---------+-----------+----------+--------------+   +---------+---------------+---------+-----------+----------+--------------+ LEFT     CompressibilityPhasicitySpontaneityPropertiesThrombus Aging +---------+---------------+---------+-----------+----------+--------------+ CFV      Full           Yes      Yes                                 +---------+---------------+---------+-----------+----------+--------------+ SFJ      Full                                                        +---------+---------------+---------+-----------+----------+--------------+ FV Prox  Full                                                        +---------+---------------+---------+-----------+----------+--------------+ FV Mid   Full                                                        +---------+---------------+---------+-----------+----------+--------------+ FV DistalFull                                                         +---------+---------------+---------+-----------+----------+--------------+ PFV      Full                                                        +---------+---------------+---------+-----------+----------+--------------+   POP      Full           Yes      Yes                                 +---------+---------------+---------+-----------+----------+--------------+ PTV      Full                                                        +---------+---------------+---------+-----------+----------+--------------+ PERO     Full                                                        +---------+---------------+---------+-----------+----------+--------------+     Summary: BILATERAL: - No evidence of deep vein thrombosis seen in the lower extremities, bilaterally. -No evidence of popliteal cyst, bilaterally.   *See table(s) above for measurements and observations. Electronically signed by Jamelle Haring on 07/07/2021 at 4:40:31 PM.    Final     Pathology:  SURGICAL PATHOLOGY  CASE: [phone]  PATIENT: Adam Sullivan  Surgical Pathology Report    Clinical History: Brain lesion (nt)    FINAL MICROSCOPIC DIAGNOSIS:   A. BRAIN, RIGHT CAUDATE LESION, BIOPSY:  - High-grade glioma most consistent with glioblastoma, IDH-wild-type,  WHO grade 4. See comment   B. BRAIN, RIGHT CAUDATE LESION, BIOPSY:  - High-grade glioma most consistent with glioblastoma, IDH-wild-type,  WHO grade 4. See comment    COMMENT:   A and B.  This case was sent out in consultation to Dr. Moody Bruins at  [facility], [place], [place].  A copy of the outside pathology  report is available in patient's electronic medical records.    INTRAOPERATIVE DIAGNOSIS:   A1. BRAIN, RIGHT CAUDATE LESION, FROZEN SECTION:         Lesional tissue present.         Rapid intraoperative consult diagnosis rendered by Dr. Melina Copa @  1528 06/28/2021.    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  His stroke impacted language, memory, cognition and independence in ways that, when combined with tumor burden, make him reliant on family member for all activities of daily living.    We had an extensive conversation with him and his wife regarding pathology, prognosis, and available treatment pathways for glioblastoma.  We established framework for goals of care, and discussed palliative/hospice interventions at length.  She understands available treatments are not curative, and are intended just to prolong life.  Treatments will not lead to improvement in functional status or quality of life, just prolongation.  The stroke, along with lack of tumor resection and deep brain location, extensive vasculopathy, prior exposure to radiotherapy, are all unfavorable prognostic markers in his case.  Treatment pathway, if selected, would involve intensity modulated radiation therapy and concurrent daily Temozolomide.  Radiation would be administered Mon-Fri over 6 weeks, Temodar would be dosed at 39m/m2 to be given daily over 42 days.  We reviewed side effects of temodar, including fatigue, nausea/vomiting, constipation, and  cytopenias.  Chemotherapy would be held for the following:  ANC less than 1,000  Platelets less than 100,000  LFT or creatinine greater than 2x ULN  If clinical concerns/contraindications develop  Adam Sullivan will discuss with his family preferred pathway forward regarding overall goals of care.  They will meet tomorrow with Dr. Isidore Moos to learn more about radiation options.  For symmetric leg edema, we recommended course of lasix 25m daily until burden of symptoms is improved.  We will expect to hear from them soon regarding their treatment pathway preference.   All questions were answered. The patient  knows to call the clinic with any problems, questions or concerns. No barriers to learning were detected.  The total time spent in the encounter was 60 minutes and more than 50% was on counseling and review of test results   ZVentura Sellers MD Medical Director of Neuro-[facility]07/25/22 2:37 PM

## 2021-07-23 NOTE — Progress Notes (Signed)
Location/Histology of Brain Tumor:  Glioblastoma with isocitrate dehydrogenase gene wildtype  06/28/2021 FINAL MICROSCOPIC DIAGNOSIS:  A. BRAIN, RIGHT CAUDATE LESION, BIOPSY:  - High-grade glioma most consistent with glioblastoma, IDH-wild-type, WHO grade 4. See comment  B. BRAIN, RIGHT CAUDATE LESION, BIOPSY:  - High-grade glioma most consistent with glioblastoma, IDH-wild-type, WHO grade 4. See comment   Patient presented with symptoms of:  06/27/2021 office note from Dr. Duffy Rhody: presents for evaluation of multiple brain lesions.  In 1993, he underwent resection of a pineal cytoma by Dr. Kathlen Brunswick at [place].  Unfortunately, postoperatively, he suffered a large hemorrhage which required emergent surgery and he had a difficult recovery.  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  He had a intraoperative shunt placed by Dr. Delice Lesch and followed up with Dr. Hal Neer.  He underwent postoperative radiation in [place], but the wife does not know the details of the radiation.  He underwent placement of a right frontal shunt, but developed a hemorrhage postoperatively that required emergency surgery.  He then required another surgery for additional hematoma evacuation and shunt removal.  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  During these postoperative bleeding episodes, he was evaluated but a hematologist and was diagnosed with a bleeding disorder, though his wife does not recall what the the name of it is.  He has had chronic difficulties neurologically, but the wife is noted more recently that he has had worsening shuffling gait, sleepiness, and memory difficulties.  She notes that he has little energy and usually sits in front of the TV and falls asleep on the couch.  He had neurologic workup which included MRI which showed enhancing lesions in his right caudate, right basal ganglia, and right  pons.  CT Angio of Head/Neck w/ & w/o Contrast 07/07/2021 IMPRESSION: --CT head: 1. Mildly increased edema associated with the acute left anterior temporal lobe infarct. Local sulcal effacement without midline shift. 2. No substantial change in right caudate and right basal ganglia lesions and small focus of right pontine encephalomalacia, better characterized on yesterday's MRI. 3. Similar additional chronic findings, as described above. --CTA head:  1. Severe stenosis versus short-segment occlusion of the distal left M1 MCA. 2. Severe stenosis versus occlusion of the right ICA terminus. Irregular opacification of a severely narrowed right M1 MCA with opacification of proximal M2 MCA branches. Small, irregular right A1 ACA as well. 3. Motion limited evaluation with very small vertebrobasilar system with bilateral posterior communicating arteries largely supplying the PCAs. Poor visualization of a small right intradural vertebral artery and only minimal opacification of a very small basilar artery. Focal loss of opacification of the distal left vertebral artery may represent a superimposed severe stenosis or occlusion. The small left vertebral artery appears to largely terminate as PICA. 4. Suspected multifocal moderate to severe stenosis of the small left PCA and severe stenosis of the distal right P2 PCA. 5. Moderate bilateral paraclinoid ICA stenosis. --CTA neck: 1. Essentially nondiagnostic evaluation of the proximal vertebral arteries due to motion and beam hardening artifact with suspected severe stenosis of the left vertebral artery origin. More distal vertebral arteries are patent in the neck. 2. No significant (greater than 50%) stenosis of the carotid arteries in the neck.  MRI Brain w/ & w/o Contrast 07/06/2021 IMPRESSION: 1. The hypoattenuation in the left temporal lobe seen on same day CT head correlates with nonenhancing restricted diffusion, compatible with acute infarct.  Associated edema and local sulcal  effacement. Additional small nonenhancing focus of restricted diffusion in the right paramidline pons likely represents an additional acute infarct 2. Increased size/bulk of expansile, heterogeneously enhancing masses in the right caudate and smaller lesion in the adjacent posterior limb of the internal capsule, as described above. 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 Atypical infection is considered less likely. Recommend correlation with pathology results of the recent biopsy. 3. The previously seen area of enhancement in the right pons has resolved with focal T2 hyperintensity in this region. Given the apparent resolution of enhancement, this area may have previously represented an enhancing subacute infarct, but this warrants continued attention on follow-up.  Past or anticipated interventions, if any, per neurosurgery:  06/28/2021 Dr. Duffy Rhody --STEREOTACTIC FRONTAL BIOPSY OF CAUDATE LESION --APPLICATION OF CRANIAL NAVIGATION  Past or anticipated interventions, if any, per medical oncology:  Under care of Dr. Cecil Cobbs 07/23/2021 --Treatment pathway, if selected, would involve intensity modulated radiation therapy and concurrent daily Temozolomide.   --Radiation would be administered Mon-Fri over 6 weeks, Temodar would be dosed at 28m/m2 to be given daily over 42 days.   --We reviewed side effects of temodar, including fatigue, nausea/vomiting, constipation, and cytopenias --For symmetric leg edema, we recommended course of lasix 245mdaily until burden of symptoms is improved. --We will expect to hear from them soon regarding their treatment pathway preference.   Dose of Decadron, if applicable: N/A  Recent neurologic symptoms, if any:  Seizures: Nothing definitively diagnosed; significant other does report that minor seizure like  activity was noted in ED during work-up for stroke. She states Dr. VaMickeal Skinnerid not deem them concerning/worrisome (patient no longer taking Keppra) Headaches: Not currently, but reports history of intense headaches that he would manage with aspirin and gabapentin (if asprin wasn't effective) Nausea: Patient denies Dizziness/ataxia: Reports feeling off balance when he stands to ambulate. Presents to clinic with a walking stick Difficulty with hand coordination: Patient denies, but significant other reports difficulty buckling seatbelt and that handwriting has become less discernable  Focal numbness/weakness: Reports weakness to his lower extremities. Had out patient PT/OT evaluation and was told his strength was diminished in legs, but not too far off from baseline. Often shuffles when walking because of difficulty picking up feet Visual deficits/changes: Has double vision as a result of brain surgery from 1993. Wears prismed glasses to compensate, but if he has to read text, he takes glasses off and closes one eye Confusion/Memory deficits: Has history of difficulty with short term memory. Since recent stroke has difficultly finding words/expressing thoughts, as well as understanding others when talking to him  SAFETY ISSUES: Prior radiation? Yes: 1993/94--Pineal mass resection, VP shunt placed.  Post-operative radiation administered Pacemaker/ICD? No Possible current pregnancy? N/A Is the patient on methotrexate? No  Additional Complaints / other details: Patient has received the first 3 Pf[employer] vaccines

## 2021-07-23 NOTE — Progress Notes (Signed)
Radiation Oncology         [phone] ________________________________  Initial Outpatient Consultation  Name: Adam Sullivan MRN: [id number]  Date: 07/24/2021  DOB: August 07, 1956  OF:BPZWC, Claudina Lick, MD  Vallarie Mare, MD   REFERRING PHYSICIAN: Vallarie Mare, MD  DIAGNOSIS:    ICD-10-CM   1. 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[place])  C71.9 Consult to spiritual care    2. Malignant neoplasm of cerebrum, except lobes and ventricles ([facility])  C71.0      Brain Lesion: high-grade glioma; most consistent with glioblastoma, IDH-wild-type, WHO grade 4    CHIEF COMPLAINT: Here to discuss management of brain cancer  HISTORY OF PRESENT ILLNESS::Adam Sullivan is a 65 y.o. male who initially presented with acute memory loss with behavioral changes to Dr. Quay Burow [facility] on 05/21/21. The patients family reported noticing the patient to have odd/ unusual answers to questions, impaired short term memory, and inattention to his own physical appearance and cleanliness. Upon assessment, the patient has double vision and slurred speech at baseline. CT of the head taken on 05/23/21 revealed interval development of a probable 2.5 x 1.6 cm solid mass adjacent to the right frontal horn. MRI of the brain taken on 05/24/21 further revealed: 2 cm enhancing mass in the right caudate region to be concerning for neoplasm. Additionally, some smaller enhancing lesions were seen in the right internal capsule and pons notably concerning for additional sites of neoplasm or infection.   Accordingly; Dr. Quay Burow referred the patient to Dr. Mickeal Skinner for evaluation on 05/31/21. Neurologic exam performed indicated the patient to be positive for elements of agnosia, poor insight, vertical diplopia, and left eye ptotic (droop). Dr. Mickeal Skinner noted the patient's presenting etiology to be indicative of likely primary CNS neoplasm based off of imaging; the patient was accordingly urgently referred to  neurosurgery for stereotactic biopsy. Of note: Dr. Mickeal Skinner connected with the patient via telephone on 06/05/21 in which new information regarding the patients history of pineal mass and treatment in 1993 was discussed. At the time of resection, he developed severe post-operative hemorrhage, which then recurred following placement of shunt catheter. He underwent extensive hematologic workup at the time, but there are no records of this unfortunately.  Although he did receive radiation (at [facility]) it is unclear the type of RT or the dose and coverage delivered.  The patient's wife believes that he experienced patchy hair loss along his temples bilaterally when he underwent radiation.  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 Dr. Mickeal Skinner noted that he met with Dr. Marcello Moores who expressed hesitation in regards to a brain biopsy due to the patients history.   Of note: CT of the C/A/P taken on 06/11/21 showed no findings suggestive of primary neoplasm or metastatic disease.  On 06/28/21, the patient underwent stereotactic frontal biopsy of the right caudate lesion under Dr. Marcello Moores. Surgical pathology revealed: High-grade glioma; most consistent with glioblastoma, IDH-wild-type, WHO grade 4.  CT of the head taken on 06/29/21 showed no adverse features post-biopsy. Although, the heterogenous mass-like density at the right caudate and basal ganglia appeared notably increased in size since CT taken on 05/23/21; concerning for tumor progression.   Unfortunately, the patient soon after presented to the [facility] ED on 07/06/21 with increased confusion, weakness, and increased gait difficulty. Head CT and MRI taken during ED course revealed: acute CVA; MRI showed anterior left temporal lobe acute/subacute infarction and well as an increase in size of the right caudate lesion, now  measuring 2.9 x 2.0, previously measuring 2.5 x 1.5 cm. The enhancement was also noted to extend into the adjacent frontal white matter  superiorly. Additionally, there was a mild increase in size of the expansile, peripherally enhancing lesion in the posterior limb of the right internal capsule, now measuring approximately 1.5 cm , previously measuring 1.2 cm.  CT and CT angiography of the head and neck on 07/07/21 demonstrated:  --CT head: no substantial change in the right caudate and right basal ganglia lesions, and a small focus of right pontine encephalomalacia. Also seen was some mild increased edema due to left anterior lobe CVA. --CT angio head: severe stenosis versus short-segment occlusion of the distal left M1 MCA, and severe stenosis versus occlusion of the right ICA terminus. Irregular opacification of M1 MCA and proximal M2 MCA were seen, as well as irregularity of the right A1 ACA. (Various other cerebral artery irregularities were seen; reference CTA of head for full impression description).   They have met with Dr. Mickeal Skinner earlier earlier this week and discussed chemoradiation with [facility].  Hospice was also discussed.  The patient and his wife are seriously considering hospice given his performance status and concerns about his ability to tolerate aggressive therapy, side effects, while recovering from a stroke.  Quality of life is very important to them.  PREVIOUS RADIATION THERAPY: Yes, post-operative radiation therapy given in the early 90's  PAST MEDICAL HISTORY:  has a past medical history of Allergy, Anxiety, Arthritis, Asthma, Blood clots in brain, Brain tumor ([place]) (12/31/1991), Depression, Dyspnea, GERD (gastroesophageal reflux disease), Headache(784.0), Hyperlipidemia, Hypothyroidism, Orchalgia, Pneumonia, Pre-diabetes, Prostate cancer ([place]) (07/01/2013), and Thyroid disease.    PAST SURGICAL HISTORY: Past Surgical History:  Procedure Laterality Date   APPLICATION OF CRANIAL NAVIGATION Right 06/28/2021   Procedure: APPLICATION OF CRANIAL NAVIGATION;  Surgeon: Vallarie Mare, MD;  Location: [place];   Service: Neurosurgery;  Laterality: Right;   brain shunt  12/31/1991   put in, removed, put in again   [place] Right 06/28/2021   Procedure: STEREOTACTIC FRONTAL BIOPSY OF CAUDATE LESION;  Surgeon: Vallarie Mare, MD;  Location: [place];  Service: Neurosurgery;  Laterality: Right;   LYMPHADENECTOMY Bilateral 10/22/2013   Procedure: LYMPHADENECTOMY;  Surgeon: Alexis Frock, MD;  Location: [facility];  Service: Urology;  Laterality: Bilateral;   removal brain tumor  12/31/1991   pineal tumor, benign   removed blood clot     X 2   ROBOT ASSISTED LAPAROSCOPIC RADICAL PROSTATECTOMY N/A 10/22/2013   Procedure: ROBOTIC ASSISTED LAPAROSCOPIC RADICAL PROSTATECTOMY   PRE- PERITONEAL APPROACH;  Surgeon: Alexis Frock, MD;  Location: [facility];  Service: Urology;  Laterality: N/A;  3.5 HRS     FAMILY HISTORY: family history includes Asthma in his maternal uncle; Cancer in his brother and father; Coronary artery disease (age of onset: 35) in his mother; Testicular cancer in his father.  SOCIAL HISTORY:  reports that he has never smoked. He has never used smokeless tobacco. He reports current alcohol use. He reports that he does not use drugs.  ALLERGIES: Chocolate, Dust mite extract, Erythromycin, Kola nut, and Other  MEDICATIONS:  Current Outpatient Medications  Medication Sig Dispense Refill   ADVAIR DISKUS 250-50 MCG/DOSE AEPB USE 1 PUFF EVERY 12 HOURS. 60 each 0   albuterol (PROAIR HFA) 108 (90 Base) MCG/ACT inhaler Inhale 2 puffs into the lungs every 6 (six) hours as needed for shortness  of breath or wheezing.     aspirin EC 81 MG EC tablet Take 1 tablet (81 mg total) by mouth daily. Swallow whole. 90 tablet 1   cetirizine (ZYRTEC) 10 MG tablet Take 10 mg by mouth in the morning.     clopidogrel (PLAVIX) 75 MG tablet Take 1 tablet (75 mg total) by mouth daily. 90 tablet 0   ezetimibe-simvastatin (VYTORIN) 10-40 MG tablet Take 1  tablet by mouth at bedtime. 90 tablet 1   furosemide (LASIX) 20 MG tablet Take 1 tablet (20 mg total) by mouth daily. 60 tablet 1   hydrochlorothiazide (HYDRODIURIL) 25 MG tablet Take 1 tablet (25 mg total) by mouth daily. 90 tablet 1   metFORMIN (GLUCOPHAGE) 500 MG tablet Take 1 tablet (500 mg total) by mouth daily with breakfast for 15 days, THEN 1 tablet (500 mg total) 2 (two) times daily with a meal. 165 tablet 0   montelukast (SINGULAIR) 10 MG tablet Take 1 tablet (10 mg total) by mouth at bedtime. 90 tablet 1   Multiple Vitamins-Minerals (CENTRUM SILVER 50+MEN PO) Take 1 tablet by mouth every evening.     pantoprazole (PROTONIX) 40 MG tablet Take 1 tablet (40 mg total) by mouth daily. 90 tablet 1   SYNTHROID 75 MCG tablet Take 1 tablet (75 mcg total) by mouth daily. 90 tablet 1   vitamin B-12 (CYANOCOBALAMIN) 500 MCG tablet Take 500 mcg by mouth every evening.     No current facility-administered medications for this encounter.    REVIEW OF SYSTEMS:  Notable for that above.   PHYSICAL EXAM:  height is 5' 6"  (1.676 m) and weight is 197 lb (89.4 kg). His temperature is 97.8 F (36.6 C). His blood pressure is 176/86 (abnormal) and his pulse is 81. His respiration is 20 and oxygen saturation is 99%.   General: He is sitting in a chair, conversant and pleasant to speak with, frequently nodding off to sleep HEENT: Head is normocephalic. Extraocular movements are intact. Oropharynx is clear. Musculoskeletal: Moderate weakness throughout all of his extremities though he is able to push and pull against limited resistance Neurologic: Cranial nerves II through XII are grossly intact.  Speech is fluent. Coordination is intact.  Vision is grossly blurred but he is able to compensate.  Psychiatric: Limited insight, somnolent  KPS =  40  100 - Normal; no complaints; no evidence of disease. 90   - Able to carry on normal activity; minor signs or symptoms of disease. 80   - Normal activity with  effort; some signs or symptoms of disease. 21   - Cares for self; unable to carry on normal activity or to do active work. 60   - Requires occasional assistance, but is able to care for most of his personal needs. 50   - Requires considerable assistance and frequent medical care. 34   - Disabled; requires special care and assistance. 66   - Severely disabled; hospital admission is indicated although death not imminent. 55   - Very sick; hospital admission necessary; active supportive treatment necessary. 10   - Moribund; fatal processes progressing rapidly. 0     - Dead  Karnofsky DA, Abelmann WH, Craver LS and Burchenal [facility] [phone]) The use of the nitrogen mustards in the palliative treatment of carcinoma: with particular reference to bronchogenic carcinoma Cancer 1 [id number]    LABORATORY DATA:  Lab Results  Component Value Date   WBC 12.8 (H) 07/08/2021   HGB 12.2 (L) 07/08/2021   HCT  35.1 (L) 07/08/2021   MCV 84.6 07/08/2021   PLT 254 07/08/2021   CMP     Component Value Date/Time   NA 131 (L) 07/08/2021 0314   K 4.2 07/08/2021 0314   CL 97 (L) 07/08/2021 0314   CO2 28 07/08/2021 0314   GLUCOSE 149 (H) 07/08/2021 0314   BUN 27 (H) 07/08/2021 0314   CREATININE 0.88 07/08/2021 0314   CALCIUM 8.2 (L) 07/08/2021 0314   PROT 5.8 (L) 07/06/2021 1221   ALBUMIN 2.6 (L) 07/08/2021 0314   AST 28 07/06/2021 1221   ALT 84 (H) 07/06/2021 1221   ALKPHOS 46 07/06/2021 1221   BILITOT 0.7 07/06/2021 1221   GFRNONAA >60 07/08/2021 0314   GFRAA >90 10/23/2013 0520         RADIOGRAPHY: CT ANGIO HEAD NECK W WO CM  Result Date: 07/07/2021 CLINICAL DATA:  Stroke follow-up. EXAM: CT ANGIOGRAPHY HEAD AND NECK TECHNIQUE: Multidetector CT imaging of the head and neck was performed using the standard protocol during bolus administration of intravenous contrast. Multiplanar CT image reconstructions and MIPs were obtained to evaluate the vascular anatomy. Carotid stenosis measurements (when  applicable) are obtained utilizing NASCET criteria, using the distal internal carotid diameter as the denominator. CONTRAST:  179m OMNIPAQUE IOHEXOL 350 MG/ML SOLN COMPARISON:  None. FINDINGS: CT HEAD FINDINGS Brain: Mildly increased edema associated with the acute left anterior temporal lobe infarct. Local sulcal effacement without midline shift. No substantial change in right caudate and right basal ganglia lesions and small focus of right pontine encephalomalacia, better characterized on yesterday's MRI. Similar position of a right parietal approach ventriculostomy catheter which terminates in the atrium right lateral ventricle. Similar position of an additional midline occipital approach catheter which terminates between the frontal horns of the lateral ventricles. No evidence of hydrocephalus. No definite acute hemorrhage. No evidence of interval large vascular territory infarct. Vascular: See below. Skull: Mild scattered paranasal sinus mucosal thickening. Sinuses: Mild mucosal thickening.  No acute orbital findings. Orbits: No mastoid effusions. Review of the MIP images confirms the above findings CTA NECK FINDINGS Motion limited evaluation. Aortic arch: Aortic atherosclerosis. Great vessel origins are patent. Right carotid system: Motion limited evaluation without evidence of significant (greater than 50%) stenosis. Mild calcific and noncalcific atherosclerosis at the carotid bifurcation. Left carotid system: Motion limited evaluation without evidence of greater than 50% stenosis. Mild to moderate calcific and noncalcific atherosclerosis at the carotid bifurcation Vertebral arteries: Mildly right dominant. Essentially nondiagnostic evaluation of the origins/proximal vertebral arteries due to motion and beam hardening artifact with suspected severe stenosis of the left vertebral artery origin. More distal vertebral arteries are patent in the neck. Skeleton: Multilevel severe facet arthropathy. Other neck: No  acute abnormality. Upper chest: Visualized lung apices are clear. Review of the MIP images confirms the above findings CTA HEAD FINDINGS Motion limited evaluation. Anterior circulation: Calcific atherosclerosis of bilateral cavernous and paraclinoid ICAs with suspected moderate bilateral paraclinoid ICA stenosis. Severe stenosis versus occlusion of the right ICA terminus. Irregular opacification of a severely narrowed right M1 MCA with opacification of proximal M2 MCA branches. Small, irregular right A1 ACA. The left A1 ACA and visualized proximal A2 ACA branches are patent. Posterior circulation: Motion limited evaluation with very small vertebrobasilar system with bilateral posterior communicating arteries largely supplying the PCAs. Poor visualization of a small right intradural vertebral artery and only minimal opacification of a small basilar artery. Focal loss of opacification of the distal left vertebral artery may represent a superimposed stenosis. The small  left vertebral artery appears to largely terminate as PICA. Suspected multifocal moderate to severe stenosis of the small left PCA and severe stenosis of the distal right P2 PCA. Venous sinuses: As permitted by contrast timing, patent. Review of the MIP images confirms the above findings IMPRESSION: CT head: 1. Mildly increased edema associated with the acute left anterior temporal lobe infarct. Local sulcal effacement without midline shift. 2. No substantial change in right caudate and right basal ganglia lesions and small focus of right pontine encephalomalacia, better characterized on yesterday's MRI. 3. Similar additional chronic findings, as described above. CTA head: 1. Severe stenosis versus short-segment occlusion of the distal left M1 MCA. 2. Severe stenosis versus occlusion of the right ICA terminus. Irregular opacification of a severely narrowed right M1 MCA with opacification of proximal M2 MCA branches. Small, irregular right A1 ACA as well.  3. Motion limited evaluation with very small vertebrobasilar system with bilateral posterior communicating arteries largely supplying the PCAs. Poor visualization of a small right intradural vertebral artery and only minimal opacification of a very small basilar artery. Focal loss of opacification of the distal left vertebral artery may represent a superimposed severe stenosis or occlusion. The small left vertebral artery appears to largely terminate as PICA. 4. Suspected multifocal moderate to severe stenosis of the small left PCA and severe stenosis of the distal right P2 PCA. 5. Moderate bilateral paraclinoid ICA stenosis. CTA neck: 1. Essentially nondiagnostic evaluation of the proximal vertebral arteries due to motion and beam hardening artifact with suspected severe stenosis of the left vertebral artery origin. More distal vertebral arteries are patent in the neck. 2. No significant (greater than 50%) stenosis of the carotid arteries in the neck. Findings discussed with Dr. Erlinda Hong at 11:29 a.m. via telephone. Electronically Signed   By: Margaretha Sheffield MD   On: 07/07/2021 12:04   DG Chest 2 View  Result Date: 07/06/2021 CLINICAL DATA:  Altered mental status, confusion for 24 hours EXAM: CHEST - 2 VIEW COMPARISON:  10/20/2013 FINDINGS: Enlargement of cardiac silhouette. Mediastinal contours and pulmonary vascularity normal. Lungs clear. No infiltrate, pleural effusion, or pneumothorax. Bones demineralized. IMPRESSION: No acute abnormalities. Enlargement of cardiac silhouette. Electronically Signed   By: Lavonia Dana M.D.   On: 07/06/2021 13:06   CT HEAD WO CONTRAST  Result Date: 07/06/2021 CLINICAL DATA:  Altered mental status. EXAM: CT HEAD WITHOUT CONTRAST TECHNIQUE: Contiguous axial images were obtained from the base of the skull through the vertex without intravenous contrast. COMPARISON:  06/29/2021. FINDINGS: Brain: New hypodensity and loss of gray-white differentiation in the anterior left temporal  lobe, most likely an acute or subacute infarct. Interval resolution of the small volume of pneumocephalus seen on the prior. Redemonstrated hyperdense right caudate mass which appears similar in size in comparison to recent CT head from 06/29/2021. Linear hypodensity adjacent to the mass likely is a sequela recent biopsy. Mild regional mass effect is also similar. No convincing acute/interval hemorrhage. Heterogeneous hyperdensity in the basal ganglia likely reflects tumor when comparing to prior MRI. Similar small hypodensity in the right pons. Similar position of a right parietal approach ventriculostomy catheter which terminates in the atrium right lateral ventricle. Low position of additional midline occipital approach catheter which terminates between the frontal horns of the lateral ventricles. No evidence of hydrocephalus. Old ventriculostomy catheter tract is noted in the right frontal lobe. Vascular: Calcific atherosclerosis. No hyperdense vessel identified. Skull: Right frontal craniotomy. Right-sided burr hole. Suboccipital craniectomy. Sinuses/Orbits: Mild paranasal sinus mucosal thickening. No acute  orbital findings. Other: No mastoid effusions. IMPRESSION: 1. New hypodensity and loss of gray-white differentiation in the anterior left temporal lobe, most likely an acute or subacute infarct. Recommend MRI with contrast to confirm and to exclude other etiologies. 2. In comparison to recent CT head from June 29, 2021, no substantial change in the masslike densities in the right caudate and basal ganglia, further evaluated on prior MRI. 3. 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 Electronically Signed   By: Margaretha Sheffield MD   On: 07/06/2021 14:26   CT HEAD WO CONTRAST  Result Date: 06/29/2021 CLINICAL DATA:  65 year old male with history of ventriculoperitoneal shunt. Multifocal enhancing brain lesions on MRI in May, most notably the right caudate nucleus. Postoperative day 1  status post stereotactic caudate lesion biopsy. EXAM: CT HEAD WITHOUT CONTRAST TECHNIQUE: Contiguous axial images were obtained from the base of the skull through the vertex without intravenous contrast. COMPARISON:  Brain MRI 05/24/2021 and earlier. FINDINGS: Brain: Small volume pneumocephalus, including along a biopsy tract from the right superior frontal bone to the region of the hyperdense right caudate mass (series 2, image 22). The mass appears larger by CT since 05/23/2021, about 30 mm now versus 24 mm at that time. Mild regional mass effect is not significantly changed. No convincing acute intracranial hemorrhage identified. Heterogeneous density in the right basal ganglia appears to be tumor related as on the MRI (series 2, image 19) and is more conspicuous since the CT that month. No midline shift. Stable basilar cisterns. No ventriculomegaly. Chronic right posterior and midline suboccipital shunt catheters. Stable gray-white matter differentiation in the left hemisphere and posterior fossa, including stable CT appearance of the right pons. No cortically based acute infarct identified. Vascular: Extensive calcified atherosclerosis at the skull base. Skull: Chronic suboccipital craniectomy. Remote right frontotemporal craniotomy. Chronic right posterior convexity burr hole. Small new right frontal bone postoperative burr hole. No other acute intracranial abnormality. Sinuses/Orbits: Visualized paranasal sinuses and mastoids are stable and well aerated. Other: Subtle postoperative changes at the right scalp vertex. Chronic right lateral convexity shunt reservoir and tubing appears stable. Negative orbits. IMPRESSION: 1. No adverse features status post frontal approach stereotactic biopsy of the right caudate. 2. Heterogeneous mass-like density at the right caudate and basal ganglia does appear increased by CT since 05/23/2021 suggesting progression of tumor. 3. Stable chronic ventriculostomy shunts. No new  intracranial abnormality. Electronically Signed   By: Genevie Ann M.D.   On: 06/29/2021 06:55   MR Brain W and Wo Contrast  Result Date: 07/06/2021 CLINICAL DATA:  Neuro deficit, acute stroke suspected. EXAM: MRI HEAD WITHOUT AND WITH CONTRAST TECHNIQUE: Multiplanar, multiecho pulse sequences of the brain and surrounding structures were obtained without and with intravenous contrast. CONTRAST:  54mL GADAVIST GADOBUTROL 1 MMOL/ML IV SOLN COMPARISON:  MRI May 24, 2021. FINDINGS: Brain: The hypoattenuation in the left temporal lobe seen on same day CT head correlates with nonenhancing restricted diffusion, most consistent with acute infarct.Associated edema and local sulcal effacement. Increased size of expansile T2/FLAIR hyperintensity within the right caudate with increasingly heterogeneous enhancement, now measuring 2.9 x 2.0 cm on series 9, image 14, previously 2.5 x 1.5 cm when remeasured. Enhancement abuts and is inseparable from the frontal horn of the right lateral ventricle, separate. Heterogeneous enhancement also extends into adjacent frontal white matter superiorly. Additionally, mildly increased size of the expansile, peripherally enhancing lesion in the posterior limb of the right internal capsule, now measuring approximately 1.5 cm on series 9, image 12, previously  1.2 cm when remeasured. Both these areas demonstrate restricted diffusion, suggestive of hypercellularity. The previously seen enhancement in the right pons has resolved with small focus of T2 hyperintensity in this region. Similar chronic hemorrhagic lacunar infarcts in bilateral basal ganglia and chronic microhemorrhages in the thalami and elsewhere in the cerebral and cerebellum, as well as the brainstem. Right parietal approach ventriculostomy catheter is again noted a terminating in the atrium of the right lateral ventricle. Additionally, there is a midline ventriculostomy catheter which terminates in similar position. No hydrocephalus.  Remote ventriculostomy catheter tract in the right frontal lobe, similar. No extra-axial fluid collection.  No midline shift. Vascular: Major arterial flow voids are maintained at the skull base. Small vertebrobasilar system, similar to prior. Skull and upper cervical spine: Normal marrow signal. Sinuses/Orbits: Mild sinus mucosal thickening.  Unremarkable orbits. IMPRESSION: 1. The hypoattenuation in the left temporal lobe seen on same day CT head correlates with nonenhancing restricted diffusion, compatible with acute infarct. Associated edema and local sulcal effacement. Additional small nonenhancing focus of restricted diffusion in the right paramidline pons likely represents an additional acute infarct 2. Increased size/bulk of expansile, heterogeneously enhancing masses in the right caudate and smaller lesion in the adjacent posterior limb of the internal capsule, as described above. 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 Atypical infection is considered less likely. Recommend correlation with pathology results of the recent biopsy. 3. The previously seen area of enhancement in the right pons has resolved with focal T2 hyperintensity in this region. Given the apparent resolution of enhancement, this area may have previously represented an enhancing subacute infarct, but this warrants continued attention on follow-up. Findings discussed with Dr. Vanita Panda at 7:18 p.m. via telephone. Electronically Signed   By: Margaretha Sheffield MD   On: 07/06/2021 19:38   EEG adult  Result Date: 07/07/2021 Lora Havens, MD     07/07/2021  8:47 AM Patient Name: BASHIR MARCHETTI MRN: [id number] Epilepsy Attending: Lora Havens Referring Physician/Provider: Dr Donnetta Simpers Date: 79/2022 Duration: 20.29 mins Patient history:  80M with brain biopsy of lesion concerning for malignancy a week ago who presents with  intermittent mild aphasia/confusion. EEG to evaluate for seizure. Level of alertness: Awake AEDs during EEG study: LEV Technical aspects: This EEG study was done with scalp electrodes positioned according to the 10-20 International system of electrode placement. Electrical activity was acquired at a sampling rate of 500Hz  and reviewed with a high frequency filter of 70Hz  and a low frequency filter of 1Hz . EEG data were recorded continuously and digitally stored. Description: The posterior dominant rhythm consists of 8Hz  activity of moderate voltage (25-35 uV) seen predominantly in posterior head regions, symmetric and reactive to eye opening and eye closing. EEG showed continuous generalized left > right frontal 3 to 6 Hz theta-delta slowing.  Hyperventilation and photic stimulation were not performed.   ABNORMALITY - Continuous slow,left>right frontal region IMPRESSION: This study is suggestive of cortical dysfunction arising from left> right frontal region likely secondary to underlying structural abnormality. No seizures or definite epileptiform discharges were seen throughout the recording. If suspicion for ictal-interictal activity remains a concern, a prolonged study can be considered. Lora Havens   ECHOCARDIOGRAM COMPLETE BUBBLE STUDY  Result Date: 07/07/2021    ECHOCARDIOGRAM REPORT   Patient Name:   CLINE DRAHEIM Date of Exam: 07/07/2021 Medical Rec #:  [id number]    Height:  66.0 in Accession #:    [id number]   Weight:       185.2 lb Date of Birth:  06-Nov-1956     BSA:          1.935 m Patient Age:    23 years     BP:           153/71 mmHg Patient Gender: M            HR:           50 bpm. Exam Location:  Inpatient Procedure: 2D Echo, Cardiac Doppler, Color Doppler, Saline Contrast Bubble Study            and Intracardiac Opacification Agent Indications:    Stroke 434.91 / I63.9  History:        Patient has no prior history of Echocardiogram examinations.                 Signs/Symptoms:Dyspnea;  Risk Factors:Hypertension. GERD.  Sonographer:    Vickie Epley RDCS Referring Phys: [id number] [facility]  Sonographer Comments: Suboptimal parasternal window. IMPRESSIONS  1. Left ventricular ejection fraction, by estimation, is 55 to 60%. The left ventricle has normal function. The left ventricle has no regional wall motion abnormalities. Left ventricular diastolic parameters were normal.  2. Right ventricular systolic function is normal. The right ventricular size is normal. Tricuspid regurgitation signal is inadequate for assessing PA pressure.  3. Left atrial size was moderately dilated.  4. The mitral valve is normal in structure. Trivial mitral valve regurgitation. No evidence of mitral stenosis.  5. The aortic valve was not well visualized. Aortic valve regurgitation is mild. No aortic stenosis is present.  6. The inferior vena cava is normal in size with greater than 50% respiratory variability, suggesting right atrial pressure of 3 mmHg.  7. Agitated saline contrast bubble study was negative, with no evidence of any interatrial shunt. FINDINGS  Left Ventricle: No left ventricular thrombus witth Definity. Left ventricular ejection fraction, by estimation, is 55 to 60%. The left ventricle has normal function. The left ventricle has no regional wall motion abnormalities. Definity contrast agent was given IV to delineate the left ventricular endocardial borders. The left ventricular internal cavity size was normal in size. There is no left ventricular hypertrophy. Left ventricular diastolic parameters were normal. Indeterminate filling pressures. Right Ventricle: The right ventricular size is normal. No increase in right ventricular wall thickness. Right ventricular systolic function is normal. Tricuspid regurgitation signal is inadequate for assessing PA pressure. Left Atrium: Left atrial size was moderately dilated. Right Atrium: Right atrial size was normal in size. Pericardium: There is no evidence of  pericardial effusion. Mitral Valve: The mitral valve is normal in structure. Trivial mitral valve regurgitation. No evidence of mitral valve stenosis. Tricuspid Valve: The tricuspid valve is normal in structure. Tricuspid valve regurgitation is not demonstrated. Aortic Valve: The aortic valve was not well visualized. Aortic valve regurgitation is mild. No aortic stenosis is present. Pulmonic Valve: The pulmonic valve was grossly normal. Pulmonic valve regurgitation is not visualized. Aorta: The aortic root was not well visualized. Venous: The inferior vena cava is normal in size with greater than 50% respiratory variability, suggesting right atrial pressure of 3 mmHg. IAS/Shunts: No atrial level shunt detected by color flow Doppler. Agitated saline contrast was given intravenously to evaluate for intracardiac shunting. Agitated saline contrast bubble study was negative, with no evidence of any interatrial shunt.  LEFT VENTRICLE PLAX 2D LVIDd:  5.50 cm      Diastology LVIDs:         4.50 cm      LV e' medial:    6.31 cm/s LV PW:         0.90 cm      LV E/e' medial:  16.3 LV IVS:        0.90 cm      LV e' lateral:   9.03 cm/s LVOT diam:     2.40 cm      LV E/e' lateral: 11.4 LV SV:         126 LV SV Index:   65 LVOT Area:     4.52 cm  LV Volumes (MOD) LV vol d, MOD A2C: 152.0 ml LV vol d, MOD A4C: 118.0 ml LV vol s, MOD A2C: 52.1 ml LV vol s, MOD A4C: 60.0 ml LV SV MOD A2C:     99.9 ml LV SV MOD A4C:     118.0 ml LV SV MOD BP:      84.3 ml RIGHT VENTRICLE RV S prime:     14.30 cm/s TAPSE (M-mode): 2.4 cm LEFT ATRIUM             Index       RIGHT ATRIUM           Index LA diam:        4.60 cm 2.38 cm/m  RA Area:     11.70 cm LA Vol (A2C):   59.8 ml 30.90 ml/m RA Volume:   23.80 ml  12.30 ml/m LA Vol (A4C):   77.2 ml 39.89 ml/m LA Biplane Vol: 68.8 ml 35.55 ml/m  AORTIC VALVE LVOT Vmax:   98.20 cm/s LVOT Vmean:  67.200 cm/s LVOT VTI:    0.278 m MITRAL VALVE MV Area (PHT): 3.20 cm     SHUNTS MV Decel Time:  237 msec     Systemic VTI:  0.28 m MV E velocity: 103.00 cm/s  Systemic Diam: 2.40 cm MV A velocity: 80.00 cm/s MV E/A ratio:  1.29 Mihai Croitoru MD Electronically signed by Sanda Klein MD Signature Date/Time: 07/07/2021/2:22:40 PM    Final    VAS [place] LOWER EXTREMITY VENOUS (DVT)  Result Date: 07/07/2021  Lower Venous DVT Study Patient Name:  GAVYN YBARRA  Date of Exam:   07/07/2021 Medical Rec #: [id number]     Accession #:    [id number] Date of Birth: 1956/12/25      Patient Gender: M Patient Age:   65Y Exam Location:  [facility] Procedure:      VAS [place] LOWER EXTREMITY VENOUS (DVT) Referring Phys: [id number] Rosalin Hawking --------------------------------------------------------------------------------  Indications: Stroke.  Comparison Study: no prior Performing Technologist: Archie Patten RVS  Examination Guidelines: A complete evaluation includes B-mode imaging, spectral Doppler, color Doppler, and power Doppler as needed of all accessible portions of each vessel. Bilateral testing is considered an integral part of a complete examination. Limited examinations for reoccurring indications may be performed as noted. The reflux portion of the exam is performed with the patient in reverse Trendelenburg.  +---------+---------------+---------+-----------+----------+--------------+ RIGHT    CompressibilityPhasicitySpontaneityPropertiesThrombus Aging +---------+---------------+---------+-----------+----------+--------------+ CFV      Full           Yes      Yes                                 +---------+---------------+---------+-----------+----------+--------------+ SFJ  Full                                                        +---------+---------------+---------+-----------+----------+--------------+ FV Prox  Full                                                        +---------+---------------+---------+-----------+----------+--------------+ FV Mid   Full                                                         +---------+---------------+---------+-----------+----------+--------------+ FV DistalFull                                                        +---------+---------------+---------+-----------+----------+--------------+ PFV      Full                                                        +---------+---------------+---------+-----------+----------+--------------+ POP      Full           Yes      Yes                                 +---------+---------------+---------+-----------+----------+--------------+ PTV      Full                                                        +---------+---------------+---------+-----------+----------+--------------+ PERO     Full                                                        +---------+---------------+---------+-----------+----------+--------------+   +---------+---------------+---------+-----------+----------+--------------+ LEFT     CompressibilityPhasicitySpontaneityPropertiesThrombus Aging +---------+---------------+---------+-----------+----------+--------------+ CFV      Full           Yes      Yes                                 +---------+---------------+---------+-----------+----------+--------------+ SFJ      Full                                                        +---------+---------------+---------+-----------+----------+--------------+  FV Prox  Full                                                        +---------+---------------+---------+-----------+----------+--------------+ FV Mid   Full                                                        +---------+---------------+---------+-----------+----------+--------------+ FV DistalFull                                                        +---------+---------------+---------+-----------+----------+--------------+ PFV      Full                                                         +---------+---------------+---------+-----------+----------+--------------+ POP      Full           Yes      Yes                                 +---------+---------------+---------+-----------+----------+--------------+ PTV      Full                                                        +---------+---------------+---------+-----------+----------+--------------+ PERO     Full                                                        +---------+---------------+---------+-----------+----------+--------------+     Summary: BILATERAL: - No evidence of deep vein thrombosis seen in the lower extremities, bilaterally. -No evidence of popliteal cyst, bilaterally.   *See table(s) above for measurements and observations. Electronically signed by Jamelle Haring on 07/07/2021 at 4:40:31 PM.    Final       IMPRESSION/PLAN: This is a wonderful 65 year old gentleman, retired from [employer] system, and an avid reader a Arts administrator fiction.  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 Today, I talked to the patient about the findings and work-up thus far.  We discussed the patient's diagnosis of unresectable glioblastoma and general treatment for this, highlighting the role of radiotherapy in the management.  We discussed the available radiation techniques, and focused on the details of logistics and delivery.     We discussed the risks, benefits, and side effects of radiotherapy. Side effects may include but not necessarily be limited to: Skin irritation,  hair loss, profound fatigue, headaches, cognitive decline, brain injury; no guarantees of treatment were given.  We discussed the alternative of best supportive care through hospice.  Given his poor performance status and poor prognosis, I think that hospice is a very reasonable option for him to pursue.  The patient and his wife appear to be leaning in this  direction.  They have our contact information if they decide to pursue treatment.  His CT simulation will be canceled today.  I informed Dr. Mickeal Skinner of his status.  I have also referred the patient and his wife to spiritual care for additional emotional support at this very difficult time.   On date of service, in total, I spent 45 minutes on this encounter. Patient was seen in person.  __________________________________________   Eppie Gibson, MD  This document serves as a record of services personally performed by Eppie Gibson, MD. It was created on her behalf by Roney Mans, a trained medical scribe. The creation of this record is based on the scribe's personal observations and the provider's statements to them. This document has been checked and approved by the attending provider.

## 2021-07-24 ENCOUNTER — Ambulatory Visit: Payer: 59 | Admitting: Radiation Oncology

## 2021-07-24 ENCOUNTER — Ambulatory Visit
Admission: RE | Admit: 2021-07-24 | Discharge: 2021-07-24 | Disposition: A | Discharge status: Home / Self Care | Payer: 59 | Source: Ambulatory Visit | Attending: Radiation Oncology | Admitting: Radiation Oncology

## 2021-07-24 VITALS — BP 176/86 | HR 81 | Temp 97.8°F | Resp 20 | Ht 66.0 in | Wt 197.0 lb

## 2021-07-24 DIAGNOSIS — K219 Gastro-esophageal reflux disease without esophagitis: Secondary | ICD-10-CM | POA: Diagnosis not present

## 2021-07-24 DIAGNOSIS — C710 Malignant neoplasm of cerebrum, except lobes and ventricles: Secondary | ICD-10-CM | POA: Insufficient documentation

## 2021-07-24 DIAGNOSIS — I082 Rheumatic disorders of both aortic and tricuspid valves: Secondary | ICD-10-CM | POA: Insufficient documentation

## 2021-07-24 DIAGNOSIS — Z7982 Long term (current) use of aspirin: Secondary | ICD-10-CM | POA: Diagnosis not present

## 2021-07-24 DIAGNOSIS — E039 Hypothyroidism, unspecified: Secondary | ICD-10-CM | POA: Insufficient documentation

## 2021-07-24 DIAGNOSIS — Z809 Family history of malignant neoplasm, unspecified: Secondary | ICD-10-CM | POA: Insufficient documentation

## 2021-07-24 DIAGNOSIS — C719 Malignant neoplasm of brain, unspecified: Secondary | ICD-10-CM

## 2021-07-24 DIAGNOSIS — E785 Hyperlipidemia, unspecified: Secondary | ICD-10-CM | POA: Insufficient documentation

## 2021-07-24 DIAGNOSIS — M129 Arthropathy, unspecified: Secondary | ICD-10-CM | POA: Diagnosis not present

## 2021-07-24 DIAGNOSIS — J45909 Unspecified asthma, uncomplicated: Secondary | ICD-10-CM | POA: Diagnosis not present

## 2021-07-24 DIAGNOSIS — Z79899 Other long term (current) drug therapy: Secondary | ICD-10-CM | POA: Insufficient documentation

## 2021-07-26 ENCOUNTER — Encounter: Payer: Self-pay | Admitting: General Practice

## 2021-07-26 NOTE — Progress Notes (Signed)
[facility] Spiritual Care Note  Referred by Dr Isidore Moos as additional layer of support. Reached Adam Sullivan' wife Adam Sullivan by phone, providing introduction to services, empathic listening, normalization of feelings, and emotional support. Ensured that she has my direct-dial number and plan to follow up with her in ca two weeks, after she has had some family visiting and discernment time.   [place], [place], [facility] Pager [phone] Voicemail [phone]

## 2021-07-27 ENCOUNTER — Encounter: Payer: Self-pay | Admitting: Radiation Oncology

## 2021-07-31 ENCOUNTER — Encounter: Payer: Self-pay | Admitting: Occupational Therapy

## 2021-07-31 ENCOUNTER — Ambulatory Visit: Payer: 59 | Attending: Neurosurgery | Admitting: Occupational Therapy

## 2021-07-31 ENCOUNTER — Other Ambulatory Visit: Payer: Self-pay

## 2021-07-31 ENCOUNTER — Ambulatory Visit: Payer: 59

## 2021-07-31 ENCOUNTER — Other Ambulatory Visit: Payer: Self-pay | Admitting: *Deleted

## 2021-07-31 DIAGNOSIS — R41841 Cognitive communication deficit: Secondary | ICD-10-CM

## 2021-07-31 DIAGNOSIS — R600 Localized edema: Secondary | ICD-10-CM | POA: Diagnosis present

## 2021-07-31 DIAGNOSIS — R4701 Aphasia: Secondary | ICD-10-CM | POA: Insufficient documentation

## 2021-07-31 DIAGNOSIS — R471 Dysarthria and anarthria: Secondary | ICD-10-CM | POA: Insufficient documentation

## 2021-07-31 DIAGNOSIS — M6281 Muscle weakness (generalized): Secondary | ICD-10-CM | POA: Insufficient documentation

## 2021-07-31 DIAGNOSIS — R41840 Attention and concentration deficit: Secondary | ICD-10-CM | POA: Insufficient documentation

## 2021-07-31 DIAGNOSIS — C719 Malignant neoplasm of brain, unspecified: Secondary | ICD-10-CM

## 2021-07-31 NOTE — Progress Notes (Signed)
Called patients wife to see if they have any further questions following their visits with Dr Mickeal Skinner and Dr Isidore Moos.  She stated that they have discussed in great detail how they wont to enjoy the last months/days of Adam Sullivan's life and what measures seem appropriate to them.   They have decided to not pursue treatment for his brain tumor.  They do want to continue to receive standard care from PCP and emergency care and just want to pursue Palliative care at this time and travel as much as they can.  They will let Palliative decide when the appropriate time for transfer into Hospice would be.   They do want Dr Mickeal Skinner to be the attending and follow his case.   Order for Palliative added and [facility] faxed.

## 2021-07-31 NOTE — Patient Instructions (Addendum)
  AAC apps: -Small Talk  -Go Talk  -Talk to Me 100 -Symbo Talk -Sounding board

## 2021-07-31 NOTE — Therapy (Signed)
[facility] [address] [place], [place], [address] Phone: [phone]   Fax:  [phone]  Speech Language Pathology Treatment/Discharge Summary  Patient Details  Name: Adam Sullivan MRN: [id number] Date of Birth: [birth year] Referring Provider (SLP): Lorin Glass (referring); Dr. Sherrilee Gilles  (documentation)   Encounter Date: 07/31/2021   End of Session - 07/31/21 1255     Visit Number 2    Number of Visits 25    Date for SLP Re-Evaluation 10/16/21    SLP Start Time 77    SLP Stop Time  1400    SLP Time Calculation (min) 40 min    Activity Tolerance Patient tolerated treatment well             Past Medical History:  Diagnosis Date   Allergy    Anxiety    Arthritis    Asthma    severe, Dr. Linna Darner   Blood clots in brain    history of    Brain tumor ([place]) 12/31/1991   Depression    situational   Dyspnea    GERD (gastroesophageal reflux disease)    severe   Headache(784.0)    Hyperlipidemia    Hypothyroidism    Orchalgia    Pneumonia    hx of   Pre-diabetes    Prostate cancer ([place]) 07/01/2013   gleason 6   Thyroid disease    hypothyroidism    Past Surgical History:  Procedure Laterality Date   APPLICATION OF CRANIAL NAVIGATION Right 06/28/2021   Procedure: APPLICATION OF CRANIAL NAVIGATION;  Surgeon: Vallarie Mare, MD;  Location: [place];  Service: Neurosurgery;  Laterality: Right;   brain shunt  12/31/1991   put in, removed, put in again   [place] Right 06/28/2021   Procedure: STEREOTACTIC FRONTAL BIOPSY OF CAUDATE LESION;  Surgeon: Vallarie Mare, MD;  Location: [place];  Service: Neurosurgery;  Laterality: Right;   LYMPHADENECTOMY Bilateral 10/22/2013   Procedure: LYMPHADENECTOMY;  Surgeon: Alexis Frock, MD;  Location: [facility];  Service: Urology;  Laterality: Bilateral;   removal brain tumor  12/31/1991   pineal tumor,  benign   removed blood clot     X 2   ROBOT ASSISTED LAPAROSCOPIC RADICAL PROSTATECTOMY N/A 10/22/2013   Procedure: ROBOTIC ASSISTED LAPAROSCOPIC RADICAL PROSTATECTOMY   PRE- PERITONEAL APPROACH;  Surgeon: Alexis Frock, MD;  Location: [facility];  Service: Urology;  Laterality: N/A;  3.5 HRS     There were no vitals filed for this visit.   Subjective Assessment - 07/31/21 1400     Subjective Pt and family electing for therapy discharge due to prognosis    Patient is accompained by: Family member    Currently in Pain? No/denies             SPEECH THERAPY DISCHARGE SUMMARY  Visits from Start of Care: 2  Current functional level related to goals / functional outcomes: Heath Lark and his family requested ST discharge this date due to recent prognosis update and decision to forgo intensive medical intervention. SLP educated wife on tasks and aids to maximize current and future cognitive communication levels, including AAC. Pt and wife verbalized understanding and agreement with ST discharge per patient request.    Remaining deficits: Cognitive deficits, aphasia    Education / Equipment: Communication aids, functional cognitive communication tasks for home    Patient agrees to discharge. Patient goals were not met.  Patient is being discharged due to the patient's request..         ADULT SLP TREATMENT - 07/31/21 1254       General Information   Behavior/Cognition Alert;Cooperative;Pleasant mood      Treatment Provided   Treatment provided Cognitive-Linquistic      Cognitive-Linquistic Treatment   Treatment focused on Cognition;Aphasia    Skilled Treatment Pt's family is electing to discharge from all therapies at this time due to recent prognosis and desire to forgo intensive medical intervention. Pt's wife requested possible AAC apps to use in future as communication declines, in which SLP provided handout with options. SLP also provided additional recommendations to aid  current cognitive commuication skills, in which both pt and his wife verbalized understanding.      Assessment / Recommendations / Plan   Plan Discharge SLP treatment due to (comment)   family request due to prognosis     Progression Toward Goals   Progression toward goals Goals not met, education completed, patient discharged from [facility] Education - 07/31/21 1404     Education Details AAC options, cognitive communication recommendations    Person(s) Educated Patient;Spouse    Methods Explanation;Handout;Demonstration    Comprehension Verbalized understanding;Returned demonstration              SLP Short Term Goals - 07/31/21 1255       SLP SHORT TERM GOAL #1   Title pt will demonstrate sustained attention adequate for 5 minute therapy task x4, x3 sessions    Time 4    Period Weeks    Status Not Met    Target Date 08/17/21      SLP SHORT TERM GOAL #2   Title pt will demonstrate he knows where to place papers in a memory binder with occasional min A x3 sessions.    Time 4    Period Weeks    Status Not Met    Target Date 08/17/21      SLP SHORT TERM GOAL #3   Title pt will complee formal cognitive evaluation    Time 2    Period Weeks    Status Not Met    Target Date 08/03/21      SLP SHORT TERM GOAL #4   Title pt will imitate oral motor tasks to strengthen/coordinate articulators and improve speech clarity 80% success in 3 sessions    Time 6    Period Weeks    Status Not Met    Target Date 08/29/21              SLP Long Term Goals - 07/31/21 1255       SLP LONG TERM GOAL #1   Title pt will demonstrate simple alternating atteniton to read off items on a list or check off items on a list x2 sessions    Time 8    Period Weeks    Status Not Met    Target Date 09/14/21      SLP LONG TERM GOAL #2   Title pt will tell SLP 2 cognitive deficits with modified independence (notes in memory binder)    Time 12    Period Weeks    Status Not  Met    Target Date 10/16/21      SLP LONG TERM GOAL #3   Title pt will chart medication, exercise completion, etc in memory binder with occasional min A  Time 12    Period Weeks    Status Not Met    Target Date 10/16/21              Plan - 07/31/21 1256     Clinical Impression Statement Heath Lark presents today with cognitive deficits in attention, awareness, mild dysarthria causing listener to pay closer attention to speaker, and also receptive and expressive aphasia including word salad and language of confusion. Family has elected to discharge from all therapies at this time due to recent prognosis update and decisions to forgo further intensive treatment. SLP provided recommendations for cognitve communication s/p ST discharge. Both patient and wife verbalized understanding and agreement with ST discharge this date.    Speech Therapy Frequency 2x / week    Duration 12 weeks    Treatment/Interventions Cognitive reorganization;Internal/external aids;Patient/family education;Compensatory strategies;SLP instruction and feedback;Cueing hierarchy;Functional tasks;Compensatory techniques;Environmental controls;Oral motor exercises    Potential to Achieve Goals Fair    Potential Considerations Medical prognosis;Previous level of function    Consulted and Agree with Plan of Care Patient;Family member/caregiver    Family Member Consulted wife Magda Paganini             Patient will benefit from skilled therapeutic intervention in order to improve the following deficits and impairments:   Aphasia  Dysarthria and anarthria  Cognitive communication deficit    Problem List Patient Active Problem List   Diagnosis Date Noted   Malignant neoplasm of cerebrum, except lobes and ventricles ([place]) 07/24/2021   Goals of care, counseling/discussion 07/23/2021   Acute CVA (cerebrovascular accident) ([place]) 07/07/2021   CVA (cerebral vascular accident) ([place]) 07/06/2021   Brain lesion 06/28/2021    Right frontal lobe lesion 06/28/2021   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[place]) 05/31/2021   History of CVA (cerebrovascular accident) 03/08/2020   Fatigue 02/10/2020   Memory loss 02/10/2020   Depression 02/10/2020   Tinea cruris 01/28/2020   Left shoulder tendinitis 10/13/2018   Gait abnormality 09/21/2018   Poor short-term memory 09/21/2018   Drooping eyelid disease, left 09/21/2018   History of prostate cancer 09/23/2016   Essential hypertension 09/23/2016   Diabetes mellitus without complication ([place]) 16/09/9603   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/2011   GERD (gastroesophageal reflux disease) 06/04/2011   Dyslipidemia 06/04/2011   Headache disorder 06/04/2011   Hypothyroidism 03/18/2008   Asthma 03/18/2008    Alinda Deem, MA CCC-SLP 07/31/2021, 2:06 PM  [facility] [address] [place], [place], [address] Phone: [phone]   Fax:  [phone]   Name: AMAD MAU MRN: [id number] Date of Birth: 04-Dec-1956

## 2021-07-31 NOTE — Therapy (Signed)
[facility] [address] [place], [place], [address] Phone: [phone]   Fax:  [phone]  Occupational Therapy Treatment and Discharge Summary  Patient Details  Name: Adam Sullivan MRN: [id number] Date of Birth: [birth year] Referring Provider (OT): Leonie Man   Encounter Date: 07/31/2021   OT End of Session - 07/31/21 1341     Visit Number 2    Number of Visits 13    Date for OT Re-Evaluation 10/01/21    Authorization Type UHC - 60 VL; Combined    OT Start Time 7412    OT Stop Time 8786    OT Time Calculation (min) 40 min    Activity Tolerance Patient tolerated treatment well    Behavior During Therapy --   lethargic            Past Medical History:  Diagnosis Date   Allergy    Anxiety    Arthritis    Asthma    severe, Dr. Linna Darner   Blood clots in brain    history of    Brain tumor ([facility]) 12/31/1991   Depression    situational   Dyspnea    GERD (gastroesophageal reflux disease)    severe   Headache(784.0)    Hyperlipidemia    Hypothyroidism    Orchalgia    Pneumonia    hx of   Pre-diabetes    Prostate cancer ([facility]) 07/01/2013   gleason 6   Thyroid disease    hypothyroidism    Past Surgical History:  Procedure Laterality Date   APPLICATION OF CRANIAL NAVIGATION Right 06/28/2021   Procedure: APPLICATION OF CRANIAL NAVIGATION;  Surgeon: Vallarie Mare, MD;  Location: [facility];  Service: Neurosurgery;  Laterality: Right;   brain shunt  12/31/1991   put in, removed, put in again   Long Right 06/28/2021   Procedure: STEREOTACTIC FRONTAL BIOPSY OF CAUDATE LESION;  Surgeon: Vallarie Mare, MD;  Location: [facility];  Service: Neurosurgery;  Laterality: Right;   LYMPHADENECTOMY Bilateral 10/22/2013   Procedure: LYMPHADENECTOMY;  Surgeon: Alexis Frock, MD;  Location: [facility];  Service: Urology;  Laterality: Bilateral;   removal brain  tumor  12/31/1991   pineal tumor, benign   removed blood clot     X 2   ROBOT ASSISTED LAPAROSCOPIC RADICAL PROSTATECTOMY N/A 10/22/2013   Procedure: ROBOTIC ASSISTED LAPAROSCOPIC RADICAL PROSTATECTOMY   PRE- PERITONEAL APPROACH;  Surgeon: Alexis Frock, MD;  Location: [facility];  Service: Urology;  Laterality: N/A;  3.5 HRS     There were no vitals filed for this visit.   Subjective Assessment - 07/31/21 1331     Subjective  Patient and wife have decided to pursue palliative care.    Patient is accompanied by: Family member    Currently in Pain? No/denies    Pain Score 0-No pain                          OT Treatments/Exercises (OP) - 07/31/21 0001       ADLs   Toileting Patient has had several incontinent episodes while travelling and could not locate family restrooms or working handicapped accessible bathrooms.  Recommended getting a portable urinal.    ADL Comments Had long conversation with wife and patient regarding next steps.  They want to use the remaining time together to travel, and plan fun things while  he can still enjoy that.  They are opting to not pursue chemo, radiation - patient is not a surgical candidate.  Disussed next steps relating to equipment, support, counseling.  Patient has tub transfer bench - working for now.  Has wheeld walker at home from family member, looking for lift chair, discussed that palliative care case manager/team could assist with wheelchair, ramp, hopsital bed when needed.                    OT Education - 07/31/21 1341     Education Details portable urinal, future equipment - hospital bed, wheelchair, ramp, supportive care and counseling    Person(s) Educated Patient;Spouse    Methods Explanation    Comprehension Verbalized understanding              OT Short Term Goals - 07/31/21 1347       OT SHORT TERM GOAL #1   Title Patient will safely shower self with supervision and verbal cueing    Time 4     Period Weeks    Status Unable to assess      OT SHORT TERM GOAL #2   Title Patient will complete a cold snack prep with supervision and cueing    Time 4    Period Weeks    Status Unable to assess               OT Long Term Goals - 07/31/21 1347       OT LONG TERM GOAL #1   Title Patient will complete an HEP designed to improve grip strength in BUE    Time 6    Period Weeks    Status Not Met      OT LONG TERM GOAL #2   Title Patient will identify need to toilet, and need no more than min assistance    Time 6    Period Weeks    Status Not Met      OT LONG TERM GOAL #3   Title Patient will follow schedule of functional activity followed by rest time to maximize activity tolerance and attention    Time 6    Period Weeks    Status Not Met      OT LONG TERM GOAL #4   Title Patient will dress his lower body with no more than min assist    Time 6    Period Weeks    Status Not Met                   Plan - 07/31/21 1342     Clinical Impression Statement Patient and his wife are opting to stop OP therapy at this time and will pursue supportive services of palliative care.  Discussed potential equipment needs as conditions progresses.    OT Occupational Profile and History Detailed Assessment- Review of Records and additional review of physical, cognitive, psychosocial history related to current functional performance    Occupational performance deficits (Please refer to evaluation for details): ADL's;IADL's;Leisure    Body Structure / Function / Physical Skills ADL;Continence;Strength;IADL;FMC;Coordination;Balance;Decreased knowledge of precautions;Endurance;Sensation;Mobility;Decreased knowledge of use of DME    Cognitive Skills Attention;Energy/Drive;Memory;Safety Awareness;Problem Solve;Understand    Rehab Potential Fair    Clinical Decision Making Several treatment options, min-mod task modification necessary    Comorbidities Affecting Occupational Performance:  Presence of comorbidities impacting occupational performance    Comorbidities impacting occupational performance description: brain lesion, prior brain surgeries with shunt, diplopia, asthma, edema LE'S  Modification or Assistance to Complete Evaluation  Min-Moderate modification of tasks or assist with assess necessary to complete eval    OT Frequency 2x / week    OT Duration 6 weeks    OT Treatment/Interventions Self-care/ADL training;Therapeutic exercise;DME and/or AE instruction;Functional Mobility Training;Cognitive remediation/compensation;Balance training;Neuromuscular education;Therapeutic activities;Patient/family education;Coping strategies training    Plan d/c    Consulted and Agree with Plan of Care Patient;Family member/caregiver    Family Member Consulted wife Lezlie             Patient will benefit from skilled therapeutic intervention in order to improve the following deficits and impairments:   Body Structure / Function / Physical Skills: ADL, Continence, Strength, IADL, [place], Coordination, Balance, Decreased knowledge of precautions, Endurance, Sensation, Mobility, Decreased knowledge of use of DME Cognitive Skills: Attention, Energy/Drive, Memory, Safety Awareness, Problem Solve, Understand     Visit Diagnosis: Attention and concentration deficit  Muscle weakness (generalized)  Localized edema    Problem List Patient Active Problem List   Diagnosis Date Noted   Malignant neoplasm of cerebrum, except lobes and ventricles ([facility]) 07/24/2021   Goals of care, counseling/discussion 07/23/2021   Acute CVA (cerebrovascular accident) ([facility]) 07/07/2021   CVA (cerebral vascular accident) ([facility]) 07/06/2021   Brain lesion 06/28/2021   Right frontal lobe lesion 06/28/2021   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[facility]) 05/31/2021   History of CVA (cerebrovascular accident) 03/08/2020   Fatigue 02/10/2020   Memory loss 02/10/2020   Depression 02/10/2020    Tinea cruris 01/28/2020   Left shoulder tendinitis 10/13/2018   Gait abnormality 09/21/2018   Poor short-term memory 09/21/2018   Drooping eyelid disease, left 09/21/2018   History of prostate cancer 09/23/2016   Essential hypertension 09/23/2016   Diabetes mellitus without complication ([facility]) 71/27/8718   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/2011   GERD (gastroesophageal reflux disease) 06/04/2011   Dyslipidemia 06/04/2011   Headache disorder 06/04/2011   Hypothyroidism 03/18/2008   Asthma 03/18/2008  OCCUPATIONAL THERAPY DISCHARGE SUMMARY  Visits from Start of Care: 2  Current functional level related to goals / functional outcomes: As per initial evaluation   Remaining deficits: Lethargy, edema, attention, activity tolerance   Education / Equipment: Potential future equipment needs   Patient agrees to discharge. Patient goals were not met. Patient is being discharged due to a change in medical status.Mariah Milling, OTR/L 07/31/2021, 1:48 PM  [facility] [address] [place], [place], [address] Phone: [phone]   Fax:  [phone]  Name: Adam Sullivan MRN: [id number] Date of Birth: June 02, 1956

## 2021-08-01 ENCOUNTER — Telehealth: Payer: Self-pay

## 2021-08-01 NOTE — Telephone Encounter (Signed)
(  4:31 pm)  SW attempted to contact patient/wife to schedule initial palliative care visit. SW received voicemail and left a message requesting a call back.

## 2021-08-02 ENCOUNTER — Ambulatory Visit: Payer: 59 | Admitting: Occupational Therapy

## 2021-08-02 ENCOUNTER — Ambulatory Visit: Payer: 59 | Admitting: Physical Therapy

## 2021-08-07 ENCOUNTER — Ambulatory Visit: Payer: 59 | Admitting: Physical Therapy

## 2021-08-07 ENCOUNTER — Encounter: Payer: 59 | Admitting: Occupational Therapy

## 2021-08-09 ENCOUNTER — Encounter: Payer: 59 | Admitting: Occupational Therapy

## 2021-08-09 ENCOUNTER — Ambulatory Visit: Payer: 59 | Admitting: Physical Therapy

## 2021-08-13 ENCOUNTER — Encounter: Payer: Self-pay | Admitting: General Practice

## 2021-08-13 NOTE — Progress Notes (Signed)
[facility] Spiritual Care Note  Left voicemail of care, encouragement, and ongoing support availability for Robbie's wife Magda Paganini. Encouraged her to return call if it would be helpful to have a conversation partner along the way.   [place], [place], [facility] Pager [phone] Voicemail [phone]

## 2021-08-14 ENCOUNTER — Encounter: Payer: 59 | Admitting: Occupational Therapy

## 2021-08-15 ENCOUNTER — Telehealth: Payer: Self-pay

## 2021-08-15 ENCOUNTER — Other Ambulatory Visit: Payer: Self-pay | Admitting: Internal Medicine

## 2021-08-15 NOTE — Telephone Encounter (Signed)
Faxed in today. 

## 2021-08-15 NOTE — Telephone Encounter (Signed)
1.Medication Requested: Advair  2. Pharmacy (Name, [facility]): [facility]  3. On Med List: Y  4. Last Visit with PCP: 07/11/2021  5. Next visit date with PCP:  It was previously filled by Dr. Forde Dandy    Agent: Please be advised that RX refills may take up to 3 business days. We ask that you follow-up with your pharmacy.

## 2021-08-16 ENCOUNTER — Telehealth: Payer: Self-pay

## 2021-08-16 ENCOUNTER — Ambulatory Visit: Payer: 59

## 2021-08-16 ENCOUNTER — Encounter: Payer: 59 | Admitting: Occupational Therapy

## 2021-08-16 ENCOUNTER — Inpatient Hospital Stay: Payer: 59 | Admitting: Adult Health

## 2021-08-16 NOTE — Telephone Encounter (Signed)
(  3:34 pm) SW returned call to  patient's wife to schedule initial RN/SW palliative visit with patient-message left requesting a call back.

## 2021-08-21 ENCOUNTER — Encounter: Payer: 59 | Admitting: Occupational Therapy

## 2021-08-21 ENCOUNTER — Ambulatory Visit: Payer: 59

## 2021-08-21 ENCOUNTER — Ambulatory Visit: Payer: Self-pay | Admitting: Neurology

## 2021-08-23 ENCOUNTER — Ambulatory Visit: Payer: 59

## 2021-08-23 ENCOUNTER — Encounter: Payer: 59 | Admitting: Occupational Therapy

## 2021-08-27 ENCOUNTER — Telehealth: Payer: Self-pay | Admitting: *Deleted

## 2021-08-27 NOTE — Telephone Encounter (Signed)
Patients wife called to report that patient was out [place] visiting family and fell and hit his head and because he is on a blood thinner they took him to the ER.  CT scan of head was completed to rule out brain bleed which was negative.  She will be dropping off disc today so that Dr Mickeal Skinner can look at it and then we can have it scanned as permanent imaging to his file.

## 2021-08-31 ENCOUNTER — Encounter: Payer: Self-pay | Admitting: Internal Medicine

## 2021-08-31 ENCOUNTER — Telehealth: Payer: Self-pay

## 2021-08-31 NOTE — Telephone Encounter (Signed)
Received call from patient's wife, who wanted an update on Dr Renda Rolls opinion on most recent scans as well as to discuss some current concerns. Called and LVM stating that, per Dr Mickeal Skinner, scans were unchanged from prior. Offered to make appointment with Dr Mickeal Skinner for next week. Will await return phone call.

## 2021-09-04 ENCOUNTER — Telehealth: Payer: Self-pay

## 2021-09-04 NOTE — Telephone Encounter (Signed)
Spoke with patient's wife, Mckenley Hopper, and offered phone visit with Dr. Mickeal Skinner to discuss CT results. Mrs. Lecce agreeable to phone visit. Scheduling message sent.

## 2021-09-06 ENCOUNTER — Inpatient Hospital Stay: Payer: 59 | Attending: Internal Medicine | Admitting: Internal Medicine

## 2021-09-06 DIAGNOSIS — C719 Malignant neoplasm of brain, unspecified: Secondary | ICD-10-CM

## 2021-09-06 DIAGNOSIS — Z7189 Other specified counseling: Secondary | ICD-10-CM

## 2021-09-06 MED ORDER — DEXAMETHASONE 4 MG PO TABS: 4.0000 mg | ORAL_TABLET | Freq: Every day | ORAL | 1 refills | Status: AC

## 2021-09-06 NOTE — Progress Notes (Signed)
I connected with Adam Sullivan on 09/06/21 at 12:00 PM EDT by telephone visit and verified that I am speaking with the correct person using two identifiers.  I discussed the limitations, risks, security and privacy concerns of performing an evaluation and management service by telemedicine and the availability of in-person appointments. I also discussed with the patient that there may be a patient responsible charge related to this service. The patient expressed understanding and agreed to proceed.  Other persons participating in the visit and their role in the encounter:  spouse  Patient's location:  Home  Provider's location:  Office  Chief Complaint:  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[facility])  Goals of care, counseling/discussion  History of Present Ilness: Adam Sullivan describes poor appetite, low energy.  At times he has brief episodes of shaking or trembling of his left leg, though it doesn't bother him much.  Wife describes some difficulty with swallowing, choking with food on occasion.  They have not yet been set up with palliative care or hospice interventions.  Recently traveled to see family, ended up with a fall and in the ED in [place]; CT head was obtained.  Observations: Language and cognition at baseline  Assessment and Plan: 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H[facility])  Goals of care, counseling/discussion  We discussed goals of care again today.  They are agreeable to move forward with home hospice, given the support it would provide for their needs.    For symptoms, recommended resuming decadron at '4mg'$  daily x3 days, then decreasing to '2mg'$  daily thereafter if tolerated, or with side effects at the higher dose.  Follow Up Instructions:  Con't to follow up as needed in person or virtually.  I discussed the assessment and treatment plan with the patient.  The patient was provided an opportunity to ask questions and all were answered.  The  patient agreed with the plan and demonstrated understanding of the instructions.    The patient was advised to call back or seek an in-person evaluation if the symptoms worsen or if the condition fails to improve as anticipated.  I provided 5-10 minutes of non-face-to-face time during this enocunter.  Ventura Sellers, MD   I provided 22 minutes of non face-to-face telephone visit time during this encounter, and > 50% was spent counseling as documented under my assessment & plan.

## 2021-09-07 ENCOUNTER — Telehealth: Payer: Self-pay | Admitting: *Deleted

## 2021-09-07 NOTE — Telephone Encounter (Signed)
Received call from [facility] stating family desires Hospice services instead of Palliative Care.. Dr. Mickeal Skinner to be the attending. Initial visit will be the week of 09/10/21

## 2021-09-13 ENCOUNTER — Telehealth: Payer: Self-pay | Admitting: *Deleted

## 2021-09-13 ENCOUNTER — Inpatient Hospital Stay
Admission: RE | Admit: 2021-09-13 | Discharge: 2021-09-13 | Disposition: A | Discharge status: Home / Self Care | Payer: Self-pay | Source: Ambulatory Visit | Attending: Internal Medicine | Admitting: Internal Medicine

## 2021-09-13 ENCOUNTER — Encounter: Payer: Self-pay | Admitting: Internal Medicine

## 2021-09-13 ENCOUNTER — Ambulatory Visit
Admission: RE | Admit: 2021-09-13 | Discharge: 2021-09-13 | Disposition: A | Discharge status: Home / Self Care | Payer: Self-pay | Source: Ambulatory Visit | Attending: Internal Medicine | Admitting: Internal Medicine

## 2021-09-13 ENCOUNTER — Other Ambulatory Visit (HOSPITAL_COMMUNITY): Payer: Self-pay | Admitting: Internal Medicine

## 2021-09-13 DIAGNOSIS — C801 Malignant (primary) neoplasm, unspecified: Secondary | ICD-10-CM

## 2021-09-13 NOTE — Telephone Encounter (Signed)
Per Dr Mickeal Skinner ok to discontinue plavix and aspirin.

## 2021-09-13 NOTE — Telephone Encounter (Signed)
Received call from [facility] with [facility].  [phone].  She called to question what the patient should do in regards to his Plavix.  Wife states he has been taking that for some time plus Aspirin 81 mg  States they have about 3 pills remaining of Plavix.    Plavix is non formulary for hospice so they want to know what he should take instead of Plavix.  Should he just take the Aspirin 81 mg or take 325 mg or something all together different that might be covered by hospice.  Nurse states that patient is sleeping 18 + hours a day.  Appetite was non existent when he came off of decadron but since being put back on that he has some appetite.  Denies having any pain.  Occasional headaches that are resolved with ibuprofen.  Routed to MD to please advise.

## 2021-10-04 ENCOUNTER — Inpatient Hospital Stay: Payer: 59 | Admitting: Neurology

## 2021-10-10 ENCOUNTER — Ambulatory Visit: Payer: 59 | Admitting: Internal Medicine

## 2021-10-16 ENCOUNTER — Encounter: Payer: Self-pay | Admitting: Internal Medicine

## 2021-10-17 ENCOUNTER — Telehealth: Payer: Self-pay | Admitting: *Deleted

## 2021-10-17 NOTE — Telephone Encounter (Signed)
Contacted by  Ria Comment with [facility] - [phone]. She visited patient today. She states he has significantly declined in last week and appears nearer to end of life. Patient is now totally bed bound and did not speak during her visit. He signed 'yes' to her when asked about pain. Wife states he has c/o headache pain in past few days -  first time in a while he has had headache. Only receiving Ibuprofen for pain. He may need something stronger for pain. Patient is mildly restless and may also need medication for it in next few days.   Informed her that Dr. Mickeal Skinner not in office today but could be contacted for urgent needs. She stated Dr. Mickeal Skinner did not be be contacted today as long as he received the information.  She wants to get his thoughts on patient changes.      Ria Comment states she will contact Dr. Antonieta Loveless for pain medication orders.  Call information routed to Dr. Mickeal Skinner

## 2021-10-25 ENCOUNTER — Encounter: Payer: Self-pay | Admitting: Internal Medicine

## 2021-10-30 DEATH — deceased

## 2022-05-21 IMAGING — DX DG CHEST 2V
2 series · 2 of 2 positions shown · non-contrast
Comparison: 10/20/2013

CLINICAL DATA: Altered mental status, confusion for 24 hours

EXAM:
CHEST - 2 VIEW

[chest pa]
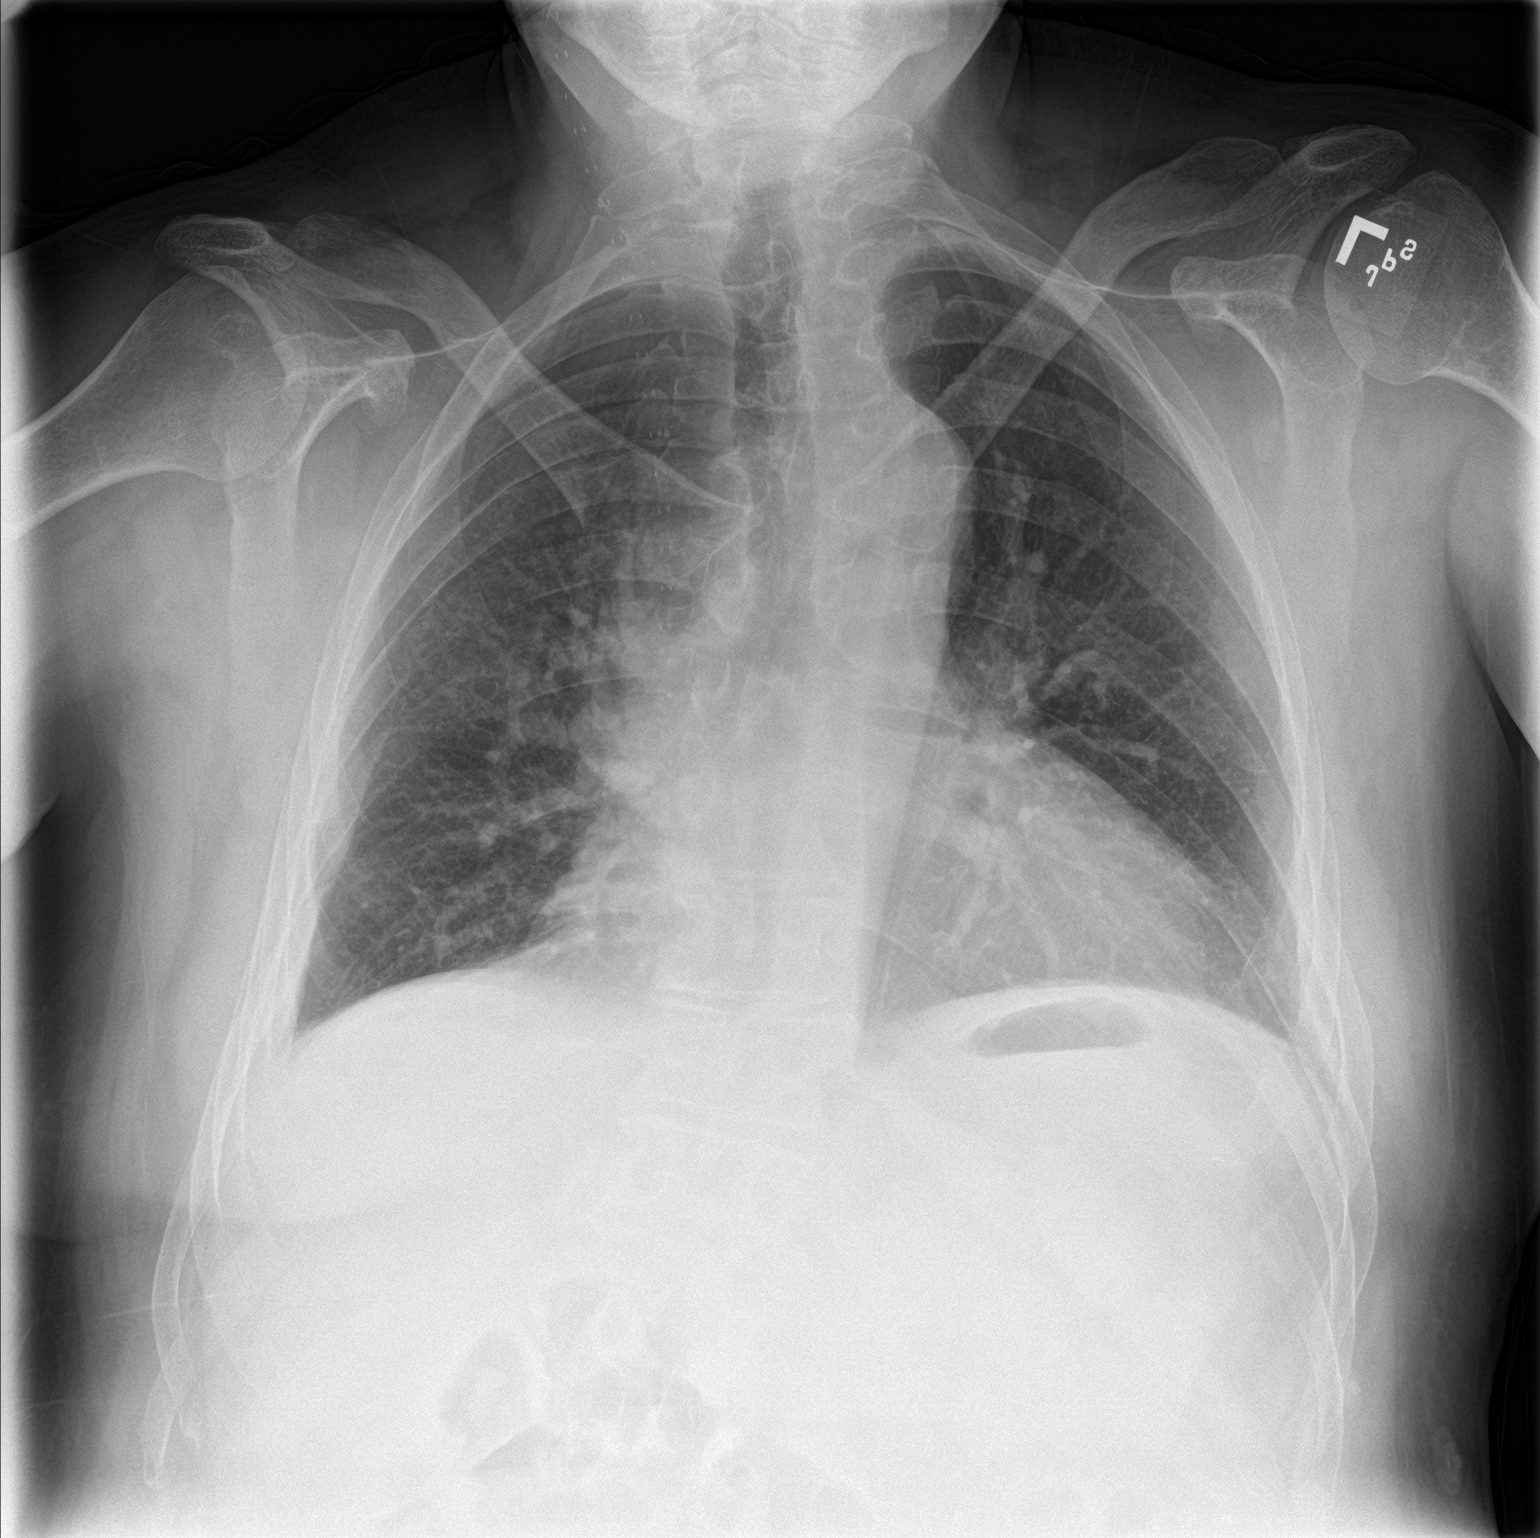

[chest lat]
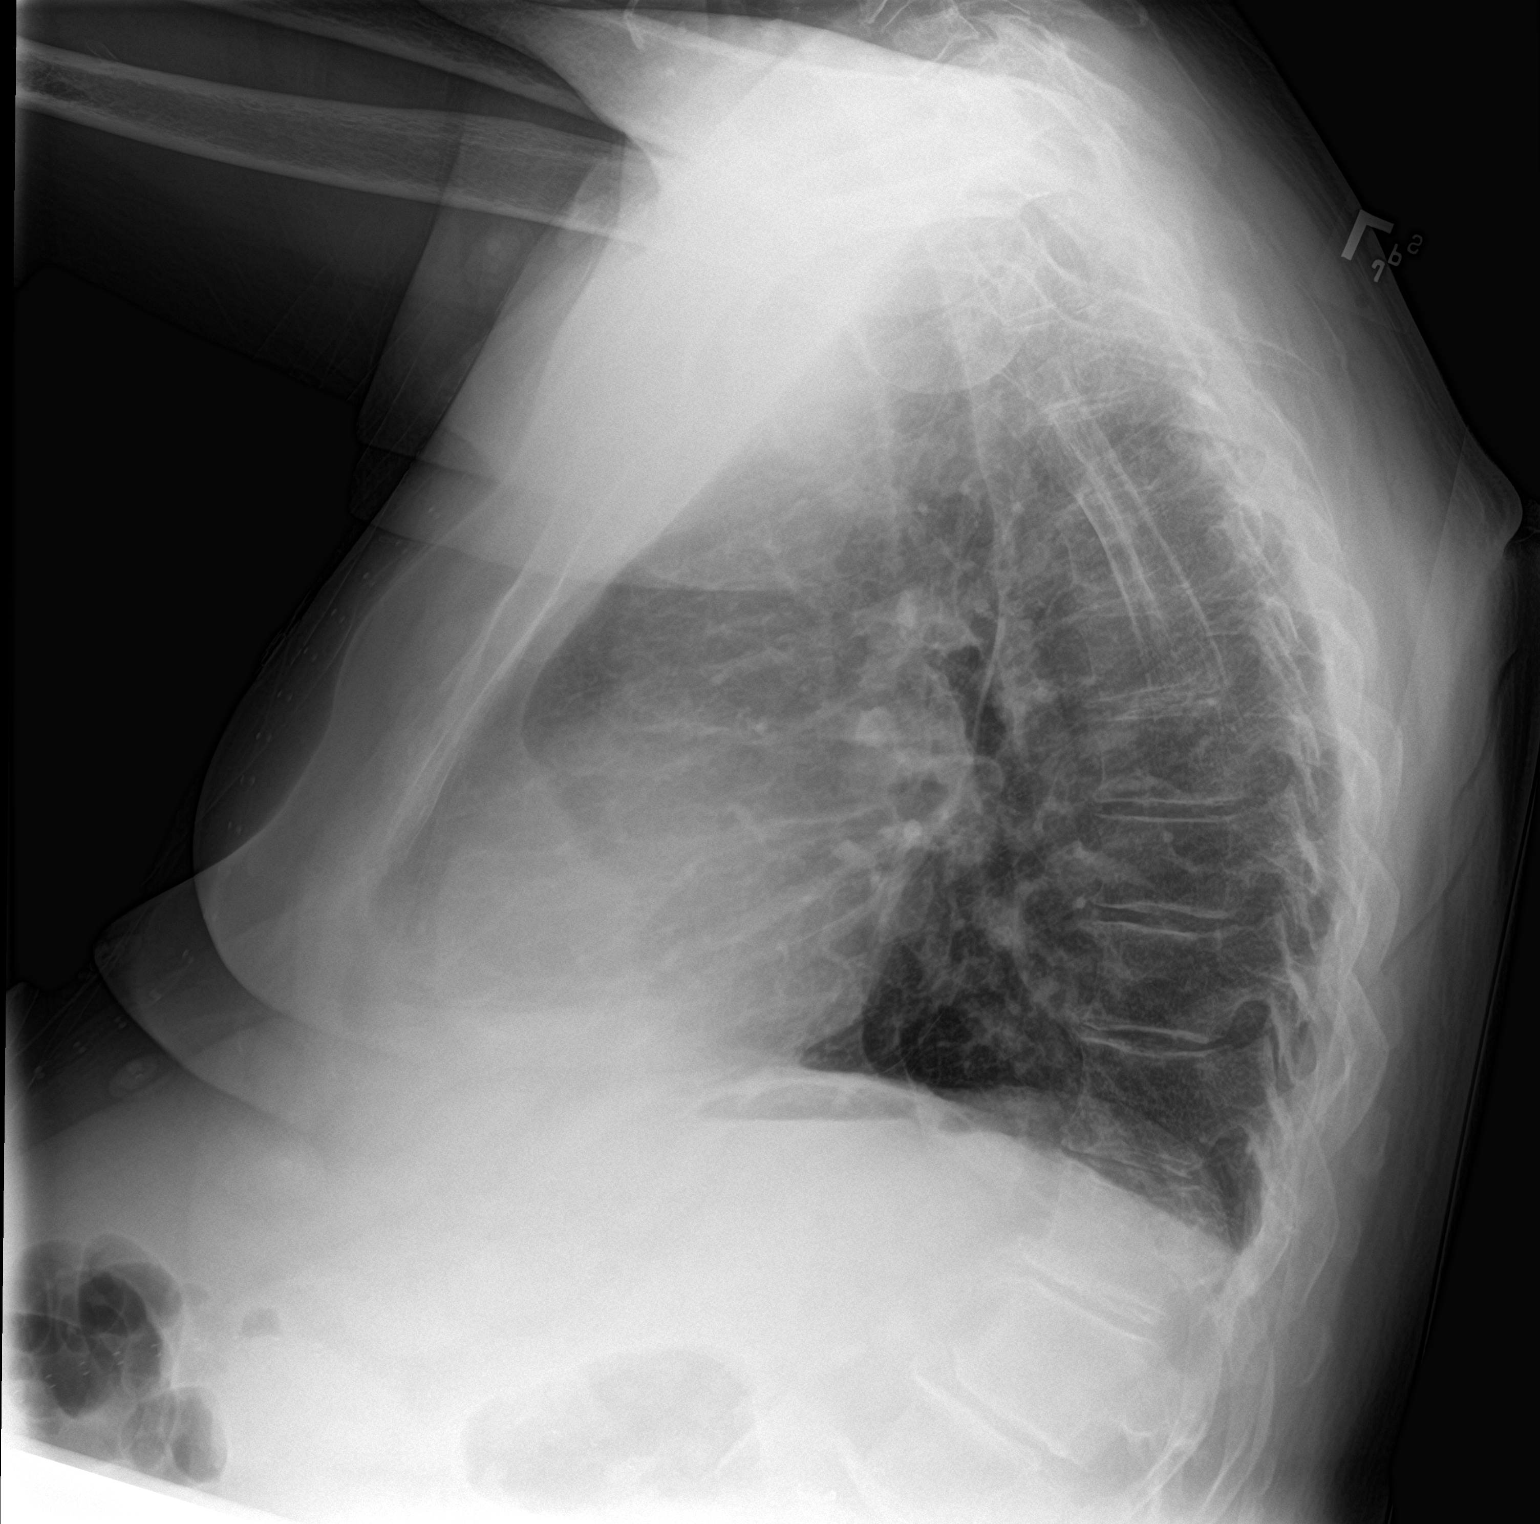

[2 of 2 positions shown; findings below may reference images not displayed]

FINDINGS: Enlargement of cardiac silhouette.

Mediastinal contours and pulmonary vascularity normal.

Lungs clear.

No infiltrate, pleural effusion, or pneumothorax.

Bones demineralized.
IMPRESSION: No acute abnormalities.

Enlargement of cardiac silhouette.
